# Patient Record
Sex: Female | Born: 1960 | Race: White | Hispanic: No | Marital: Married | Smoking: Former smoker
Health system: Southern US, Community
[De-identification: ages and names within clinical notes are randomized; demographics above are authoritative.]

## PROBLEM LIST (undated history)

## (undated) DIAGNOSIS — R809 Proteinuria, unspecified: Secondary | ICD-10-CM

## (undated) DIAGNOSIS — K219 Gastro-esophageal reflux disease without esophagitis: Secondary | ICD-10-CM

## (undated) DIAGNOSIS — Z8719 Personal history of other diseases of the digestive system: Secondary | ICD-10-CM

## (undated) DIAGNOSIS — M1990 Unspecified osteoarthritis, unspecified site: Secondary | ICD-10-CM

## (undated) DIAGNOSIS — K5900 Constipation, unspecified: Secondary | ICD-10-CM

## (undated) DIAGNOSIS — E669 Obesity, unspecified: Secondary | ICD-10-CM

## (undated) DIAGNOSIS — G4733 Obstructive sleep apnea (adult) (pediatric): Secondary | ICD-10-CM

## (undated) DIAGNOSIS — R059 Cough, unspecified: Secondary | ICD-10-CM

## (undated) DIAGNOSIS — R0683 Snoring: Secondary | ICD-10-CM

## (undated) DIAGNOSIS — J069 Acute upper respiratory infection, unspecified: Secondary | ICD-10-CM

## (undated) DIAGNOSIS — T7840XA Allergy, unspecified, initial encounter: Secondary | ICD-10-CM

## (undated) DIAGNOSIS — E1140 Type 2 diabetes mellitus with diabetic neuropathy, unspecified: Secondary | ICD-10-CM

## (undated) DIAGNOSIS — G629 Polyneuropathy, unspecified: Secondary | ICD-10-CM

## (undated) DIAGNOSIS — E785 Hyperlipidemia, unspecified: Secondary | ICD-10-CM

## (undated) DIAGNOSIS — E119 Type 2 diabetes mellitus without complications: Secondary | ICD-10-CM

## (undated) DIAGNOSIS — J029 Acute pharyngitis, unspecified: Secondary | ICD-10-CM

## (undated) DIAGNOSIS — IMO0001 Reserved for inherently not codable concepts without codable children: Secondary | ICD-10-CM

## (undated) DIAGNOSIS — K121 Other forms of stomatitis: Secondary | ICD-10-CM

## (undated) DIAGNOSIS — D649 Anemia, unspecified: Secondary | ICD-10-CM

## (undated) DIAGNOSIS — I878 Other specified disorders of veins: Secondary | ICD-10-CM

## (undated) DIAGNOSIS — J45909 Unspecified asthma, uncomplicated: Secondary | ICD-10-CM

## (undated) DIAGNOSIS — I2510 Atherosclerotic heart disease of native coronary artery without angina pectoris: Secondary | ICD-10-CM

## (undated) DIAGNOSIS — R011 Cardiac murmur, unspecified: Secondary | ICD-10-CM

## (undated) DIAGNOSIS — I10 Essential (primary) hypertension: Secondary | ICD-10-CM

## (undated) HISTORY — PX: TONSILLECTOMY: SUR1361

## (undated) HISTORY — DX: Hyperlipidemia, unspecified: E78.5

## (undated) HISTORY — DX: Allergy, unspecified, initial encounter: T78.40XA

## (undated) HISTORY — DX: Constipation, unspecified: K59.00

## (undated) HISTORY — DX: Other specified disorders of veins: I87.8

## (undated) HISTORY — DX: Other forms of stomatitis: K12.1

## (undated) HISTORY — DX: Obesity, unspecified: E66.9

## (undated) HISTORY — DX: Snoring: R06.83

## (undated) HISTORY — PX: LAPAROSCOPIC NISSEN FUNDOPLICATION: SHX1932

## (undated) HISTORY — DX: Type 2 diabetes mellitus without complications: E11.9

## (undated) HISTORY — DX: Gastro-esophageal reflux disease without esophagitis: K21.9

## (undated) HISTORY — PX: OTHER SURGICAL HISTORY: SHX169

## (undated) HISTORY — DX: Type 2 diabetes mellitus with diabetic neuropathy, unspecified: E11.40

## (undated) HISTORY — DX: Polyneuropathy, unspecified: G62.9

## (undated) HISTORY — DX: Reserved for inherently not codable concepts without codable children: IMO0001

## (undated) HISTORY — DX: Cardiac murmur, unspecified: R01.1

## (undated) HISTORY — DX: Essential (primary) hypertension: I10

## (undated) HISTORY — DX: Obstructive sleep apnea (adult) (pediatric): G47.33

## (undated) HISTORY — DX: Unspecified asthma, uncomplicated: J45.909

## (undated) HISTORY — DX: Proteinuria, unspecified: R80.9

## (undated) HISTORY — DX: Acute upper respiratory infection, unspecified: J06.9

## (undated) HISTORY — DX: Cough, unspecified: R05.9

## (undated) HISTORY — DX: Acute pharyngitis, unspecified: J02.9

---

## 1998-07-05 ENCOUNTER — Other Ambulatory Visit: Admission: RE | Admit: 1998-07-05 | Discharge: 1998-07-05 | Payer: Self-pay | Admitting: Gastroenterology

## 1998-11-20 HISTORY — PX: THROAT SURGERY: SHX803

## 1998-11-20 HISTORY — PX: LAPAROSCOPIC NISSEN FUNDOPLICATION: SHX1932

## 1998-12-21 ENCOUNTER — Other Ambulatory Visit: Admission: RE | Admit: 1998-12-21 | Discharge: 1998-12-21 | Payer: Self-pay | Admitting: Obstetrics and Gynecology

## 1999-03-28 ENCOUNTER — Ambulatory Visit (HOSPITAL_COMMUNITY): Admission: RE | Admit: 1999-03-28 | Discharge: 1999-03-28 | Payer: Self-pay | Admitting: Gastroenterology

## 1999-05-18 ENCOUNTER — Encounter: Payer: Self-pay | Admitting: Surgery

## 1999-05-19 ENCOUNTER — Inpatient Hospital Stay (HOSPITAL_COMMUNITY): Admission: EM | Admit: 1999-05-19 | Discharge: 1999-05-20 | Payer: Self-pay | Admitting: Surgery

## 2000-01-25 ENCOUNTER — Other Ambulatory Visit: Admission: RE | Admit: 2000-01-25 | Discharge: 2000-01-25 | Payer: Self-pay | Admitting: Obstetrics and Gynecology

## 2000-05-17 ENCOUNTER — Encounter: Payer: Self-pay | Admitting: Surgery

## 2000-05-17 ENCOUNTER — Encounter: Admission: RE | Admit: 2000-05-17 | Discharge: 2000-05-17 | Payer: Self-pay | Admitting: Surgery

## 2001-03-08 ENCOUNTER — Other Ambulatory Visit: Admission: RE | Admit: 2001-03-08 | Discharge: 2001-03-08 | Payer: Self-pay | Admitting: Obstetrics and Gynecology

## 2002-03-25 ENCOUNTER — Other Ambulatory Visit: Admission: RE | Admit: 2002-03-25 | Discharge: 2002-03-25 | Payer: Self-pay | Admitting: Obstetrics and Gynecology

## 2002-04-17 ENCOUNTER — Encounter: Payer: Self-pay | Admitting: Surgery

## 2002-04-17 ENCOUNTER — Encounter: Admission: RE | Admit: 2002-04-17 | Discharge: 2002-04-17 | Payer: Self-pay | Admitting: Surgery

## 2003-04-07 ENCOUNTER — Other Ambulatory Visit: Admission: RE | Admit: 2003-04-07 | Discharge: 2003-04-07 | Payer: Self-pay | Admitting: Obstetrics and Gynecology

## 2004-03-22 ENCOUNTER — Other Ambulatory Visit: Admission: RE | Admit: 2004-03-22 | Discharge: 2004-03-22 | Payer: Self-pay | Admitting: Obstetrics and Gynecology

## 2005-05-10 ENCOUNTER — Other Ambulatory Visit: Admission: RE | Admit: 2005-05-10 | Discharge: 2005-05-10 | Payer: Self-pay | Admitting: Obstetrics and Gynecology

## 2007-09-13 ENCOUNTER — Encounter: Admission: RE | Admit: 2007-09-13 | Discharge: 2007-09-13 | Payer: Self-pay | Admitting: Surgery

## 2009-05-06 ENCOUNTER — Emergency Department (HOSPITAL_COMMUNITY): Admission: EM | Admit: 2009-05-06 | Discharge: 2009-05-06 | Payer: Self-pay | Admitting: Emergency Medicine

## 2011-02-27 LAB — URINALYSIS, ROUTINE W REFLEX MICROSCOPIC
Bilirubin Urine: NEGATIVE
Glucose, UA: NEGATIVE mg/dL
Hgb urine dipstick: NEGATIVE
Ketones, ur: NEGATIVE mg/dL
Leukocytes, UA: NEGATIVE
Nitrite: NEGATIVE
Protein, ur: NEGATIVE mg/dL
Specific Gravity, Urine: 1.024 (ref 1.005–1.030)
Urobilinogen, UA: 1 mg/dL (ref 0.0–1.0)
pH: 7.5 (ref 5.0–8.0)

## 2011-02-27 LAB — URINE MICROSCOPIC-ADD ON

## 2011-02-27 LAB — COMPREHENSIVE METABOLIC PANEL
ALT: 11 U/L (ref 0–35)
Calcium: 8.9 mg/dL (ref 8.4–10.5)
Glucose, Bld: 136 mg/dL — ABNORMAL HIGH (ref 70–99)
Sodium: 135 mEq/L (ref 135–145)
Total Protein: 5.9 g/dL — ABNORMAL LOW (ref 6.0–8.3)

## 2011-02-27 LAB — CBC
Hemoglobin: 11.4 g/dL — ABNORMAL LOW (ref 12.0–15.0)
MCHC: 33.4 g/dL (ref 30.0–36.0)
Platelets: 244 10*3/uL (ref 150–400)
RDW: 12.5 % (ref 11.5–15.5)

## 2011-02-27 LAB — DIFFERENTIAL
Lymphs Abs: 0.8 10*3/uL (ref 0.7–4.0)
Monocytes Relative: 6 % (ref 3–12)
Neutro Abs: 4.1 10*3/uL (ref 1.7–7.7)
Neutrophils Relative %: 77 % (ref 43–77)

## 2011-06-27 ENCOUNTER — Other Ambulatory Visit: Payer: Self-pay | Admitting: Family Medicine

## 2011-06-29 ENCOUNTER — Ambulatory Visit (HOSPITAL_COMMUNITY)
Admission: RE | Admit: 2011-06-29 | Discharge: 2011-06-29 | Disposition: A | Discharge status: Home / Self Care | Payer: BC Managed Care – PPO | Source: Ambulatory Visit | Attending: Family Medicine | Admitting: Family Medicine

## 2011-06-29 ENCOUNTER — Other Ambulatory Visit: Payer: Self-pay | Admitting: Family Medicine

## 2011-06-29 DIAGNOSIS — K769 Liver disease, unspecified: Secondary | ICD-10-CM | POA: Insufficient documentation

## 2011-06-29 DIAGNOSIS — R1011 Right upper quadrant pain: Secondary | ICD-10-CM

## 2011-06-29 DIAGNOSIS — R109 Unspecified abdominal pain: Secondary | ICD-10-CM | POA: Insufficient documentation

## 2011-07-03 ENCOUNTER — Encounter (HOSPITAL_COMMUNITY)
Admission: RE | Admit: 2011-07-03 | Discharge: 2011-07-03 | Disposition: A | Discharge status: Home / Self Care | Payer: BC Managed Care – PPO | Source: Ambulatory Visit | Attending: Family Medicine | Admitting: Family Medicine

## 2011-07-03 DIAGNOSIS — R1011 Right upper quadrant pain: Secondary | ICD-10-CM | POA: Insufficient documentation

## 2011-07-03 MED ORDER — SINCALIDE 5 MCG IJ SOLR
0.0200 ug/kg | Freq: Once | INTRAMUSCULAR | Status: AC
  Administered 2011-07-03: 1.95 ug via INTRAVENOUS

## 2011-07-03 MED ORDER — TECHNETIUM TC 99M MEBROFENIN IV KIT
5.0000 | PACK | Freq: Once | INTRAVENOUS | Status: AC | PRN
  Administered 2011-07-03: 5.26 via INTRAVENOUS

## 2013-02-24 ENCOUNTER — Other Ambulatory Visit: Payer: Self-pay | Admitting: Family Medicine

## 2013-02-26 ENCOUNTER — Encounter: Payer: Self-pay | Admitting: Family Medicine

## 2013-02-26 ENCOUNTER — Ambulatory Visit (INDEPENDENT_AMBULATORY_CARE_PROVIDER_SITE_OTHER): Payer: BC Managed Care – PPO | Admitting: Family Medicine

## 2013-02-26 VITALS — BP 138/78 | Temp 98.6°F | Wt 222.0 lb

## 2013-02-26 DIAGNOSIS — E119 Type 2 diabetes mellitus without complications: Secondary | ICD-10-CM | POA: Insufficient documentation

## 2013-02-26 DIAGNOSIS — J45901 Unspecified asthma with (acute) exacerbation: Secondary | ICD-10-CM | POA: Insufficient documentation

## 2013-02-26 MED ORDER — FLUTICASONE PROPIONATE 50 MCG/ACT NA SUSP: 2.0000 | Freq: Every day | NASAL | Status: DC

## 2013-02-26 MED ORDER — GLUCOSE BLOOD VI STRP: ORAL_STRIP | Status: AC

## 2013-02-26 MED ORDER — CEFPROZIL 500 MG PO TABS: 500.0000 mg | ORAL_TABLET | Freq: Two times a day (BID) | ORAL | Status: DC

## 2013-02-26 MED ORDER — PREDNISONE 20 MG PO TABS: ORAL_TABLET | ORAL | Status: AC

## 2013-02-26 NOTE — Progress Notes (Signed)
  Subjective:    Patient ID: Lisa Burns, female    DOB: 06-21-1961, 52 y.o.   MRN: [id number]  Wheezing  This is a recurrent problem. The current episode started in the past 7 days. The problem occurs 2 to 4 times per day. The problem has been gradually worsening. Associated symptoms include sputum production. Pertinent negatives include no ear pain or fever. Nothing aggravates the symptoms. She has tried nothing for the symptoms. The treatment provided mild relief. Her past medical history is significant for asthma.   Allergies also appeared to be flaring up.   Review of Systems  Constitutional: Negative for fever.  HENT: Negative for ear pain.   Respiratory: Positive for sputum production and wheezing.    ROS otherwise negative.     Objective:   Physical Exam  Vital signs stable. Alert no acute distress. Lungs bilateral wheezes. Heart regular rate rhythm. HET moderate nasal and frontal tenderness.      Assessment & Plan:  Impression #1 flare of asthma. #2 sinusitis. Plan Cefzil 500 twice a day for 10 days. Prednisone taper. Patient encouraged return for diabetes evaluation. WSL

## 2013-02-26 NOTE — Patient Instructions (Signed)
Diabetes visit later in spring please

## 2013-03-12 ENCOUNTER — Other Ambulatory Visit: Payer: Self-pay | Admitting: Family Medicine

## 2013-03-23 ENCOUNTER — Other Ambulatory Visit: Payer: Self-pay | Admitting: Family Medicine

## 2013-03-30 ENCOUNTER — Other Ambulatory Visit: Payer: Self-pay | Admitting: Family Medicine

## 2013-04-02 ENCOUNTER — Encounter: Payer: Self-pay | Admitting: *Deleted

## 2013-04-29 ENCOUNTER — Ambulatory Visit (INDEPENDENT_AMBULATORY_CARE_PROVIDER_SITE_OTHER): Payer: BC Managed Care – PPO | Admitting: Family Medicine

## 2013-04-29 ENCOUNTER — Encounter: Payer: Self-pay | Admitting: Family Medicine

## 2013-04-29 VITALS — BP 130/90 | HR 80 | Wt 220.0 lb

## 2013-04-29 DIAGNOSIS — E1165 Type 2 diabetes mellitus with hyperglycemia: Secondary | ICD-10-CM

## 2013-04-29 DIAGNOSIS — Z79899 Other long term (current) drug therapy: Secondary | ICD-10-CM

## 2013-04-29 DIAGNOSIS — E782 Mixed hyperlipidemia: Secondary | ICD-10-CM | POA: Insufficient documentation

## 2013-04-29 DIAGNOSIS — I10 Essential (primary) hypertension: Secondary | ICD-10-CM

## 2013-04-29 DIAGNOSIS — E119 Type 2 diabetes mellitus without complications: Secondary | ICD-10-CM

## 2013-04-29 DIAGNOSIS — E785 Hyperlipidemia, unspecified: Secondary | ICD-10-CM

## 2013-04-29 DIAGNOSIS — G609 Hereditary and idiopathic neuropathy, unspecified: Secondary | ICD-10-CM

## 2013-04-29 DIAGNOSIS — R809 Proteinuria, unspecified: Secondary | ICD-10-CM

## 2013-04-29 DIAGNOSIS — E1129 Type 2 diabetes mellitus with other diabetic kidney complication: Secondary | ICD-10-CM

## 2013-04-29 DIAGNOSIS — Z794 Long term (current) use of insulin: Secondary | ICD-10-CM | POA: Insufficient documentation

## 2013-04-29 DIAGNOSIS — IMO0001 Reserved for inherently not codable concepts without codable children: Secondary | ICD-10-CM

## 2013-04-29 LAB — POCT GLYCOSYLATED HEMOGLOBIN (HGB A1C): Hemoglobin A1C: 10.4

## 2013-04-29 MED ORDER — GLYBURIDE 5 MG PO TABS: ORAL_TABLET | ORAL | Status: DC

## 2013-04-29 MED ORDER — NORTRIPTYLINE HCL 25 MG PO CAPS: 25.0000 mg | ORAL_CAPSULE | Freq: Every day | ORAL | Status: DC

## 2013-04-29 NOTE — Progress Notes (Signed)
  Subjective:    Patient ID: Lisa Burns, female    DOB: 08-25-1961, 52 y.o.   MRN: [id number]  Diabetes She has type 2 diabetes mellitus. Her disease course has been worsening. Pertinent negatives for diabetes include no blurred vision and no chest pain. Symptoms are worsening. Diabetic complications include peripheral neuropathy. There are no known risk factors for coronary artery disease. She is compliant with treatment most of the time. Her weight is stable. Her home blood glucose trend is increasing steadily. Her breakfast blood glucose is taken between 7-8 am. Her breakfast blood glucose range is generally 180-200 mg/dl.   Patient reports considerable difficulty with fatigue and tiredness. Reports her wheezing has improved considerably.  Patient reports a lot of difficulty with foot pain. Bilateral. Felt to be related to neuropathy. Started on gabapentin by podiatrist which did not seem to help.  Patient states reflux is stable as long as she sticks with medications. Results for orders placed in visit on 04/29/13  POCT GLYCOSYLATED HEMOGLOBIN (HGB A1C)      Result Value Range   Hemoglobin A1C 10.4       Review of Systems  Eyes: Negative for blurred vision.  Cardiovascular: Negative for chest pain.       Objective:   Physical Exam  Alert mild malaise. Vital stable. Lungs clear. Heart regular in rhythm. HEENT normal. Feet trace edema. Sensation diminished distally. Pulses good.      Assessment & Plan:  Impression #1 type 2 diabetes poor control discussed at length. #2 diabetic neuropathy with very significant symptoms discussed. #3 history hyperlipidemia. #4 micro-proteinuria discussed. #5 hypertension discussed. Plan appropriate blood work. Increase glyburide as noted. Recheck in several months.

## 2013-04-29 NOTE — Patient Instructions (Addendum)
Please call us in two weeks with fasting numbers!!!!!

## 2013-05-02 ENCOUNTER — Other Ambulatory Visit: Payer: Self-pay | Admitting: Family Medicine

## 2013-06-19 ENCOUNTER — Ambulatory Visit (INDEPENDENT_AMBULATORY_CARE_PROVIDER_SITE_OTHER): Payer: BC Managed Care – PPO | Admitting: Nurse Practitioner

## 2013-06-19 ENCOUNTER — Encounter: Payer: Self-pay | Admitting: Nurse Practitioner

## 2013-06-19 VITALS — BP 134/90 | HR 80 | Temp 98.5°F | Wt 220.6 lb

## 2013-06-19 DIAGNOSIS — J069 Acute upper respiratory infection, unspecified: Secondary | ICD-10-CM

## 2013-06-19 DIAGNOSIS — J45901 Unspecified asthma with (acute) exacerbation: Secondary | ICD-10-CM

## 2013-06-19 DIAGNOSIS — J4521 Mild intermittent asthma with (acute) exacerbation: Secondary | ICD-10-CM

## 2013-06-19 MED ORDER — CEFPROZIL 500 MG PO TABS: 500.0000 mg | ORAL_TABLET | Freq: Two times a day (BID) | ORAL | Status: DC

## 2013-06-19 MED ORDER — PREDNISONE 20 MG PO TABS: ORAL_TABLET | ORAL | Status: DC

## 2013-06-21 NOTE — Progress Notes (Signed)
Subjective:  Presents complaints of a flareup of her asthma for the past 4 days. Loss of voice. Slight shortness of breath with activity. Using her albuterol inhaler to 3 times per day which helps some. Wheezing at times. Cough is now producing green sputum. Runny nose. Sore throat. No ear pain. Facial area headache. Some relief with Mucinex DM. Compliant with her asthma medications see chart.  Objective:   BP 134/90  Pulse 80  Temp(Src) 98.5 F (36.9 C)  Wt 220 lb 9.6 oz (100.064 kg) NAD. Alert, oriented. TMs clear effusion, no erythema. Pharynx mildly injected with green PND noted. Neck supple with mild soft slightly tender adenopathy. Lungs 1 faint expiratory wheeze noted posterior left side, otherwise clear. No tachypnea. Normal color. Heart regular rate rhythm.  Assessment:Acute upper respiratory infection  Asthma with acute exacerbation, mild intermittent  Plan: Meds ordered this encounter  Medications  . predniSONE (DELTASONE) 20 MG tablet    Sig: 3 po qd x 3 d then 2 po qd x 3 d then 1 po qd x 3 d    Dispense:  18 tablet    Refill:  0    Order Specific Question:  Supervising Provider    Answer:  Merlyn Albert [[id number]]  . cefPROZIL (CEFZIL) 500 MG tablet    Sig: Take 1 tablet (500 mg total) by mouth 2 (two) times daily.    Dispense:  20 tablet    Refill:  0    Order Specific Question:  Supervising Provider    Answer:  Merlyn Albert [[id number]]   Continue current meds as directed. Continue Mucinex DM for cough. Callback in 4-5 days if no improvement, call or go to ED sooner if worse.

## 2013-06-21 NOTE — Assessment & Plan Note (Signed)
Plan: Meds ordered this encounter  Medications  . predniSONE (DELTASONE) 20 MG tablet    Sig: 3 po qd x 3 d then 2 po qd x 3 d then 1 po qd x 3 d    Dispense:  18 tablet    Refill:  0    Order Specific Question:  Supervising Provider    Answer:  Merlyn Albert [[id number]]  . cefPROZIL (CEFZIL) 500 MG tablet    Sig: Take 1 tablet (500 mg total) by mouth 2 (two) times daily.    Dispense:  20 tablet    Refill:  0    Order Specific Question:  Supervising Provider    Answer:  Merlyn Albert [[id number]]   Continue current meds as directed. Continue Mucinex DM for cough. Callback in 4-5 days if no improvement, call or go to ED sooner if worse.

## 2013-06-22 ENCOUNTER — Other Ambulatory Visit: Payer: Self-pay | Admitting: Family Medicine

## 2013-07-20 ENCOUNTER — Other Ambulatory Visit: Payer: Self-pay | Admitting: Family Medicine

## 2013-08-15 ENCOUNTER — Telehealth: Payer: Self-pay | Admitting: Family Medicine

## 2013-08-15 NOTE — Telephone Encounter (Signed)
error 

## 2013-08-17 ENCOUNTER — Other Ambulatory Visit: Payer: Self-pay | Admitting: Family Medicine

## 2013-08-18 ENCOUNTER — Ambulatory Visit: Payer: BC Managed Care – PPO | Admitting: Family Medicine

## 2013-08-19 ENCOUNTER — Other Ambulatory Visit: Payer: Self-pay | Admitting: Family Medicine

## 2013-08-19 LAB — HEPATIC FUNCTION PANEL
ALT: 19 U/L (ref 0–35)
AST: 16 U/L (ref 0–37)
Albumin: 3.7 g/dL (ref 3.5–5.2)
Alkaline Phosphatase: 66 U/L (ref 39–117)
Indirect Bilirubin: 0.3 mg/dL (ref 0.0–0.9)
Total Protein: 5.8 g/dL — ABNORMAL LOW (ref 6.0–8.3)

## 2013-08-19 LAB — BASIC METABOLIC PANEL
CO2: 26 mEq/L (ref 19–32)
Chloride: 101 mEq/L (ref 96–112)
Sodium: 139 mEq/L (ref 135–145)

## 2013-08-19 LAB — LIPID PANEL
Cholesterol: 220 mg/dL — ABNORMAL HIGH (ref 0–200)
HDL: 41 mg/dL (ref >39)
Triglycerides: 575 mg/dL — ABNORMAL HIGH (ref <150)

## 2013-08-27 ENCOUNTER — Ambulatory Visit (INDEPENDENT_AMBULATORY_CARE_PROVIDER_SITE_OTHER): Payer: BC Managed Care – PPO | Admitting: Family Medicine

## 2013-08-27 ENCOUNTER — Encounter: Payer: Self-pay | Admitting: Family Medicine

## 2013-08-27 ENCOUNTER — Telehealth: Payer: Self-pay | Admitting: Family Medicine

## 2013-08-27 VITALS — BP 138/82 | Ht 60.0 in | Wt 219.6 lb

## 2013-08-27 DIAGNOSIS — E785 Hyperlipidemia, unspecified: Secondary | ICD-10-CM

## 2013-08-27 DIAGNOSIS — I10 Essential (primary) hypertension: Secondary | ICD-10-CM

## 2013-08-27 DIAGNOSIS — G609 Hereditary and idiopathic neuropathy, unspecified: Secondary | ICD-10-CM

## 2013-08-27 DIAGNOSIS — E119 Type 2 diabetes mellitus without complications: Secondary | ICD-10-CM

## 2013-08-27 DIAGNOSIS — J45901 Unspecified asthma with (acute) exacerbation: Secondary | ICD-10-CM

## 2013-08-27 MED ORDER — GABAPENTIN 400 MG PO CAPS: 400.0000 mg | ORAL_CAPSULE | Freq: Three times a day (TID) | ORAL | Status: DC

## 2013-08-27 MED ORDER — CANAGLIFLOZIN 100 MG PO TABS: 100.0000 mg | ORAL_TABLET | Freq: Every day | ORAL | Status: DC

## 2013-08-27 MED ORDER — AZITHROMYCIN 250 MG PO TABS: ORAL_TABLET | ORAL | Status: DC

## 2013-08-27 NOTE — Patient Instructions (Signed)
Call us in 21 d with glu numbers

## 2013-08-27 NOTE — Telephone Encounter (Signed)
Definitely not, stay on both plz

## 2013-08-27 NOTE — Progress Notes (Signed)
  Subjective:    Patient ID: Lisa Burns, female    DOB: Sep 03, 1961, 52 y.o.   MRN: [id number]  Diabetes She has type 2 diabetes mellitus. Hypoglycemia symptoms include mood changes. Pertinent negatives for hypoglycemia include no confusion. Pertinent negatives for hypoglycemia complications include no blackouts. Symptoms are worsening. She is compliant with treatment most of the time. Her weight is increasing steadily. Meal planning includes avoidance of concentrated sweets. She has not had a previous visit with a dietician. Her home blood glucose trend is increasing steadily. Her breakfast blood glucose range is generally 140-180 mg/dl. An ACE inhibitor/angiotensin II receptor blocker is not being taken. Eye exam is not current.   Patient is here today for diabetic check up. She states her blood sugars have been elevated.  Results for orders placed in visit on 08/27/13  POCT GLYCOSYLATED HEMOGLOBIN (HGB A1C)      Result Value Range   Hemoglobin A1C 9.6     Patient claims compliance with all of her blood pressure medication. Trying to watch her diet. Cut down her salt intake. Not exercising very much.  Reports ongoing pain in her feet from the neuropathy. States the gabapentin has helped like to try a higher dose.  No low sugar spells.  Review of Systems  Psychiatric/Behavioral: Negative for confusion.   No chest pain no abdominal pain no change in bowel habits    Objective:   Physical Exam  Alert no acute distress. Blood pressure good on repeat 130/82. H&T normal. Lungs clear. Heart regular rate and rhythm.      Assessment & Plan:  Assessment #1 type 2 diabetes poor control discussed. #2 asthma clinically stable. #3 hypertension stable. #4 diabetic neuropathy discussed. Plan increase Neurontin to 400 mg 3 times a day. Maintain Pamelor. Diet exercise discussed. Patient declines flu shot and pneumonia shot. Of note patient could not take metformin due to diarrhea. Intercondylar  started 100 mg daily rationale discussed 35 minutes spent most in discussion documentation

## 2013-08-27 NOTE — Telephone Encounter (Signed)
Should patient stop the Glyburide since you started her on Invokanna (?sp)  Please advise

## 2013-08-28 NOTE — Telephone Encounter (Signed)
Discussed with patient

## 2013-09-04 ENCOUNTER — Telehealth: Payer: Self-pay | Admitting: Family Medicine

## 2013-09-04 NOTE — Telephone Encounter (Signed)
error 

## 2013-09-16 ENCOUNTER — Telehealth: Payer: Self-pay | Admitting: *Deleted

## 2013-09-16 NOTE — Telephone Encounter (Signed)
Pt's insurance denied invokana. Dr. Brett Canales recommends an appt to discuss alternative. Pt made an appt.

## 2013-09-23 ENCOUNTER — Other Ambulatory Visit: Payer: Self-pay | Admitting: Family Medicine

## 2013-09-26 ENCOUNTER — Ambulatory Visit (INDEPENDENT_AMBULATORY_CARE_PROVIDER_SITE_OTHER): Payer: BC Managed Care – PPO | Admitting: Family Medicine

## 2013-09-26 ENCOUNTER — Encounter: Payer: Self-pay | Admitting: Family Medicine

## 2013-09-26 VITALS — BP 122/86 | Ht 60.0 in | Wt 213.8 lb

## 2013-09-26 DIAGNOSIS — E119 Type 2 diabetes mellitus without complications: Secondary | ICD-10-CM

## 2013-09-26 DIAGNOSIS — G609 Hereditary and idiopathic neuropathy, unspecified: Secondary | ICD-10-CM

## 2013-09-26 MED ORDER — SITAGLIPTIN PHOSPHATE 100 MG PO TABS: 100.0000 mg | ORAL_TABLET | Freq: Every day | ORAL | Status: DC

## 2013-09-26 MED ORDER — NORTRIPTYLINE HCL 50 MG PO CAPS: 50.0000 mg | ORAL_CAPSULE | Freq: Every day | ORAL | Status: DC

## 2013-09-26 NOTE — Progress Notes (Signed)
  Subjective:    Patient ID: Lisa Burns, female    DOB: 27-Feb-1961, 52 y.o.   MRN: [id number]  HPI  Patient is here today to discuss a different diabetic medication. She was prescribed Invokana, but her insurance does not cover it. She did take it for 2 weeks, and her blood sugars still remained elevated.   Patient stated the inokana, really did not help. Patient tried to watch her diet. Compliant with medication. Besides insurance would not pay for it anyway. Reports her sugars still in the 200 range.  Reports her nighttime peripheral neuropathy is worsening a lot of burning and discomfort.   Review of Systems No fever no chills no headache no chest pain no back pain ROS otherwise negative    Objective:   Physical Exam  Alert lungs clear heart regular in rhythm H&T normal feet sensation diminished hypersensitive pulses good      Assessment & Plan:  Impression 1 type 2 diabetes suboptimum control #2 peripheral neuropathy. Plan increase Pamelor to 50 each bedtime. Stop invokana, start Januvia 100 mg daily. Rationale discussed. Followup in several months. WSL

## 2013-09-29 ENCOUNTER — Other Ambulatory Visit: Payer: Self-pay | Admitting: Family Medicine

## 2013-10-08 ENCOUNTER — Telehealth: Payer: Self-pay | Admitting: Family Medicine

## 2013-10-08 DIAGNOSIS — E119 Type 2 diabetes mellitus without complications: Secondary | ICD-10-CM

## 2013-10-08 NOTE — Telephone Encounter (Signed)
Referral initiated in the system. 

## 2013-10-08 NOTE — Telephone Encounter (Signed)
Patient called requesting referral to Dr. Fransico Him for her diabetes.  States she started new medicine about a week ago and sugars are still in the 300's.  Please advise.  If "ok" please initiate referral in system so I may process

## 2013-10-08 NOTE — Telephone Encounter (Signed)
Let her know that's fine and be prepared for them to rec insulin, which is likely next step

## 2013-12-01 ENCOUNTER — Ambulatory Visit: Payer: BC Managed Care – PPO | Admitting: Family Medicine

## 2013-12-30 ENCOUNTER — Encounter: Payer: Self-pay | Admitting: Family Medicine

## 2013-12-30 ENCOUNTER — Ambulatory Visit (INDEPENDENT_AMBULATORY_CARE_PROVIDER_SITE_OTHER): Payer: BC Managed Care – PPO | Admitting: Family Medicine

## 2013-12-30 VITALS — BP 136/84 | Ht 60.0 in | Wt 202.0 lb

## 2013-12-30 DIAGNOSIS — I10 Essential (primary) hypertension: Secondary | ICD-10-CM

## 2013-12-30 DIAGNOSIS — G609 Hereditary and idiopathic neuropathy, unspecified: Secondary | ICD-10-CM

## 2013-12-30 DIAGNOSIS — E785 Hyperlipidemia, unspecified: Secondary | ICD-10-CM

## 2013-12-30 DIAGNOSIS — J45901 Unspecified asthma with (acute) exacerbation: Secondary | ICD-10-CM

## 2013-12-30 DIAGNOSIS — Z23 Encounter for immunization: Secondary | ICD-10-CM

## 2013-12-30 MED ORDER — FUROSEMIDE 40 MG PO TABS: ORAL_TABLET | ORAL | Status: DC

## 2013-12-30 MED ORDER — ENALAPRIL MALEATE 10 MG PO TABS: ORAL_TABLET | ORAL | Status: DC

## 2013-12-30 MED ORDER — LANSOPRAZOLE 15 MG PO CPDR: 15.0000 mg | DELAYED_RELEASE_CAPSULE | Freq: Every day | ORAL | Status: DC

## 2013-12-30 MED ORDER — ATORVASTATIN CALCIUM 20 MG PO TABS: 20.0000 mg | ORAL_TABLET | Freq: Every day | ORAL | Status: DC

## 2013-12-30 MED ORDER — GABAPENTIN 400 MG PO CAPS: 400.0000 mg | ORAL_CAPSULE | Freq: Three times a day (TID) | ORAL | Status: DC

## 2013-12-30 MED ORDER — BUDESONIDE 180 MCG/ACT IN AEPB: INHALATION_SPRAY | RESPIRATORY_TRACT | Status: DC

## 2013-12-30 NOTE — Progress Notes (Signed)
   Subjective:    Patient ID: Lisa Burns, female    DOB: 05/24/1961, 53 y.o.   MRN: [id number]  HPI Patient arrives for a follow up on blood pressure and cholesterol. BP numbers are generally good.  Compliant with blood pressure medicine. Watch her salt intake. Exercising. In fact has lost 20 pounds.  Compliant with cholesterol medicine. No obvious side effects. Watching fat in her diet. Exercising and dieting regularly.  Compliant with asthma medication. On the Pulmicort. Notes rare use of rescue inhaler.  Ongoing burning of feet. Improved the sugar improves. Compliant with Pamelor but uses gabapentin Selinda Eon when necessary basis.  urrently sees Dr. Dorris Fetch for diabetes.  Working hard on diet Review of Systems No headache or chest pain no back pain no abdominal pain positive bilateral foot pain in the evening numbness and tingling at times. ROS otherwise negative       Objective:   Physical Exam Alert no apparent distress. Vitals reviewed and stable. HEENT normal. Lungs clear. Heart regular in rhythm. Ankles trace edema. Pulses arterial pulses excellent. Sensation slightly diminished distally.       Assessment & Plan:  Impression 1 hypertension good control. #2 hyperlipidemia discussed to for blood work shortly. #3 asthma clinically stable discussed #4 neuropathy ongoing. Though somewhat improved symptomatically. Plan diet exercise discussed. Maintain same medications. Recheck in 6 months. Blood work from Dr. Laverta Baltimore. Pneumonia shot. WSL

## 2014-04-03 ENCOUNTER — Other Ambulatory Visit: Payer: Self-pay | Admitting: Family Medicine

## 2014-08-17 ENCOUNTER — Other Ambulatory Visit: Payer: Self-pay | Admitting: Family Medicine

## 2014-08-24 ENCOUNTER — Other Ambulatory Visit: Payer: Self-pay | Admitting: Family Medicine

## 2014-09-23 ENCOUNTER — Ambulatory Visit (INDEPENDENT_AMBULATORY_CARE_PROVIDER_SITE_OTHER): Payer: BC Managed Care – PPO | Admitting: Family Medicine

## 2014-09-23 ENCOUNTER — Encounter: Payer: Self-pay | Admitting: Family Medicine

## 2014-09-23 VITALS — BP 128/74 | Ht 60.0 in | Wt 212.0 lb

## 2014-09-23 DIAGNOSIS — G609 Hereditary and idiopathic neuropathy, unspecified: Secondary | ICD-10-CM

## 2014-09-23 DIAGNOSIS — J4521 Mild intermittent asthma with (acute) exacerbation: Secondary | ICD-10-CM

## 2014-09-23 DIAGNOSIS — I10 Essential (primary) hypertension: Secondary | ICD-10-CM

## 2014-09-23 DIAGNOSIS — E785 Hyperlipidemia, unspecified: Secondary | ICD-10-CM

## 2014-09-23 MED ORDER — NORTRIPTYLINE HCL 50 MG PO CAPS: 50.0000 mg | ORAL_CAPSULE | Freq: Every day | ORAL | Status: DC

## 2014-09-23 MED ORDER — FUROSEMIDE 40 MG PO TABS: ORAL_TABLET | ORAL | Status: DC

## 2014-09-23 MED ORDER — ENALAPRIL MALEATE 10 MG PO TABS: ORAL_TABLET | ORAL | Status: DC

## 2014-09-23 MED ORDER — LANSOPRAZOLE 15 MG PO CPDR: 15.0000 mg | DELAYED_RELEASE_CAPSULE | Freq: Every day | ORAL | Status: DC

## 2014-09-23 MED ORDER — GABAPENTIN 400 MG PO CAPS: ORAL_CAPSULE | ORAL | Status: DC

## 2014-09-23 NOTE — Progress Notes (Signed)
   Subjective:    Patient ID: Lisa Burns, female    DOB: 05-19-1961, 53 y.o.   MRN: [id number]  HPI Patient is here today for a 6 month check up.  She sees Dr. Dorris Fetch for her diabetes. Her A1C on 09/16/14 was 6.9   No concerns.  Needs refills on her meds.  pts mom had stroke and heart atack  Bp cked good elsewhere  Diet ooverall better  Watching diet well  bp good  Decline flu shot  Minimal wheezing just a couple of times Overall breathing very stable   Handling the meds well   nortryptilineis still helping the discomfort from the sensory neuropathy.  Exercising regularly  Prev ck ups each yr  Not taking lipitor , patient was having side effects from it so Dr. Laverta Baltimore stopped      Review of Systems No headache no chest pain no back pain abdominal pain no change in bowel habits no blood in stool ROS otherwise negative    Objective:   Physical Exam Alert no apparent distress. HEENT normal. Lungs clear. Heart regular in rhythm. Ankles without edema.       Assessment & Plan:  Impression 1 hypertension good control. #2 painful diabetic neuropathy clinically stable. #3 asthma clinically stable. #4 diabetes management per specialist plan diet exercise discussed. Maintain same medications. Warning signs discussed. Check every 6 months. WSL

## 2014-10-08 ENCOUNTER — Other Ambulatory Visit: Payer: Self-pay | Admitting: Family Medicine

## 2014-11-19 ENCOUNTER — Other Ambulatory Visit: Payer: Self-pay | Admitting: Family Medicine

## 2014-11-19 ENCOUNTER — Other Ambulatory Visit: Payer: Self-pay | Admitting: *Deleted

## 2014-11-19 MED ORDER — FLUTICASONE PROPIONATE 50 MCG/ACT NA SUSP: 1.0000 | Freq: Every day | NASAL | Status: DC

## 2015-02-22 ENCOUNTER — Ambulatory Visit (INDEPENDENT_AMBULATORY_CARE_PROVIDER_SITE_OTHER): Payer: BLUE CROSS/BLUE SHIELD | Admitting: Family Medicine

## 2015-02-22 ENCOUNTER — Encounter: Payer: Self-pay | Admitting: Family Medicine

## 2015-02-22 VITALS — BP 110/72 | Temp 98.3°F | Ht 60.0 in | Wt 212.0 lb

## 2015-02-22 DIAGNOSIS — J329 Chronic sinusitis, unspecified: Secondary | ICD-10-CM | POA: Diagnosis not present

## 2015-02-22 MED ORDER — CEFDINIR 300 MG PO CAPS: 300.0000 mg | ORAL_CAPSULE | Freq: Two times a day (BID) | ORAL | Status: DC

## 2015-02-22 NOTE — Progress Notes (Signed)
   Subjective:    Patient ID: Lisa Burns, female    DOB: 10-22-1961, 54 y.o.   MRN: [id number]  Sinusitis This is a new problem. The current episode started in the past 7 days. Associated symptoms include ear pain and headaches. (Runny nose) Treatments tried: OTC sinus relief, loratadine, pulmicort.    Coughing and feeling bad  Frontal headache  sore  Throat discomfrt  Left ear painful    Review of Systems  HENT: Positive for ear pain.   Neurological: Positive for headaches.       Objective:   Physical Exam  Alert moderate malaise. H&T moderate his congestion frontal tenderness. Pharynx erythematous left tympanic membrane retracted lungs clear. Heart regular in rhythm.      Assessment & Plan:  Impression acute rhinosinusitis with domino left otitis media plan antibiotics prescribed. Symptomatic care discussed. Warning signs discussed WSL

## 2015-03-23 ENCOUNTER — Other Ambulatory Visit: Payer: Self-pay | Admitting: Family Medicine

## 2015-04-06 ENCOUNTER — Other Ambulatory Visit: Payer: Self-pay | Admitting: Family Medicine

## 2015-04-20 ENCOUNTER — Other Ambulatory Visit: Payer: Self-pay | Admitting: Family Medicine

## 2015-04-30 ENCOUNTER — Other Ambulatory Visit: Payer: Self-pay | Admitting: Family Medicine

## 2015-04-30 NOTE — Telephone Encounter (Signed)
Needs office visit.

## 2015-05-19 ENCOUNTER — Other Ambulatory Visit: Payer: Self-pay | Admitting: Family Medicine

## 2015-06-14 ENCOUNTER — Other Ambulatory Visit: Payer: Self-pay | Admitting: Family Medicine

## 2015-06-18 ENCOUNTER — Ambulatory Visit: Payer: BLUE CROSS/BLUE SHIELD | Admitting: Family Medicine

## 2015-06-21 ENCOUNTER — Other Ambulatory Visit: Payer: Self-pay | Admitting: Family Medicine

## 2015-07-01 ENCOUNTER — Telehealth: Payer: Self-pay | Admitting: *Deleted

## 2015-07-01 NOTE — Telephone Encounter (Signed)
rx request from [facility] for diflucan 150mg  #2 take one dose now then repeat in 3 days. [facility] with pt to get symptoms.

## 2015-07-02 ENCOUNTER — Other Ambulatory Visit: Payer: Self-pay | Admitting: *Deleted

## 2015-07-02 ENCOUNTER — Other Ambulatory Visit: Payer: Self-pay

## 2015-07-02 MED ORDER — FLUCONAZOLE 150 MG PO TABS: ORAL_TABLET | ORAL | Status: DC

## 2015-07-02 NOTE — Telephone Encounter (Signed)
Pt having white vaginal discharge and itching. diflcan 150mg  #2 one po 3 days apart sent to pharm. Pt notified ov if symptoms do not clear up.

## 2015-07-05 ENCOUNTER — Other Ambulatory Visit: Payer: Self-pay | Admitting: Family Medicine

## 2015-07-05 MED ORDER — FLUCONAZOLE 150 MG PO TABS: ORAL_TABLET | ORAL | Status: DC

## 2015-07-05 NOTE — Addendum Note (Signed)
Addended by: Jesusita Oka on: 07/05/2015 05:07 PM   Modules accepted: Orders

## 2015-07-06 ENCOUNTER — Ambulatory Visit (INDEPENDENT_AMBULATORY_CARE_PROVIDER_SITE_OTHER): Payer: BLUE CROSS/BLUE SHIELD | Admitting: Family Medicine

## 2015-07-06 ENCOUNTER — Encounter: Payer: Self-pay | Admitting: Family Medicine

## 2015-07-06 VITALS — BP 120/78 | Temp 98.5°F | Ht 60.0 in | Wt 213.2 lb

## 2015-07-06 DIAGNOSIS — J329 Chronic sinusitis, unspecified: Secondary | ICD-10-CM

## 2015-07-06 DIAGNOSIS — J4521 Mild intermittent asthma with (acute) exacerbation: Secondary | ICD-10-CM

## 2015-07-06 MED ORDER — AZITHROMYCIN 250 MG PO TABS: ORAL_TABLET | ORAL | Status: DC

## 2015-07-06 NOTE — Progress Notes (Signed)
   Subjective:    Patient ID: Lisa Burns, female    DOB: [birth year], 54 y.o.   MRN: [id number]  URI  This is a new problem. The current episode started in the past 7 days. The problem has been gradually worsening. There has been no fever. Associated symptoms include coughing, headaches and wheezing. She has tried inhaler use for the symptoms. The treatment provided no relief.   Patient states no other concerns this visit. Some wheeezing with it, no obv headache sig in face  Ibu took 800 mg     Review of Systems  Respiratory: Positive for cough and wheezing.   Neurological: Positive for headaches.       Objective:   Physical Exam Alert no acute distress moderate malaise. HEENT moderate his congestion frontal turns [place] neck supple. Lungs trace wheeze no tachypnea no crackles heart regular in rhythm       Assessment & Plan:  Impression rhinosinusitis/bronchitis with slight flare of reactive airways plan inhaler when necessary. Antibiotics prescribed. Symptom care discussed WSL

## 2015-07-15 ENCOUNTER — Ambulatory Visit: Payer: BLUE CROSS/BLUE SHIELD | Admitting: Family Medicine

## 2015-07-19 ENCOUNTER — Encounter: Payer: Self-pay | Admitting: Family Medicine

## 2015-07-19 ENCOUNTER — Ambulatory Visit (INDEPENDENT_AMBULATORY_CARE_PROVIDER_SITE_OTHER): Payer: BLUE CROSS/BLUE SHIELD | Admitting: Family Medicine

## 2015-07-19 VITALS — BP 106/70 | Ht 60.0 in | Wt 213.0 lb

## 2015-07-19 DIAGNOSIS — I10 Essential (primary) hypertension: Secondary | ICD-10-CM | POA: Diagnosis not present

## 2015-07-19 DIAGNOSIS — E785 Hyperlipidemia, unspecified: Secondary | ICD-10-CM

## 2015-07-19 DIAGNOSIS — G609 Hereditary and idiopathic neuropathy, unspecified: Secondary | ICD-10-CM

## 2015-07-19 DIAGNOSIS — J4531 Mild persistent asthma with (acute) exacerbation: Secondary | ICD-10-CM

## 2015-07-19 MED ORDER — CEFDINIR 300 MG PO CAPS: 300.0000 mg | ORAL_CAPSULE | Freq: Two times a day (BID) | ORAL | Status: DC

## 2015-07-19 MED ORDER — FLUCONAZOLE 150 MG PO TABS: ORAL_TABLET | ORAL | Status: DC

## 2015-07-19 MED ORDER — FUROSEMIDE 40 MG PO TABS: 40.0000 mg | ORAL_TABLET | Freq: Every morning | ORAL | Status: DC

## 2015-07-19 MED ORDER — ENALAPRIL MALEATE 10 MG PO TABS: ORAL_TABLET | ORAL | Status: DC

## 2015-07-19 MED ORDER — NORTRIPTYLINE HCL 50 MG PO CAPS: ORAL_CAPSULE | ORAL | Status: DC

## 2015-07-19 NOTE — Progress Notes (Signed)
   Subjective:    Patient ID: Lisa Burns, female    DOB: 03/15/1961, 54 y.o.   MRN: [id number]  Hyperlipidemia This is a chronic problem. The current episode started more than 1 year ago.   Sees Dr. Dorris Fetch for diabetes.  Pt has no problems or concerns today.   Walking a fair amnt, now using atomic [employer] covers things well. This is helped her asthma considerably. Rarely uses her rescue inhaler.  Compliant with the nortriptyline. It is helping her neuro pathic pain.  Compliant with blood pressure medicine. Watching salt intake. Generally does not miss a dose.   Recently treated for sinus bronchitis. Feels she still has some infection going on.    Review of Systems    no fever no chills no chest pain no shortness breath no abdominal pain Objective:   Physical Exam  Alert vitals stable blood pressure good on repeat H&T moderate his congestion frank normal neck supple lungs clear heart regular in rhythm ankles without edema      Assessment & Plan:  Impression 1 hypertension good control discussed #2 hyperlipidemia compliant with medication #3 rhinosinusitis discussed #4 chronic neuropathy ongoing plan maintain same medications. Diet exercise discussed. Chronic medications refilled. Recheck in 6 months. WSL

## 2015-08-23 ENCOUNTER — Other Ambulatory Visit: Payer: Self-pay | Admitting: "Endocrinology

## 2015-09-06 ENCOUNTER — Other Ambulatory Visit: Payer: Self-pay | Admitting: "Endocrinology

## 2015-09-06 ENCOUNTER — Other Ambulatory Visit: Payer: Self-pay | Admitting: Family Medicine

## 2015-09-20 ENCOUNTER — Other Ambulatory Visit: Payer: Self-pay | Admitting: "Endocrinology

## 2015-10-06 ENCOUNTER — Ambulatory Visit: Payer: BLUE CROSS/BLUE SHIELD | Admitting: "Endocrinology

## 2015-10-12 LAB — HEMOGLOBIN A1C: Hgb A1c MFr Bld: 7.3 % — AB (ref 4.0–6.0)

## 2015-10-20 ENCOUNTER — Ambulatory Visit: Payer: BLUE CROSS/BLUE SHIELD | Admitting: "Endocrinology

## 2015-11-04 ENCOUNTER — Ambulatory Visit (INDEPENDENT_AMBULATORY_CARE_PROVIDER_SITE_OTHER): Payer: BLUE CROSS/BLUE SHIELD | Admitting: "Endocrinology

## 2015-11-04 ENCOUNTER — Encounter: Payer: Self-pay | Admitting: "Endocrinology

## 2015-11-04 VITALS — BP 127/82 | HR 70 | Ht 60.0 in | Wt 215.0 lb

## 2015-11-04 DIAGNOSIS — Z794 Long term (current) use of insulin: Secondary | ICD-10-CM

## 2015-11-04 DIAGNOSIS — I10 Essential (primary) hypertension: Secondary | ICD-10-CM

## 2015-11-04 DIAGNOSIS — E785 Hyperlipidemia, unspecified: Secondary | ICD-10-CM | POA: Diagnosis not present

## 2015-11-04 DIAGNOSIS — IMO0002 Reserved for concepts with insufficient information to code with codable children: Secondary | ICD-10-CM

## 2015-11-04 DIAGNOSIS — E118 Type 2 diabetes mellitus with unspecified complications: Secondary | ICD-10-CM | POA: Diagnosis not present

## 2015-11-04 DIAGNOSIS — E1165 Type 2 diabetes mellitus with hyperglycemia: Secondary | ICD-10-CM | POA: Diagnosis not present

## 2015-11-04 NOTE — Patient Instructions (Signed)

## 2015-11-04 NOTE — Progress Notes (Signed)
Subjective:    Patient ID: Lisa Burns, female    DOB: Jan 04, 1961, PCP Mickie Hillier, MD   Past Medical History  Diagnosis Date  . Hypertension   . Asthma   . Allergy   . GERD (gastroesophageal reflux disease)   . Diabetes mellitus without complication ([place])   . Diabetes mellitus with neuropathy ([place])   . Microproteinuria   . Venous stasis   . Reflux    Past Surgical History  Procedure Laterality Date  . Other surgical history      gerd surgery   Social History   Social History  . Marital Status: Divorced    Spouse Name: N/A  . Number of Children: N/A  . Years of Education: N/A   Social History Main Topics  . Smoking status: Former Research scientist (life sciences)  . Smokeless tobacco: None  . Alcohol Use: No  . Drug Use: No  . Sexual Activity: Not Asked   Other Topics Concern  . None   Social History Narrative   Outpatient Encounter Prescriptions as of 11/04/2015  Medication Sig  . atorvastatin (LIPITOR) 20 MG tablet Take 20 mg by mouth daily.  . enalapril (VASOTEC) 10 MG tablet TAKE ONE (1) TABLET AT BEDTIME  . estradiol (ESTRACE) 0.5 MG tablet Take 0.5 mg by mouth daily.  . fluticasone (FLONASE) 50 MCG/ACT nasal spray Place 1 spray into both nostrils daily.  . furosemide (LASIX) 40 MG tablet Take 1 tablet (40 mg total) by mouth every morning.  . gabapentin (NEURONTIN) 400 MG capsule TAKE ONE (1) CAPSULE THREE (3) TIMES EACH DAY  . gemfibrozil (LOPID) 600 MG tablet Take 600 mg by mouth 2 (two) times daily before a meal.  . JARDIANCE 25 MG TABS tablet TAKE ONE TABLET BY MOUTH EVERY MORNING  . nortriptyline (PAMELOR) 50 MG capsule Take 2 capsules po qhs  . PULMICORT FLEXHALER 180 MCG/ACT inhaler USE 3 TO 4 PUFFS EVERY MORNING. RINSE MOUTH AFTER EACH USE.  . TRULICITY 1.5 0000000 SOPN INJECT 1.5MG  ONCE WEEKLY  . cefdinir (OMNICEF) 300 MG capsule Take 1 capsule (300 mg total) by mouth 2 (two) times daily.  . fluconazole (DIFLUCAN) 150 MG tablet Take one tablet 3 days apart  .  glucose blood test strip Use as instructed  . lansoprazole (PREVACID) 15 MG capsule Take 1 capsule (15 mg total) by mouth daily.  Marland Kitchen LANTUS SOLOSTAR 100 UNIT/ML Solostar Pen INJECT UP TO 50 UNITS SUBCUTANEOULSY AT BEDTIME (Patient taking differently: INJECT 40 UNITS SUBCUTANEOULSY AT BEDTIME)  . loratadine (CLARITIN) 10 MG tablet Take 10 mg by mouth. Take two everyday  . valACYclovir (VALTREX) 500 MG tablet   . VENTOLIN HFA 108 (90 BASE) MCG/ACT inhaler USE TWO SPRAYS EVERY FOUR HOURS AS NEEDED FOR WHEEZING  . [DISCONTINUED] TRULICITY A999333 0000000 SOPN    No facility-administered encounter medications on file as of 11/04/2015.   ALLERGIES: Allergies  Allergen Reactions  . Augmentin [Amoxicillin-Pot Clavulanate]     GI upset  . Januvia [Sitagliptin]   . Metformin And Related     GI upset   VACCINATION STATUS: Immunization History  Administered Date(s) Administered  . Pneumococcal Polysaccharide-23 12/30/2013    Diabetes She presents for her follow-up diabetic visit. She has type 2 diabetes mellitus. Onset time: She was diagnosed at approximate age of 61 years. Her disease course has been stable. There are no hypoglycemic associated symptoms. Pertinent negatives for hypoglycemia include no confusion, headaches, pallor or seizures. There are no diabetic associated symptoms. Pertinent negatives for  diabetes include no chest pain, no polydipsia, no polyphagia and no polyuria. There are no hypoglycemic complications. Symptoms are improving. There are no diabetic complications. Risk factors for coronary artery disease include diabetes mellitus, dyslipidemia, family history, hypertension, obesity, sedentary lifestyle and tobacco exposure. Current diabetic treatment includes insulin injections and oral agent (monotherapy). She is compliant with treatment most of the time. Her weight is stable. She is following a generally unhealthy diet. When asked about meal planning, she reported none. She has not  had a previous visit with a dietitian. She never participates in exercise. There is no change in her home blood glucose trend. Her breakfast blood glucose range is generally 140-180 mg/dl. An ACE inhibitor/angiotensin II receptor blocker is being taken. Eye exam is current.  Hyperlipidemia This is a chronic problem. The current episode started more than 1 year ago. Pertinent negatives include no chest pain, myalgias or shortness of breath. Current antihyperlipidemic treatment includes statins. Risk factors for coronary artery disease include diabetes mellitus, dyslipidemia, hypertension, obesity and a sedentary lifestyle.  Hypertension This is a chronic problem. The current episode started more than 1 year ago. The problem is controlled. Pertinent negatives include no chest pain, headaches, palpitations or shortness of breath. Past treatments include ACE inhibitors.     Review of Systems  Constitutional: Negative for unexpected weight change.  HENT: Negative for trouble swallowing and voice change.   Eyes: Negative for visual disturbance.  Respiratory: Negative for cough, shortness of breath and wheezing.   Cardiovascular: Negative for chest pain, palpitations and leg swelling.  Gastrointestinal: Negative for nausea, vomiting and diarrhea.  Endocrine: Negative for cold intolerance, heat intolerance, polydipsia, polyphagia and polyuria.  Musculoskeletal: Negative for myalgias and arthralgias.  Skin: Negative for color change, pallor, rash and wound.  Neurological: Negative for seizures and headaches.  Psychiatric/Behavioral: Negative for suicidal ideas and confusion.    Objective:    BP 127/82 mmHg  Pulse 70  Ht 5' (1.524 m)  Wt 215 lb (97.523 kg)  BMI 41.99 kg/m2  SpO2 98%  Wt Readings from Last 3 Encounters:  11/04/15 215 lb (97.523 kg)  07/19/15 213 lb (96.616 kg)  07/06/15 213 lb 4 oz (96.73 kg)    Physical Exam  Constitutional: She is oriented to person, place, and time. She  appears well-developed.  HENT:  Head: Normocephalic and atraumatic.  Eyes: EOM are normal.  Neck: Normal range of motion. Neck supple. No tracheal deviation present. No thyromegaly present.  Cardiovascular: Normal rate and regular rhythm.   Pulmonary/Chest: Effort normal and breath sounds normal.  Abdominal: Soft. Bowel sounds are normal. There is no tenderness. There is no guarding.  Musculoskeletal: Normal range of motion. She exhibits no edema.  Neurological: She is alert and oriented to person, place, and time. She has normal reflexes. No cranial nerve deficit. Coordination normal.  Skin: Skin is warm and dry. No rash noted. No erythema. No pallor.  Psychiatric: She has a normal mood and affect. Judgment normal.    Results for orders placed or performed in visit on 11/04/15  Hemoglobin A1c  Result Value Ref Range   Hgb A1c MFr Bld 7.3 (A) 4.0 - 6.0 %   Complete Blood Count (Most recent): Lab Results  Component Value Date   WBC 5.4 05/06/2009   HGB 11.4* 05/06/2009   HCT 34.1* 05/06/2009   MCV 82.8 05/06/2009   PLT 244 05/06/2009   Chemistry (most recent): Lab Results  Component Value Date   NA 139 08/19/2013   K  3.3* 08/19/2013   CL 101 08/19/2013   CO2 26 08/19/2013   BUN 15 08/19/2013   CREATININE 0.52 08/19/2013   Diabetic Labs (most recent): Lab Results  Component Value Date   HGBA1C 7.3* 10/12/2015   HGBA1C 9.6 08/27/2013   HGBA1C 10.4 04/29/2013   Lipid profile (most recent): Lab Results  Component Value Date   TRIG 575* 08/19/2013   CHOL 220* 08/19/2013     Assessment & Plan:   1. Uncontrolled type 2 diabetes mellitus with complication, with long-term current use of insulin ([place]) -  patient remains at a high risk for more acute and chronic complications of diabetes which include CAD, CVA, CKD, retinopathy, and neuropathy. These are all discussed in detail with the patient.  Patient came with controlled fasting glucose profile, and  recent A1c of  7.3 %.  Glucose logs and insulin administration records pertaining to this visit,  to be scanned into patient's records.  Recent labs reviewed.   - I have re-counseled the patient on diet management and weight loss  by adopting a carbohydrate restricted / protein rich  Diet.  - Suggestion is made for patient to avoid simple carbohydrates   from their diet including Cakes , Desserts, Ice Cream,  Soda (  diet and regular) , Sweet Tea , Candies,  Chips, Cookies, Artificial Sweeteners,   and "Sugar-free" Products .  This will help patient to have stable blood glucose profile and potentially avoid unintended  Weight gain.  - Patient is advised to stick to a routine mealtimes to eat 3 meals  a day and avoid unnecessary snacks ( to snack only to correct hypoglycemia).  - I have approached patient with the following individualized plan to manage diabetes and patient agrees.  -She will  continue Lantus 40 units qhs, she will not need prandial insulin for now. - I will continue Jardiance to 25 mg po qam. -Continue  Trulicity 1.5mg  weekly.   - Patient specific target  for A1c; LDL, HDL, Triglycerides, and  Waist Circumference were discussed in detail.  2) BP/HTN: controlled. Continue current medications including ACEI/ARB. 3) Lipids/HPL:  continue statins, and gemfibrozil.  Insurance did not cover Lovaza.  4)  Weight/Diet: exercise, and carbohydrates information provided.  5) Chronic Care/Health Maintenance:  -Patient is on ACEI/ARB and Statin medications and encouraged to continue to follow up with Ophthalmology, Podiatrist at least yearly or according to recommendations, and advised to  stay away from smoking. I have recommended yearly flu vaccine and pneumonia vaccination at least every 5 years; moderate intensity exercise for up to 150 minutes weekly; and  sleep for at least 7 hours a day.  - 25 minutes of time was spent on the care of this patient , 50% of which was applied for counseling on  diabetes complications and their preventions.  - I advised patient to maintain close follow up with Mickie Hillier, MD for primary care needs.  Patient is asked to bring meter and  blood glucose logs during their next visit.   Follow up plan: -Return in about 3 months (around 02/02/2016) for diabetes, high blood pressure, high cholesterol, follow up with pre-visit labs, meter, and logs.  Glade Lloyd, MD Phone: [phone]  Fax: [phone]   11/04/2015, 9:57 PM

## 2015-11-11 ENCOUNTER — Other Ambulatory Visit: Payer: Self-pay | Admitting: "Endocrinology

## 2015-12-02 ENCOUNTER — Other Ambulatory Visit: Payer: Self-pay | Admitting: Family Medicine

## 2015-12-23 ENCOUNTER — Other Ambulatory Visit: Payer: Self-pay | Admitting: "Endocrinology

## 2015-12-28 ENCOUNTER — Ambulatory Visit (INDEPENDENT_AMBULATORY_CARE_PROVIDER_SITE_OTHER): Payer: BLUE CROSS/BLUE SHIELD | Admitting: Family Medicine

## 2015-12-28 ENCOUNTER — Encounter: Payer: Self-pay | Admitting: Family Medicine

## 2015-12-28 VITALS — BP 110/74 | Temp 99.5°F | Ht 60.0 in | Wt 210.1 lb

## 2015-12-28 DIAGNOSIS — R51 Headache: Secondary | ICD-10-CM

## 2015-12-28 DIAGNOSIS — R519 Headache, unspecified: Secondary | ICD-10-CM

## 2015-12-28 MED ORDER — HYDROCODONE-ACETAMINOPHEN 5-325 MG PO TABS: 1.0000 | ORAL_TABLET | Freq: Four times a day (QID) | ORAL | Status: DC | PRN

## 2015-12-28 NOTE — Progress Notes (Signed)
   Subjective:    Patient ID: ERNEST SAK, female    DOB: 1961-02-08, 55 y.o.   MRN: [id number]  Otalgia  There is pain in the right ear. This is a new problem. The current episode started in the past 7 days. The problem has been unchanged. There has been no fever. The pain is moderate. Associated symptoms comments: Swollen gland, right sided facial swelling and tenderness. She has tried NSAIDs for the symptoms. The treatment provided mild relief.   Patient has no other concerns at this time.    Review of Systems  HENT: Positive for ear pain.   she denies congestion drainage coughing wheezing difficulty breathing. She denies nausea vomiting diarrhea. She does state right-sided headache. Denies rash     Objective:   Physical Exam  I do not find any evidence of shingles. The throat is normal. Eardrums are normal. There is a little bit of lymphadenopathy in the posterior occiput on the right side. There is some tenderness in the temporal artery region and temporal region of the head. Patient does not appear toxic.      Assessment & Plan:  It is hard to know for certain the cause of this at this present moment temporal arteritis is a possibility we will check lab work I also have educated the patient what to watch for regarding shingles. Currently I would recommend pain medication as necessary the patient is to give [place] follow-up over the course of the next few days and if not doing better by next week we will need further testing Lab work ordered. Patient to watch for any signs of shingles.she is to let us know if any rashes

## 2015-12-29 ENCOUNTER — Telehealth: Payer: Self-pay | Admitting: Family Medicine

## 2015-12-29 LAB — C-REACTIVE PROTEIN: CRP: 54.8 mg/L — AB (ref 0.0–4.9)

## 2015-12-29 LAB — SEDIMENTATION RATE: SED RATE: 10 mm/h (ref 0–40)

## 2015-12-29 NOTE — Telephone Encounter (Signed)
Call pt , sed rate perfect and low, creactive protein quite high, this is unusual, bothe genrally either rise considerably or both stay the same rec re ck tomorrow morn in [facility] e and disc what to do next

## 2015-12-29 NOTE — Telephone Encounter (Signed)
[facility] 12/29/15 

## 2015-12-29 NOTE — Telephone Encounter (Signed)
Pt is wanting to know the results to her recent labs.  

## 2015-12-30 ENCOUNTER — Ambulatory Visit: Payer: BLUE CROSS/BLUE SHIELD | Admitting: Family Medicine

## 2015-12-30 ENCOUNTER — Other Ambulatory Visit: Payer: Self-pay | Admitting: "Endocrinology

## 2015-12-30 ENCOUNTER — Ambulatory Visit (INDEPENDENT_AMBULATORY_CARE_PROVIDER_SITE_OTHER): Payer: BLUE CROSS/BLUE SHIELD | Admitting: Family Medicine

## 2015-12-30 ENCOUNTER — Encounter: Payer: Self-pay | Admitting: Family Medicine

## 2015-12-30 VITALS — BP 130/76 | Ht 60.0 in | Wt 207.8 lb

## 2015-12-30 DIAGNOSIS — R519 Headache, unspecified: Secondary | ICD-10-CM

## 2015-12-30 DIAGNOSIS — R51 Headache: Secondary | ICD-10-CM | POA: Diagnosis not present

## 2015-12-30 NOTE — Telephone Encounter (Signed)
Spoke with patient and informed her per Dr.Steve Luking- Sedimentation  rate perfect and low, c-reactive protein quite high, this is unusual, both genrally either rise considerably or both stay the same recommend  recheck  tomorrow morning in office and discuss what to do next. Patient verbalized understanding and was transferred to front desk to schedule appointment for today.

## 2015-12-30 NOTE — Progress Notes (Signed)
   Subjective:    Patient ID: Lisa Burns, female    DOB: 07-15-1961, 55 y.o.   MRN: [id number] See prior note patient returns office for further discussion of labs along with further discussion of headache. HPI Moderately signifiant and achey headache, temporal and nuch primarily on right  Pt now stateds headache better and nearly gone. It started in the right temple area. Radiated to the nuchal area. Now is posterior lateral on both sides with a mild achiness. No temple pain no fever no chills no paresthesias. Expect  No focal neurological deficits  Patient mentions a sense of nodules in the posterior cervical region.  No visual deficits.  Day [phone] Review of Systems No vomiting no nausea no chest pain no abdominal pain no shortness of breath ROS otherwise negative    Objective:   Physical Exam  Alert vitals stable. HEENT no tenderness along distribution temple artery drinks no neck supple lungs clear heart rare rhythm Of note no pathological nodules palpated posterior cervical region     Assessment & Plan:  Impression atypical headache which started with some symptoms potentially concerning for temporal arteritis. Seen by Dr. Nicki Reaper earlier in week. Sedimentation rate perfect at 4 but C-reactive protein substantially elevated this is nonspecific and puzzling, along with atypical nature now headache. Plan time for someone with more expertise than I to assess patient. Rationale discussed at length with patient. We'll work on neurology workup. 25 minutes spent most in discussion WSL

## 2015-12-31 ENCOUNTER — Encounter: Payer: Self-pay | Admitting: Family Medicine

## 2016-01-03 ENCOUNTER — Encounter: Payer: Self-pay | Admitting: Neurology

## 2016-01-03 ENCOUNTER — Ambulatory Visit (INDEPENDENT_AMBULATORY_CARE_PROVIDER_SITE_OTHER): Payer: BLUE CROSS/BLUE SHIELD | Admitting: Neurology

## 2016-01-03 VITALS — BP 138/86 | HR 80 | Resp 18 | Ht 60.0 in | Wt 214.0 lb

## 2016-01-03 DIAGNOSIS — R7982 Elevated C-reactive protein (CRP): Secondary | ICD-10-CM | POA: Diagnosis not present

## 2016-01-03 DIAGNOSIS — R51 Headache: Secondary | ICD-10-CM | POA: Diagnosis not present

## 2016-01-03 DIAGNOSIS — R519 Headache, unspecified: Secondary | ICD-10-CM

## 2016-01-03 NOTE — Patient Instructions (Addendum)
You may have a condition called temporal arteritis, and I would like to do more workup including repeating your ESR and CRP but also adding additional rheumatological markers in your blood. We will do blood testing today and call you with the test results. I would like for you to see an ophthalmologist and we will also make a referral to a rheumatologist after your blood work is back. It is important that you get your physician tested fully with a ophthalmologist. The most sinister complication for temporal arteritis is permanent vision loss.  I would also proceed with imaging testing of the brain including a brain MRI with and without contrast and brain MRA which is a blood vessel test of your brain. We will call you with the test results. Sometimes we request a biopsy of the temporal artery, which is a very specific test but not a very sensitive test. Since it is invasive, we will await other test results first.   For sudden symptom flare up, you have to go to the ER, such as severe headache, blurry vision, severe neck or joint pain.

## 2016-01-03 NOTE — Progress Notes (Signed)
Subjective:    Patient ID: Lisa Burns is a 55 y.o. female.  HPI     Star Age, MD, PhD [facility] [address], [place] [address]  Dear Dr. Wolfgang Phoenix,   I saw your patient, Lisa Burns, upon your kind request in my neurologic clinic today for initial consultation of her headaches. The patient is unaccompanied today. As you know, [name] is a very pleasant 55 year old right-handed woman with an underlying medical history of hypertension, allergies, asthma, reflux disease, diabetes, neuropathy, and morbid obesity, who reports a one-week history of temporal headache on the right. Symptoms started about 1 week ago with new onset headache on the right side which seemed to refer him to the right ear. She had no problems chewing or swallowing or hearing and no vertigo. She felt that she had swelling in the back of her head, at the base of the skull. Her headache seemed to go to the left side after that. She had maybe some blurry vision but is not 100% sure. She had some neck discomfort on the left side. She felt lumps in the back of her head, at the base of her skull but these have resolved. She took some ibuprofen at home. She was given a prescription for hydrocodone which she has taken sparingly. Her headache symptoms have resolved. She feels almost back to baseline. She has perhaps a little tenderness in her right temple. She has no family history of rheumatological conditions. She denies problems with her sleep. She has a history of snoring but denies any apneic pauses while asleep, choking sensation, gasping sensations, or morning headaches. She does have nocturia.  I rviewed your office note from 12/30/2015. Prior to that she was seen  on 12/28/2015 with right-sided temporal headache of one week duration. Blood work showed normal ESR 10, CRP highly elevated at  54.8 however.   Her Past Medical History Is Significant For: Past Medical History   Diagnosis Date  . Hypertension   . Asthma   . Allergy   . GERD (gastroesophageal reflux disease)   . Diabetes mellitus without complication ([place])   . Diabetes mellitus with neuropathy ([place])   . Microproteinuria   . Venous stasis   . Reflux     Her Past Surgical History Is Significant For: Past Surgical History  Procedure Laterality Date  . Other surgical history      gerd surgery  . Cesarean section    . Throat surgery  2000    Her Family History Is Significant For: Family History  Problem Relation Age of Onset  . Diabetes Mother   . Heart disease Father   . Cancer Father     lung  . Heart disease Sister   . Diabetes Sister   . Heart disease Brother   . Diabetes Brother     Her Social History Is Significant For: Social History   Social History  . Marital Status: Married    Spouse Name: N/A  . Number of Children: 1  . Years of Education: HS   Occupational History  . [employer]     Social History Main Topics  . Smoking status: Former Research scientist (life sciences)  . Smokeless tobacco: None  . Alcohol Use: 0.0 oz/week    0 Standard drinks or equivalent per week  . Drug Use: No  . Sexual Activity: Not Asked   Other Topics Concern  . None   Social History Narrative   Drinks 2  cans of soda a day     Her Allergies Are:  Allergies  Allergen Reactions  . Augmentin [Amoxicillin-Pot Clavulanate]     GI upset  . Januvia [Sitagliptin]   . Metformin And Related     GI upset  :   Her Current Medications Are:  Outpatient Encounter Prescriptions as of 01/03/2016  Medication Sig  . atorvastatin (LIPITOR) 20 MG tablet Take 20 mg by mouth daily.  . enalapril (VASOTEC) 10 MG tablet TAKE ONE (1) TABLET AT BEDTIME  . estradiol (ESTRACE) 0.5 MG tablet Take 0.5 mg by mouth daily.  . fluticasone (FLONASE) 50 MCG/ACT nasal spray Place 1 spray into both nostrils daily.  . furosemide (LASIX) 40 MG tablet Take 1 tablet (40 mg total) by mouth every morning.  . gabapentin (NEURONTIN) 400  MG capsule TAKE ONE (1) CAPSULE THREE (3) TIMES EACH DAY  . gemfibrozil (LOPID) 600 MG tablet Take 600 mg by mouth 2 (two) times daily before a meal.  . glucose blood test strip Use as instructed  . HYDROcodone-acetaminophen (NORCO/VICODIN) 5-325 MG tablet Take 1 tablet by mouth every 6 (six) hours as needed.  Marland Kitchen JARDIANCE 25 MG TABS tablet TAKE ONE TABLET BY MOUTH EVERY MORNING  . lansoprazole (PREVACID) 15 MG capsule Take 1 capsule (15 mg total) by mouth daily.  Marland Kitchen LANTUS SOLOSTAR 100 UNIT/ML Solostar Pen INJECT UP TO 50 UNITS SUBCUTANEOUSLY AT BEDTIME  . loratadine (CLARITIN) 10 MG tablet Take 10 mg by mouth. Take two everyday  . nortriptyline (PAMELOR) 50 MG capsule Take 2 capsules po qhs  . PULMICORT FLEXHALER 180 MCG/ACT inhaler USE 3 TO 4 PUFFS EVERY MORNING . RINSE MOUTH AFTER EACH USE  . TRULICITY 1.5 BJ/4.7WG SOPN INJECT 1.5MG ONCE WEEKLY  . valACYclovir (VALTREX) 500 MG tablet   . VENTOLIN HFA 108 (90 Base) MCG/ACT inhaler USE TWO SPRAYS EVERY FOUR HOURS AS NEEDED FOR WHEEZING   No facility-administered encounter medications on file as of 01/03/2016.  :   Review of Systems:  Out of a complete 14 point review of systems, all are reviewed and negative with the exception of these symptoms as listed below:   Review of Systems  Constitutional: Positive for fatigue.  Neurological: Positive for headaches.       Patient reports pain that started on R side of head then moved to L side of head a couple of days later. States that she has 2 "lumps" on the back of her head. Her scalp now has an itching sensation.     Objective:  Neurologic Exam  Physical Exam Physical Examination:   Filed Vitals:   01/03/16 1401  BP: 138/86  Pulse: 80  Resp: 18    General Examination: The patient is a very pleasant 55 y.o. female in no acute distress. She appears well-developed and well-nourished and well groomed.   HEENT: Normocephalic, atraumatic, pupils are equal, round and reactive to light  and accommodation. Funduscopic exam is normal with sharp disc margins noted. She has a right sided contact lens in place. She is slightly tender in the right temporal area and I feel her right temporal artery slightly as a palpable cord. She has no nuchal tenderness. She has no lymphadenopathy. She has no lumps at the base of her skull.  Extraocular tracking is good without limitation to gaze excursion or nystagmus noted. Normal smooth pursuit is noted. Hearing is grossly intact. Tympanic membranes are clear bilaterally. Face is symmetric with normal facial animation and normal facial sensation. Speech is  clear with no dysarthria noted. There is no hypophonia. There is no lip, neck/head, jaw or voice tremor. Neck is supple with full range of passive and active motion. There are no carotid bruits on auscultation. Oropharynx exam reveals: moderate mouth dryness, adequate dental hygiene and mild airway crowding, due to narrow airway entry and redundant soft palate. Mallampati is class II. Tongue protrudes centrally and palate elevates symmetrically.  Chest: Clear to auscultation without wheezing, rhonchi or crackles noted.  Heart: S1+S2+0, regular and normal without murmurs, rubs or gallops noted.   Abdomen: Soft, non-tender and non-distended with normal bowel sounds appreciated on auscultation.  Extremities: There is trace pitting edema in the distal lower extremities bilaterally. Pedal pulses are intact.  Skin: Warm and dry without trophic changes noted. There are no varicose veins.  Musculoskeletal: exam reveals no obvious joint deformities, tenderness or joint swelling or erythema.   Neurologically:  Mental status: The patient is awake, alert and oriented in all 4 spheres. Her immediate and remote memory, attention, language skills and fund of knowledge are appropriate. There is no evidence of aphasia, agnosia, apraxia or anomia. Speech is clear with normal prosody and enunciation. Thought process  is linear. Mood is normal and affect is normal.  Cranial nerves II - XII are as described above under HEENT exam. In addition: shoulder shrug is normal with equal shoulder height noted. Motor exam: Normal bulk, strength and tone is noted. There is no drift, tremor or rebound. Romberg is negative. Reflexes are 2+ throughout. Babinski: Toes are flexor bilaterally. Fine motor skills and coordination: intact with normal finger taps, normal hand movements, normal rapid alternating patting, normal foot taps and normal foot agility.  Cerebellar testing: No dysmetria or intention tremor on finger to nose testing. Heel to shin is unremarkable bilaterally. There is no truncal or gait ataxia.  Sensory exam: intact to light touch, pinprick, vibration, temperature sense in the upper and lower extremities, with the exception of mild decrease in pinprick sensation in the distal lower extremities up to mid shin area.  Gait, station and balance: She stands easily. No veering to one side is noted. No leaning to one side is noted. Posture is age-appropriate and stance is narrow based. Gait shows normal stride length and normal pace. No problems turning are noted. She turns en bloc. Tandem walk is slightly difficult for her.  Assessment and Plan:   In summary, AGUSTINA WITZKE is a very pleasant 55 y.o.-year old female with an underlying medical history of hypertension, allergies, asthma, reflux disease, diabetes, neuropathy, and morbid obesity, who reports a one-week history of temporal headache on the right. She had an abnormally high CRP recently. She had a normal ESR. 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 She may have a slightly palpable cord on the right side and slight temporal tenderness on the right side of her head. I do think we need to do repeat blood testing and proceed with full ophthalmological consultation. I talked to her about this condition quite a bit and explained that this is a systemic  disease that can affect the head, and particularly vision, joint areas, but also internal organs at times. We will do CBC with differential, as sometimes platelets may be elevated, we will do ESR, CRP, consider temporal artery biopsy, and proceed with rheumatological referral if necessary. She may need to be treated with high-dose steroids but we will await initial test results. I would also like to proceed with a brain MRI with and without contrast and  brain MRA. We will call her with all her test results as we get them back, most likely by tomorrow as far as blood work is concerned. I have placed a referral to ophthalmology and we will consider rheumatological referral as well as referral to neurosurgery for temporal artery biopsy on the right side if needed. I will see her back soon and we will keep her posted over the phone as well.   Thank you very much for allowing me to participate in the care of this nice patient. If I can be of any further assistance to you please do not hesitate to call me at [phone].  Sincerely,   Star Age, MD, PhD

## 2016-01-04 LAB — CBC WITH DIFFERENTIAL/PLATELET
Basophils Absolute: 0.1 x10E3/uL (ref 0.0–0.2)
Basos: 1 %
EOS (ABSOLUTE): 0.2 x10E3/uL (ref 0.0–0.4)
Eos: 3 %
Hematocrit: 41.8 % (ref 34.0–46.6)
Hemoglobin: 14 g/dL (ref 11.1–15.9)
Immature Grans (Abs): 0.1 x10E3/uL (ref 0.0–0.1)
Immature Granulocytes: 1 %
Lymphocytes Absolute: 1.6 x10E3/uL (ref 0.7–3.1)
Lymphs: 21 %
MCH: 28.7 pg (ref 26.6–33.0)
MCHC: 33.5 g/dL (ref 31.5–35.7)
MCV: 86 fL (ref 79–97)
Monocytes Absolute: 0.3 x10E3/uL (ref 0.1–0.9)
Monocytes: 4 %
Neutrophils Absolute: 5.6 x10E3/uL (ref 1.4–7.0)
Neutrophils: 70 %
Platelets: 322 x10E3/uL (ref 150–379)
RBC: 4.87 x10E6/uL (ref 3.77–5.28)
RDW: 13.8 % (ref 12.3–15.4)
WBC: 7.8 x10E3/uL (ref 3.4–10.8)

## 2016-01-04 LAB — COMPREHENSIVE METABOLIC PANEL WITH GFR
ALT: 28 IU/L (ref 0–32)
AST: 15 IU/L (ref 0–40)
Albumin/Globulin Ratio: 1.9 (ref 1.1–2.5)
Albumin: 4.6 g/dL (ref 3.5–5.5)
Alkaline Phosphatase: 136 IU/L — ABNORMAL HIGH (ref 39–117)
BUN/Creatinine Ratio: 23 (ref 9–23)
BUN: 15 mg/dL (ref 6–24)
Bilirubin Total: 0.3 mg/dL (ref 0.0–1.2)
CO2: 26 mmol/L (ref 18–29)
Calcium: 9.6 mg/dL (ref 8.7–10.2)
Chloride: 98 mmol/L (ref 96–106)
Creatinine, Ser: 0.64 mg/dL (ref 0.57–1.00)
GFR calc Af Amer: 115 mL/min/1.73 (ref >59)
GFR calc non Af Amer: 100 mL/min/1.73 (ref >59)
Globulin, Total: 2.4 g/dL (ref 1.5–4.5)
Glucose: 168 mg/dL — ABNORMAL HIGH (ref 65–99)
Potassium: 4.1 mmol/L (ref 3.5–5.2)
Sodium: 143 mmol/L (ref 134–144)
Total Protein: 7 g/dL (ref 6.0–8.5)

## 2016-01-04 LAB — SEDIMENTATION RATE: SED RATE: 20 mm/h (ref 0–40)

## 2016-01-04 LAB — RHEUMATOID FACTOR: Rheumatoid fact SerPl-aCnc: <10 [IU]/mL (ref 0.0–13.9)

## 2016-01-04 LAB — C-REACTIVE PROTEIN: CRP: 19.7 mg/L — AB (ref 0.0–4.9)

## 2016-01-04 LAB — ANA W/REFLEX: Anti Nuclear Antibody(ANA): NEGATIVE

## 2016-01-04 LAB — TSH: TSH: 0.601 u[IU]/mL (ref 0.450–4.500)

## 2016-01-04 NOTE — Progress Notes (Signed)
Quick Note:  Please call patient with her blood test results. Her ESR is still in the normal range and the C reactive protein has come down quite a bit, and a spontaneous drastic improvement in CRP would speak against temporal arteritis. Her sugar level was too high, indicating suboptimally controlled diabetes but her most recent A1c in November 2016 was 7.3, indicating improvement of glucose control overall compared to before when it was 9 to 10. She has to continue to work on her diabetes control, but in the larger scheme of things, this has improved from before. Thyroid screening test was unremarkable. Liver function in generally okay, one liver enzyme called alkaline phosphatase was mildly elevated, most likely secondary to taking medication long-term. CBC which is cell count was fine, in particular, platelets were normal, sometimes platelets are elevated in patients with temporal arteritis, therefore, another reassuring finding at this time. We will await other test results as planned. No immediate action is required on these blood test results, one inflammatory marker is still pending, we will call if abnormal.  Saima Athar, MD, PhD [facility] ([facility])  ______ 

## 2016-01-05 ENCOUNTER — Telehealth: Payer: Self-pay

## 2016-01-05 NOTE — Telephone Encounter (Signed)
I spoke to patient and she is aware of results and voiced understanding.

## 2016-01-05 NOTE — Telephone Encounter (Signed)
-----  Message from Star Age, MD sent at 01/04/2016 10:27 AM EST ----- Please call patient with her blood test results. Her ESR is still in the normal range and the C reactive protein has come down quite a bit, and a spontaneous drastic improvement in CRP would speak against temporal arteritis. Her sugar level was too high, indicating suboptimally controlled diabetes but her most recent A1c in November 2016 was 7.3, indicating improvement of glucose control overall compared to before when it was 9 to 10. She has to continue to work on her diabetes control, but in the larger scheme of things, this has improved from before. Thyroid screening test was unremarkable. Liver function in generally okay, one liver enzyme called alkaline phosphatase was mildly elevated, most likely secondary to taking medication long-term. CBC which is cell count was fine, in particular, platelets were normal, sometimes platelets are elevated in patients with temporal arteritis, therefore, another reassuring finding at this time. We will await other test results as planned. No immediate action is required on these blood test results, one inflammatory marker is still pending, we will call if abnormal.  Star Age, MD, PhD [facility] ([place])

## 2016-01-09 ENCOUNTER — Ambulatory Visit
Admission: RE | Admit: 2016-01-09 | Discharge: 2016-01-09 | Disposition: A | Discharge status: Home / Self Care | Payer: Self-pay | Source: Ambulatory Visit | Attending: Neurology | Admitting: Neurology

## 2016-01-09 ENCOUNTER — Ambulatory Visit
Admission: RE | Admit: 2016-01-09 | Discharge: 2016-01-09 | Disposition: A | Discharge status: Home / Self Care | Payer: BLUE CROSS/BLUE SHIELD | Source: Ambulatory Visit | Attending: Neurology | Admitting: Neurology

## 2016-01-09 DIAGNOSIS — R7982 Elevated C-reactive protein (CRP): Secondary | ICD-10-CM

## 2016-01-09 DIAGNOSIS — R519 Headache, unspecified: Secondary | ICD-10-CM

## 2016-01-09 DIAGNOSIS — R51 Headache: Principal | ICD-10-CM

## 2016-01-09 MED ORDER — GADOBENATE DIMEGLUMINE 529 MG/ML IV SOLN
20.0000 mL | Freq: Once | INTRAVENOUS | Status: AC | PRN
  Administered 2016-01-09: 20 mL via INTRAVENOUS

## 2016-01-10 ENCOUNTER — Other Ambulatory Visit: Payer: Self-pay | Admitting: Family Medicine

## 2016-01-10 ENCOUNTER — Telehealth: Payer: Self-pay

## 2016-01-10 NOTE — Progress Notes (Signed)
Quick Note:  Please call patient regarding the recent brain MRI: The brain scan showed a normal structure of the brain and no volume loss or what what we call atrophy. There were changes in the deeper structures of the brain, which we call white matter changes or microvascular changes. These were reported as minimal in Her case. These are tiny white spots, that occur with time and are seen in a variety of conditions, including with normal aging, chronic hypertension, chronic headaches, especially migraine HAs, chronic diabetes, chronic hyperlipidemia. These are not strokes and no mass or lesion or contrast enhancement was seen which is reassuring. Again, there were no acute findings, such as a stroke, or mass or blood products.   MRA head was norma.   No further action is required on these tests at this time, other than re-enforcing the importance of good blood pressure control, good cholesterol control, good blood sugar control, and weight management. Please remind patient to keep any upcoming appointments or tests and to call us with any interim questions, concerns, problems or updates. Thanks,  Star Age, MD, PhD    ______

## 2016-01-10 NOTE — Telephone Encounter (Signed)
-----   Message from Star Age, MD sent at 01/10/2016  7:44 AM EST ----- Please call patient regarding the recent brain MRI: The brain scan showed a normal structure of the brain and no volume loss or what what we call atrophy. There were changes in the deeper structures of the brain, which we call white matter changes or microvascular changes. These were reported as minimal in Her case. These are tiny white spots, that occur with time and are seen in a variety of conditions, including with normal aging, chronic hypertension, chronic headaches, especially migraine HAs, chronic diabetes, chronic hyperlipidemia. These are not strokes and no mass or lesion or contrast enhancement was seen which is reassuring. Again, there were no acute findings, such as a stroke, or mass or blood products.   MRA head was norma.   No further action is required on these tests at this time, other than re-enforcing the importance of good blood pressure control, good cholesterol control, good blood sugar control, and weight management. Please remind patient to keep any upcoming appointments or tests and to call us with any interim questions, concerns, problems or updates. Thanks,  Star Age, MD, PhD

## 2016-01-10 NOTE — Telephone Encounter (Signed)
I spoke to patient and she is aware of results and recommendations. She voiced understanding.  

## 2016-01-17 ENCOUNTER — Encounter: Payer: BLUE CROSS/BLUE SHIELD | Admitting: Family Medicine

## 2016-01-17 LAB — HM DIABETES EYE EXAM

## 2016-01-24 ENCOUNTER — Other Ambulatory Visit: Payer: Self-pay | Admitting: Family Medicine

## 2016-02-01 ENCOUNTER — Other Ambulatory Visit: Payer: Self-pay | Admitting: "Endocrinology

## 2016-02-01 LAB — LIPID PANEL
CHOLESTEROL: 175 mg/dL (ref 125–200)
HDL: 45 mg/dL — AB (ref >=46)
LDL CALC: 87 mg/dL (ref <130)
TRIGLYCERIDES: 214 mg/dL — AB (ref <150)
Total CHOL/HDL Ratio: 3.9 Ratio (ref <=5.0)
VLDL: 43 mg/dL — AB (ref <30)

## 2016-02-01 LAB — BASIC METABOLIC PANEL
BUN: 21 mg/dL (ref 7–25)
CHLORIDE: 102 mmol/L (ref 98–110)
CO2: 28 mmol/L (ref 20–31)
Calcium: 9.6 mg/dL (ref 8.6–10.4)
Creat: 0.64 mg/dL (ref 0.50–1.05)
Glucose, Bld: 175 mg/dL — ABNORMAL HIGH (ref 65–99)
POTASSIUM: 3.9 mmol/L (ref 3.5–5.3)
SODIUM: 139 mmol/L (ref 135–146)

## 2016-02-01 LAB — HEMOGLOBIN A1C
Hgb A1c MFr Bld: 7.6 % — ABNORMAL HIGH (ref <5.7)
MEAN PLASMA GLUCOSE: 171 mg/dL — AB (ref <117)

## 2016-02-03 ENCOUNTER — Encounter: Payer: Self-pay | Admitting: "Endocrinology

## 2016-02-03 ENCOUNTER — Ambulatory Visit (INDEPENDENT_AMBULATORY_CARE_PROVIDER_SITE_OTHER): Payer: BLUE CROSS/BLUE SHIELD | Admitting: "Endocrinology

## 2016-02-03 VITALS — BP 135/83 | HR 92 | Ht 60.0 in | Wt 208.0 lb

## 2016-02-03 DIAGNOSIS — E785 Hyperlipidemia, unspecified: Secondary | ICD-10-CM

## 2016-02-03 DIAGNOSIS — I10 Essential (primary) hypertension: Secondary | ICD-10-CM | POA: Diagnosis not present

## 2016-02-03 DIAGNOSIS — Z794 Long term (current) use of insulin: Secondary | ICD-10-CM | POA: Diagnosis not present

## 2016-02-03 DIAGNOSIS — E118 Type 2 diabetes mellitus with unspecified complications: Secondary | ICD-10-CM | POA: Diagnosis not present

## 2016-02-03 DIAGNOSIS — E1165 Type 2 diabetes mellitus with hyperglycemia: Secondary | ICD-10-CM | POA: Diagnosis not present

## 2016-02-03 DIAGNOSIS — IMO0002 Reserved for concepts with insufficient information to code with codable children: Secondary | ICD-10-CM

## 2016-02-03 MED ORDER — INSULIN GLARGINE 100 UNIT/ML SOLOSTAR PEN: 46.0000 [IU] | PEN_INJECTOR | Freq: Every day | SUBCUTANEOUS | Status: DC

## 2016-02-03 MED ORDER — DULAGLUTIDE 1.5 MG/0.5ML ~~LOC~~ SOAJ: 1.5000 mg | SUBCUTANEOUS | Status: DC

## 2016-02-03 MED ORDER — EMPAGLIFLOZIN 25 MG PO TABS: 25.0000 mg | ORAL_TABLET | Freq: Every morning | ORAL | Status: DC

## 2016-02-03 NOTE — Progress Notes (Signed)
Subjective:    Patient ID: Lisa Burns, female    DOB: 1961/02/07, PCP Mickie Hillier, MD   Past Medical History  Diagnosis Date  . Hypertension   . Asthma   . Allergy   . GERD (gastroesophageal reflux disease)   . Diabetes mellitus without complication ([place])   . Diabetes mellitus with neuropathy ([place])   . Microproteinuria   . Venous stasis   . Reflux    Past Surgical History  Procedure Laterality Date  . Other surgical history      gerd surgery  . Cesarean section    . Throat surgery  2000   Social History   Social History  . Marital Status: Married    Spouse Name: N/A  . Number of Children: 1  . Years of Education: HS   Occupational History  . [employer]     Social History Main Topics  . Smoking status: Former Research scientist (life sciences)  . Smokeless tobacco: None  . Alcohol Use: 0.0 oz/week    0 Standard drinks or equivalent per week  . Drug Use: No  . Sexual Activity: Not Asked   Other Topics Concern  . None   Social History Narrative   Drinks 2 cans of soda a day    Outpatient Encounter Prescriptions as of 02/03/2016  Medication Sig  . atorvastatin (LIPITOR) 20 MG tablet Take 20 mg by mouth daily.  . Dulaglutide (TRULICITY) 1.5 0000000 SOPN Inject 1.5 mg into the skin once a week.  . empagliflozin (JARDIANCE) 25 MG TABS tablet Take 25 mg by mouth every morning.  . enalapril (VASOTEC) 10 MG tablet TAKE ONE (1) TABLET AT BEDTIME  . estradiol (ESTRACE) 0.5 MG tablet Take 0.5 mg by mouth daily.  . fluticasone (FLONASE) 50 MCG/ACT nasal spray Place 1 spray into both nostrils daily.  . furosemide (LASIX) 40 MG tablet TAKE ONE TABLET BY MOUTH EVERY MORNING  . gabapentin (NEURONTIN) 400 MG capsule TAKE ONE (1) CAPSULE THREE (3) TIMES EACH DAY  . gemfibrozil (LOPID) 600 MG tablet Take 600 mg by mouth 2 (two) times daily before a meal.  . glucose blood test strip Use as instructed  . HYDROcodone-acetaminophen (NORCO/VICODIN) 5-325 MG tablet Take 1 tablet by mouth every 6  (six) hours as needed.  . Insulin Glargine (LANTUS SOLOSTAR) 100 UNIT/ML Solostar Pen Inject 46 Units into the skin daily at 10 pm.  . lansoprazole (PREVACID) 15 MG capsule Take 1 capsule (15 mg total) by mouth daily.  Marland Kitchen loratadine (CLARITIN) 10 MG tablet Take 10 mg by mouth. Take two everyday  . nortriptyline (PAMELOR) 50 MG capsule Take 2 capsules po qhs  . PULMICORT FLEXHALER 180 MCG/ACT inhaler USE 3 TO 4 PUFFS EVERY MORNING . RINSE MOUTH AFTER EACH USE  . valACYclovir (VALTREX) 500 MG tablet   . VENTOLIN HFA 108 (90 Base) MCG/ACT inhaler USE TWO SPRAYS EVERY FOUR HOURS AS NEEDED FOR WHEEZING  . [DISCONTINUED] JARDIANCE 25 MG TABS tablet TAKE ONE TABLET BY MOUTH EVERY MORNING  . [DISCONTINUED] LANTUS SOLOSTAR 100 UNIT/ML Solostar Pen INJECT UP TO 50 UNITS SUBCUTANEOUSLY AT BEDTIME  . [DISCONTINUED] TRULICITY 1.5 0000000 SOPN INJECT 1.5MG  ONCE WEEKLY   No facility-administered encounter medications on file as of 02/03/2016.   ALLERGIES: Allergies  Allergen Reactions  . Augmentin [Amoxicillin-Pot Clavulanate]     GI upset  . Januvia [Sitagliptin]   . Metformin And Related     GI upset   VACCINATION STATUS: Immunization History  Administered Date(s) Administered  .  Pneumococcal Polysaccharide-23 12/30/2013    Diabetes She presents for her follow-up diabetic visit. She has type 2 diabetes mellitus. Onset time: She was diagnosed at approximate age of 77 years. Her disease course has been stable. There are no hypoglycemic associated symptoms. Pertinent negatives for hypoglycemia include no confusion, headaches, pallor or seizures. There are no diabetic associated symptoms. Pertinent negatives for diabetes include no chest pain, no polydipsia, no polyphagia and no polyuria. There are no hypoglycemic complications. Symptoms are improving. There are no diabetic complications. Risk factors for coronary artery disease include diabetes mellitus, dyslipidemia, family history, hypertension,  obesity, sedentary lifestyle and tobacco exposure. Current diabetic treatment includes insulin injections and oral agent (monotherapy). She is compliant with treatment most of the time. Her weight is stable. She is following a generally unhealthy diet. When asked about meal planning, she reported none. She has not had a previous visit with a dietitian. She never participates in exercise. There is no change in her home blood glucose trend. Her breakfast blood glucose range is generally 140-180 mg/dl. An ACE inhibitor/angiotensin II receptor blocker is being taken. Eye exam is current.  Hyperlipidemia This is a chronic problem. The current episode started more than 1 year ago. Pertinent negatives include no chest pain, myalgias or shortness of breath. Current antihyperlipidemic treatment includes statins. Risk factors for coronary artery disease include diabetes mellitus, dyslipidemia, hypertension, obesity and a sedentary lifestyle.  Hypertension This is a chronic problem. The current episode started more than 1 year ago. The problem is controlled. Pertinent negatives include no chest pain, headaches, palpitations or shortness of breath. Past treatments include ACE inhibitors.     Review of Systems  Constitutional: Negative for unexpected weight change.  HENT: Negative for trouble swallowing and voice change.   Eyes: Negative for visual disturbance.  Respiratory: Negative for cough, shortness of breath and wheezing.   Cardiovascular: Negative for chest pain, palpitations and leg swelling.  Gastrointestinal: Negative for nausea, vomiting and diarrhea.  Endocrine: Negative for cold intolerance, heat intolerance, polydipsia, polyphagia and polyuria.  Musculoskeletal: Negative for myalgias and arthralgias.  Skin: Negative for color change, pallor, rash and wound.  Neurological: Negative for seizures and headaches.  Psychiatric/Behavioral: Negative for suicidal ideas and confusion.    Objective:     BP 135/83 mmHg  Pulse 92  Ht 5' (1.524 m)  Wt 208 lb (94.348 kg)  BMI 40.62 kg/m2  SpO2 97%  Wt Readings from Last 3 Encounters:  02/03/16 208 lb (94.348 kg)  01/03/16 214 lb (97.07 kg)  12/30/15 207 lb 12.8 oz (94.257 kg)    Physical Exam  Constitutional: She is oriented to person, place, and time. She appears well-developed.  HENT:  Head: Normocephalic and atraumatic.  Eyes: EOM are normal.  Neck: Normal range of motion. Neck supple. No tracheal deviation present. No thyromegaly present.  Cardiovascular: Normal rate and regular rhythm.   Pulmonary/Chest: Effort normal and breath sounds normal.  Abdominal: Soft. Bowel sounds are normal. There is no tenderness. There is no guarding.  Musculoskeletal: Normal range of motion. She exhibits no edema.  Neurological: She is alert and oriented to person, place, and time. She has normal reflexes. No cranial nerve deficit. Coordination normal.  Skin: Skin is warm and dry. No rash noted. No erythema. No pallor.  Psychiatric: She has a normal mood and affect. Judgment normal.    Results for orders placed or performed in visit on [id number]  Basic metabolic panel  Result Value Ref Range   Sodium 139 135 -  146 mmol/L   Potassium 3.9 3.5 - 5.3 mmol/L   Chloride 102 98 - 110 mmol/L   CO2 28 20 - 31 mmol/L   Glucose, Bld 175 (H) 65 - 99 mg/dL   BUN 21 7 - 25 mg/dL   Creat 0.64 0.50 - 1.05 mg/dL   Calcium 9.6 8.6 - 10.4 mg/dL  Lipid panel  Result Value Ref Range   Cholesterol 175 125 - 200 mg/dL   Triglycerides 214 (H) <150 mg/dL   HDL 45 (L) >=46 mg/dL   Total CHOL/HDL Ratio 3.9 <=5.0 Ratio   VLDL 43 (H) <30 mg/dL   LDL Cholesterol 87 <130 mg/dL  Hemoglobin A1c  Result Value Ref Range   Hgb A1c MFr Bld 7.6 (H) <5.7 %   Mean Plasma Glucose 171 (H) <117 mg/dL   Diabetic Labs (most recent): Lab Results  Component Value Date   HGBA1C 7.6* 02/01/2016   HGBA1C 7.3* 10/12/2015   HGBA1C 9.6 08/27/2013   Lipid Panel     Component  Value Date/Time   CHOL 175 02/01/2016 0706   TRIG 214* 02/01/2016 0706   HDL 45* 02/01/2016 0706   CHOLHDL 3.9 02/01/2016 0706   VLDL 43* 02/01/2016 0706   LDLCALC 87 02/01/2016 0706     Assessment & Plan:   1. Uncontrolled type 2 diabetes mellitus with complication, with long-term current use of insulin ([place]) -  patient remains at a high risk for more acute and chronic complications of diabetes which include CAD, CVA, CKD, retinopathy, and neuropathy. These are all discussed in detail with the patient.  Patient came with controlled fasting glucose profile, and  recent A1c of 7.6 %.  Glucose logs and insulin administration records pertaining to this visit,  to be scanned into patient's records.  Recent labs reviewed.   - I have re-counseled the patient on diet management and weight loss  by adopting a carbohydrate restricted / protein rich  Diet.  - Suggestion is made for patient to avoid simple carbohydrates   from their diet including Cakes , Desserts, Ice Cream,  Soda (  diet and regular) , Sweet Tea , Candies,  Chips, Cookies, Artificial Sweeteners,   and "Sugar-free" Products .  This will help patient to have stable blood glucose profile and potentially avoid unintended  Weight gain.  - Patient is advised to stick to a routine mealtimes to eat 3 meals  a day and avoid unnecessary snacks ( to snack only to correct hypoglycemia).  - I have approached patient with the following individualized plan to manage diabetes and patient agrees.  -She will  Increase Lantus to 46 units daily at bedtime , she will not need prandial insulin for now. - I will continue Jardiance to 25 mg po qam. -Continue  Trulicity 1.5mg  weekly.   - Patient specific target  for A1c; LDL, HDL, Triglycerides, and  Waist Circumference were discussed in detail.  2) BP/HTN: controlled. Continue current medications including ACEI/ARB. 3) Lipids/HPL:  LDL at 87, continue statins, and gemfibrozil.  Insurance did not  cover Lovaza.  4)  Weight/Diet: exercise, and carbohydrates information provided.  5) Chronic Care/Health Maintenance:  -Patient is on ACEI/ARB and Statin medications and encouraged to continue to follow up with Ophthalmology, Podiatrist at least yearly or according to recommendations, and advised to  stay away from smoking. I have recommended yearly flu vaccine and pneumonia vaccination at least every 5 years; moderate intensity exercise for up to 150 minutes weekly; and  sleep for at least  7 hours a day.  - 25 minutes of time was spent on the care of this patient , 50% of which was applied for counseling on diabetes complications and their preventions.  - I advised patient to maintain close follow up with Mickie Hillier, MD for primary care needs.  Patient is asked to bring meter and  blood glucose logs during their next visit.   Follow up plan: -Return in about 3 months (around 05/05/2016) for diabetes, high blood pressure, high cholesterol, follow up with pre-visit labs, meter, and logs.  Glade Lloyd, MD Phone: [phone]  Fax: [phone]   02/03/2016, 4:37 PM

## 2016-02-07 ENCOUNTER — Ambulatory Visit (INDEPENDENT_AMBULATORY_CARE_PROVIDER_SITE_OTHER): Payer: BLUE CROSS/BLUE SHIELD | Admitting: Family Medicine

## 2016-02-07 ENCOUNTER — Encounter: Payer: Self-pay | Admitting: Family Medicine

## 2016-02-07 VITALS — BP 138/80 | Ht 60.5 in | Wt 206.0 lb

## 2016-02-07 DIAGNOSIS — J4531 Mild persistent asthma with (acute) exacerbation: Secondary | ICD-10-CM | POA: Diagnosis not present

## 2016-02-07 DIAGNOSIS — I10 Essential (primary) hypertension: Secondary | ICD-10-CM

## 2016-02-07 DIAGNOSIS — G609 Hereditary and idiopathic neuropathy, unspecified: Secondary | ICD-10-CM

## 2016-02-07 DIAGNOSIS — E785 Hyperlipidemia, unspecified: Secondary | ICD-10-CM | POA: Diagnosis not present

## 2016-02-07 MED ORDER — ENALAPRIL MALEATE 10 MG PO TABS: ORAL_TABLET | ORAL | Status: DC

## 2016-02-07 MED ORDER — GABAPENTIN 400 MG PO CAPS: ORAL_CAPSULE | ORAL | Status: DC

## 2016-02-07 MED ORDER — FUROSEMIDE 40 MG PO TABS: 40.0000 mg | ORAL_TABLET | Freq: Every morning | ORAL | Status: DC

## 2016-02-07 MED ORDER — NORTRIPTYLINE HCL 50 MG PO CAPS: ORAL_CAPSULE | ORAL | Status: DC

## 2016-02-07 NOTE — Progress Notes (Signed)
   Subjective:    Patient ID: Lisa Burns, female    DOB: August 12, 1961, 55 y.o.   MRN: [id number] Pt on schedule for wellness. Sees a gyn in  for physicals.  Hypertension This is a chronic problem. The current episode started more than 1 year ago. There are no compliance problems (eats healthy and exercises).   sees Dr. Dorris Fetch for diabetes., bp good when cked elsewhere  working on exercise pretty well  Pt likes to dance,   Starting April 1st insurance will not cover pulmicort inhaler. Need to have it switched to something different. Pt forgot form with alternative. She will call back with name of med insurance will cover.  Needs dfinitely  States his steroid inhaler has definitely helped     compliant with blood pressure medication. No obvious side effects. Has cut down salt intake. Does not miss a dose of meds.   still using Pamelor for neuropathic discomfort. Reports that it helps  patient concerned about swelling in her feet.   Review of Systems  no headache no chest pain no back pain no abdominal pain no change in bowel habits    Objective:   Physical Exam   alert vitals stable blood pressure good on repeat H&T normal lungs clear heart rare rhythm ankles diminished feet sensation distally  trace edema both feet left greater than right her to a pulses good.  blood work reviewed liver function and renal function thyroid all excellent    Assessment & Plan:   impression 1 hypertension good control discussed #2 peripheral edema likely venous stasis discussed length #3 asthma with ongoing need for steroid inhaler patient get back with [place] on her preferred method #4 neuropathic pain discussed plan medications refilled diet exercise discussed blood work discussed recheck in 6 months [facility] colonoscopy strongly encouraged

## 2016-02-08 ENCOUNTER — Other Ambulatory Visit: Payer: Self-pay | Admitting: *Deleted

## 2016-02-08 ENCOUNTER — Telehealth: Payer: Self-pay | Admitting: Family Medicine

## 2016-02-08 MED ORDER — FLUTICASONE PROPIONATE HFA 110 MCG/ACT IN AERO: 2.0000 | INHALATION_SPRAY | Freq: Two times a day (BID) | RESPIRATORY_TRACT | Status: DC

## 2016-02-08 MED ORDER — CEFPROZIL 500 MG PO TABS: 500.0000 mg | ORAL_TABLET | Freq: Two times a day (BID) | ORAL | Status: DC

## 2016-02-08 NOTE — Telephone Encounter (Signed)
1) Pt see yesterday was told to call back with med that the insurance will Cover from a letter she got from them   They gave Arnuity Ellipta, Flovent HFA, Flovent Diskus, Qvar  Pick one she says and send it to   [facility]   2) She states when she saw you yesterday that you spoke about her congestion She states today she is tons worse and would like for you to go ahead and call in  something for this as well

## 2016-02-08 NOTE — Telephone Encounter (Signed)
meds sent to pharm. Pt notified.  

## 2016-02-08 NOTE — Telephone Encounter (Signed)
Pt seen yesterday. Coughing up green mucus. Started this am. No fever. Wheezing. She is using her inhaler. Chest pain when coughing. Also see note below about inhaler that insurance will cover.

## 2016-02-08 NOTE — Telephone Encounter (Signed)
cefzil 500 bid ten d  Flovent 110 mcg two puffs bid HFA 11 ref

## 2016-02-09 ENCOUNTER — Ambulatory Visit: Payer: BLUE CROSS/BLUE SHIELD | Admitting: Neurology

## 2016-02-22 ENCOUNTER — Telehealth: Payer: Self-pay | Admitting: Family Medicine

## 2016-02-22 MED ORDER — DOXYCYCLINE HYCLATE 100 MG PO TABS: ORAL_TABLET | ORAL | Status: DC

## 2016-02-22 NOTE — Telephone Encounter (Signed)
Spoke with patient and informed her per Dr.Steve Luking we are sending over Doxycycline 100 mg one tablet twice a day for 10 days. Patient verbalized understanding

## 2016-02-22 NOTE — Telephone Encounter (Signed)
Patient was prescribed a 10 day round of cefzil on 02/08/2016 for chest congestion, but now she said it has moved to her head.  She wants to know if we can call in a new Rx for another antibiotic to [facility].

## 2016-02-22 NOTE — Telephone Encounter (Signed)
Doxy 100 bid ten d 

## 2016-02-22 NOTE — Telephone Encounter (Signed)
Patient states that she is having head congestion and it is green in color. Patient states that her cough is a lot better. No other symptoms. Patient needs another antibiotic sent in.

## 2016-03-28 ENCOUNTER — Telehealth: Payer: Self-pay

## 2016-03-28 NOTE — Telephone Encounter (Signed)
[phone]  PATIENT RECEIVED LETTER TO SCHEDULE TCS   PLEASE CALL

## 2016-03-30 ENCOUNTER — Telehealth: Payer: Self-pay

## 2016-03-30 NOTE — Telephone Encounter (Signed)
Triaged pt today.

## 2016-04-03 NOTE — Telephone Encounter (Signed)
Gastroenterology Pre-Procedure Review  Request Date: 03/30/2016 Requesting Physician: Dr. Mickie Hillier  PATIENT REVIEW QUESTIONS: The patient responded to the following health history questions as indicated:    1. Diabetes Melitis: YES 2. Joint replacements in the past 12 months: no 3. Major health problems in the past 3 months: no 4. Has an artificial valve or MVP: no 5. Has a defibrillator: no 6. Has been advised in past to take antibiotics in advance of a procedure like teeth cleaning: no 7. Family history of colon cancer: no  8. Alcohol Use: no 9. History of sleep apnea: no     MEDICATIONS & ALLERGIES:    Patient reports the following regarding taking any blood thinners:   Plavix? no Aspirin? no Coumadin? no  Patient confirms/reports the following medications:  Current Outpatient Prescriptions  Medication Sig Dispense Refill  . Dulaglutide (TRULICITY) 1.5 0000000 SOPN Inject 1.5 mg into the skin once a week. 4 mL 2  . empagliflozin (JARDIANCE) 25 MG TABS tablet Take 25 mg by mouth every morning. 30 tablet 2  . enalapril (VASOTEC) 10 MG tablet TAKE ONE (1) TABLET AT BEDTIME 90 tablet 1  . fluticasone (FLONASE) 50 MCG/ACT nasal spray Place 1 spray into both nostrils daily. 16 g 5  . fluticasone (FLOVENT HFA) 110 MCG/ACT inhaler Inhale 2 puffs into the lungs 2 (two) times daily. 1 Inhaler 11  . furosemide (LASIX) 40 MG tablet Take 1 tablet (40 mg total) by mouth every morning. 90 tablet 1  . gabapentin (NEURONTIN) 400 MG capsule TAKE ONE (1) CAPSULE THREE (3) TIMES EACH DAY 270 capsule 1  . Insulin Glargine (LANTUS SOLOSTAR) 100 UNIT/ML Solostar Pen Inject 46 Units into the skin daily at 10 pm. 15 mL 2  . lansoprazole (PREVACID) 15 MG capsule Take 1 capsule (15 mg total) by mouth daily. 30 capsule 5  . loratadine (CLARITIN) 10 MG tablet Take 10 mg by mouth. Take two everyday    . nortriptyline (PAMELOR) 50 MG capsule Take 2 capsules po qhs 180 capsule 1  . valACYclovir (VALTREX)  500 MG tablet     . VENTOLIN HFA 108 (90 Base) MCG/ACT inhaler USE TWO SPRAYS EVERY FOUR HOURS AS NEEDED FOR WHEEZING 18 g 0  . cefPROZIL (CEFZIL) 500 MG tablet Take 1 tablet (500 mg total) by mouth 2 (two) times daily. (Patient not taking: Reported on 03/30/2016) 20 tablet 0  . doxycycline (VIBRA-TABS) 100 MG tablet Take 1 tablet by mouth twice a day for 10 days (Patient not taking: Reported on 03/30/2016) 20 tablet 0  . glucose blood test strip Use as instructed 100 each 12   No current facility-administered medications for this visit.    Patient confirms/reports the following allergies:  Allergies  Allergen Reactions  . Augmentin [Amoxicillin-Pot Clavulanate]     GI upset  . Januvia [Sitagliptin]   . Metformin And Related     GI upset    No orders of the defined types were placed in this encounter.    AUTHORIZATION INFORMATION Primary Insurance:   ID #:  Group #:  Pre-Cert / Auth required: Pre-Cert / Auth #:   Secondary Insurance:   ID #:   Group #:  Pre-Cert / Auth required:  Pre-Cert / Auth #:   SCHEDULE INFORMATION: Procedure has been scheduled as follows:  Date: 04/24/2016                  Time:  8:30 Am Location: [facility] Short Stay  This Gastroenterology  Pre-Precedure Review Form is being routed to the following provider(s): R. Garfield Cornea, MD

## 2016-04-03 NOTE — Telephone Encounter (Signed)
See separate triage.  

## 2016-04-09 NOTE — Telephone Encounter (Signed)
MOVI PREP SPLIT DOSING, REGULAR BREAKFAST. CLEAR LIQUIDS AFTER 9 AM.  HALF LANTUS ON NIGHT PRIOR TO TCS. HOLD JARDIANCE THE AM OF PROCEDURE.

## 2016-04-11 ENCOUNTER — Other Ambulatory Visit: Payer: Self-pay

## 2016-04-11 DIAGNOSIS — Z1211 Encounter for screening for malignant neoplasm of colon: Secondary | ICD-10-CM

## 2016-04-12 DIAGNOSIS — Z6841 Body Mass Index (BMI) 40.0 and over, adult: Secondary | ICD-10-CM | POA: Diagnosis not present

## 2016-04-12 DIAGNOSIS — Z01419 Encounter for gynecological examination (general) (routine) without abnormal findings: Secondary | ICD-10-CM | POA: Diagnosis not present

## 2016-04-12 DIAGNOSIS — Z1231 Encounter for screening mammogram for malignant neoplasm of breast: Secondary | ICD-10-CM | POA: Diagnosis not present

## 2016-04-12 MED ORDER — PEG-KCL-NACL-NASULF-NA ASC-C 100 G PO SOLR: 1.0000 | ORAL | Status: DC

## 2016-04-12 NOTE — Telephone Encounter (Signed)
Rx sent to the pharmacy and instructions mailed to pt.  

## 2016-04-19 ENCOUNTER — Telehealth: Payer: Self-pay

## 2016-04-19 NOTE — Telephone Encounter (Signed)
I called [employer] @ [phone] and spoke to [place] who said a PA is not required for the screening colonoscopy as out patient. She said I did not need a reference number, they do not use them and as I was asking for the first initial of her last name she hung up on me.

## 2016-04-20 NOTE — Telephone Encounter (Signed)
CALLED PHARMACY-CONFIRMED WITH [facility]-NO INTERACTION BETWEEN MOVI-PREP AND AUGMENTIN.

## 2016-04-24 ENCOUNTER — Ambulatory Visit (HOSPITAL_COMMUNITY)
Admission: RE | Admit: 2016-04-24 | Discharge: 2016-04-24 | Disposition: A | Discharge status: Home / Self Care | Payer: BLUE CROSS/BLUE SHIELD | Source: Ambulatory Visit | Attending: Gastroenterology | Admitting: Gastroenterology

## 2016-04-24 ENCOUNTER — Encounter (HOSPITAL_COMMUNITY)
Admission: RE | Disposition: A | Discharge status: Home / Self Care | Payer: Self-pay | Source: Ambulatory Visit | Attending: Gastroenterology

## 2016-04-24 ENCOUNTER — Encounter (HOSPITAL_COMMUNITY): Payer: Self-pay | Admitting: *Deleted

## 2016-04-24 DIAGNOSIS — K219 Gastro-esophageal reflux disease without esophagitis: Secondary | ICD-10-CM | POA: Diagnosis not present

## 2016-04-24 DIAGNOSIS — D123 Benign neoplasm of transverse colon: Secondary | ICD-10-CM | POA: Insufficient documentation

## 2016-04-24 DIAGNOSIS — I10 Essential (primary) hypertension: Secondary | ICD-10-CM | POA: Diagnosis not present

## 2016-04-24 DIAGNOSIS — Z1211 Encounter for screening for malignant neoplasm of colon: Secondary | ICD-10-CM | POA: Insufficient documentation

## 2016-04-24 DIAGNOSIS — J45909 Unspecified asthma, uncomplicated: Secondary | ICD-10-CM | POA: Insufficient documentation

## 2016-04-24 DIAGNOSIS — Z794 Long term (current) use of insulin: Secondary | ICD-10-CM | POA: Insufficient documentation

## 2016-04-24 DIAGNOSIS — Z87891 Personal history of nicotine dependence: Secondary | ICD-10-CM | POA: Insufficient documentation

## 2016-04-24 DIAGNOSIS — E1140 Type 2 diabetes mellitus with diabetic neuropathy, unspecified: Secondary | ICD-10-CM | POA: Diagnosis not present

## 2016-04-24 DIAGNOSIS — D124 Benign neoplasm of descending colon: Secondary | ICD-10-CM | POA: Diagnosis not present

## 2016-04-24 DIAGNOSIS — K621 Rectal polyp: Secondary | ICD-10-CM | POA: Diagnosis not present

## 2016-04-24 DIAGNOSIS — D128 Benign neoplasm of rectum: Secondary | ICD-10-CM | POA: Diagnosis not present

## 2016-04-24 DIAGNOSIS — Z79899 Other long term (current) drug therapy: Secondary | ICD-10-CM | POA: Diagnosis not present

## 2016-04-24 DIAGNOSIS — K648 Other hemorrhoids: Secondary | ICD-10-CM | POA: Insufficient documentation

## 2016-04-24 HISTORY — PX: COLONOSCOPY: SHX5424

## 2016-04-24 LAB — GLUCOSE, CAPILLARY: GLUCOSE-CAPILLARY: 136 mg/dL — AB (ref 65–99)

## 2016-04-24 SURGERY — COLONOSCOPY
Anesthesia: Moderate Sedation

## 2016-04-24 MED ORDER — MIDAZOLAM HCL 5 MG/5ML IJ SOLN
INTRAMUSCULAR | Status: DC | PRN
Start: 2016-04-24 — End: 2016-04-24
  Administered 2016-04-24 (×2): 2 mg via INTRAVENOUS

## 2016-04-24 MED ORDER — MEPERIDINE HCL 100 MG/ML IJ SOLN
INTRAMUSCULAR | Status: AC
  Filled 2016-04-24: qty 2

## 2016-04-24 MED ORDER — MEPERIDINE HCL 100 MG/ML IJ SOLN
INTRAMUSCULAR | Status: DC | PRN
  Administered 2016-04-24: 50 mg via INTRAVENOUS
  Administered 2016-04-24: 25 mg via INTRAVENOUS

## 2016-04-24 MED ORDER — SIMETHICONE 40 MG/0.6ML PO SUSP
ORAL | Status: DC | PRN
  Administered 2016-04-24: 09:00:00

## 2016-04-24 MED ORDER — MIDAZOLAM HCL 5 MG/5ML IJ SOLN
INTRAMUSCULAR | Status: AC
  Filled 2016-04-24: qty 10

## 2016-04-24 MED ORDER — SODIUM CHLORIDE 0.9 % IV SOLN
INTRAVENOUS | Status: DC
  Administered 2016-04-24: 08:00:00 via INTRAVENOUS

## 2016-04-24 NOTE — Discharge Instructions (Signed)
You had 4 polyps removed. You have small nternal AND MODERATE EXTERNAL HEMORRHOIDS.    DRINK WATER TO KEEP YOUR URINE LIGHT YELLOW.  FOLLOW A HIGH FIBER DIET. AVOID ITEMS THAT CAUSE BLOATING & GAS. SEE INFO BELOW.  CONTINUE YOUR WEIGHT LOSS EFFORTS. LOSE TEN POUNDS. YOUR BODY MASS INDEX IS OVER 30 WHICH MEANS YOU ARE OBESE. OBESITY IS ASSOCIATED WITH AN INCREASE FOR ALL CANCERS, INCLUDING ESOPHAGEAL AND COLON CANCER.   YOUR BIOPSY RESULTS WILL BE AVAILABLE IN MY CHART JUN 8 AND MY OFFICE WILL CONTACT YOU IN 10-14 DAYS WITH YOUR RESULTS.   Next colonoscopy in 3-5 years.    Colonoscopy Care After Read the instructions outlined below and refer to this sheet in the next week. These discharge instructions provide you with general information on caring for yourself after you leave the hospital. While your treatment has been planned according to the most current medical practices available, unavoidable complications occasionally occur. If you have any problems or questions after discharge, call DR. Dwight Adamczak, [phone].  ACTIVITY  You may resume your regular activity, but move at a slower pace for the next 24 hours.   Take frequent rest periods for the next 24 hours.   Walking will help get rid of the air and reduce the bloated feeling in your belly (abdomen).   No driving for 24 hours (because of the medicine (anesthesia) used during the test).   You may shower.   Do not sign any important legal documents or operate any machinery for 24 hours (because of the anesthesia used during the test).    NUTRITION  Drink plenty of fluids.   You may resume your normal diet as instructed by your doctor.   Begin with a light meal and progress to your normal diet. Heavy or fried foods are harder to digest and may make you feel sick to your stomach (nauseated).   Avoid alcoholic beverages for 24 hours or as instructed.    MEDICATIONS  You may resume your normal medications.   WHAT YOU  CAN EXPECT TODAY  Some feelings of bloating in the abdomen.   Passage of more gas than usual.   Spotting of blood in your stool or on the toilet paper  .  IF YOU HAD POLYPS REMOVED DURING THE COLONOSCOPY:  Eat a soft diet IF YOU HAVE NAUSEA, BLOATING, ABDOMINAL PAIN, OR VOMITING.    FINDING OUT THE RESULTS OF YOUR TEST Not all test results are available during your visit. DR. Oneida Alar WILL CALL YOU WITHIN 14 DAYS OF YOUR PROCEDUE WITH YOUR RESULTS. Do not assume everything is normal if you have not heard from DR. Dannae Kato, CALL HER OFFICE AT [phone].  SEEK IMMEDIATE MEDICAL ATTENTION AND CALL THE OFFICE: [phone] IF:  You have more than a spotting of blood in your stool.   Your belly is swollen (abdominal distention).   You are nauseated or vomiting.   You have a temperature over 101F.   You have abdominal pain or discomfort that is severe or gets worse throughout the day.   High-Fiber Diet A high-fiber diet changes your normal diet to include more whole grains, legumes, fruits, and vegetables. Changes in the diet involve replacing refined carbohydrates with unrefined foods. The calorie level of the diet is essentially unchanged. The Dietary Reference Intake (recommended amount) for adult males is 38 grams per day. For adult females, it is 25 grams per day. Pregnant and lactating women should consume 28 grams of fiber per day. Fiber is the  intact part of a plant that is not broken down during digestion. Functional fiber is fiber that has been isolated from the plant to provide a beneficial effect in the body. PURPOSE  Increase stool bulk.   Ease and regulate bowel movements.   Lower cholesterol.  REDUCE RISK OF COLON CANCER  INDICATIONS THAT YOU NEED MORE FIBER  Constipation and hemorrhoids.   Uncomplicated diverticulosis (intestine condition) and irritable bowel syndrome.   Weight management.   As a protective measure against hardening of the arteries  (atherosclerosis), diabetes, and cancer.   GUIDELINES FOR INCREASING FIBER IN THE DIET  Start adding fiber to the diet slowly. A gradual increase of about 5 more grams (2 slices of whole-wheat bread, 2 servings of most fruits or vegetables, or 1 bowl of high-fiber cereal) per day is best. Too rapid an increase in fiber may result in constipation, flatulence, and bloating.   Drink enough water and fluids to keep your urine clear or pale yellow. Water, juice, or caffeine-free drinks are recommended. Not drinking enough fluid may cause constipation.   Eat a variety of high-fiber foods rather than one type of fiber.   Try to increase your intake of fiber through using high-fiber foods rather than fiber pills or supplements that contain small amounts of fiber.   The goal is to change the types of food eaten. Do not supplement your present diet with high-fiber foods, but replace foods in your present diet.   INCLUDE A VARIETY OF FIBER SOURCES  Replace refined and processed grains with whole grains, canned fruits with fresh fruits, and incorporate other fiber sources. White rice, white breads, and most bakery goods contain little or no fiber.   Brown whole-grain rice, buckwheat oats, and many fruits and vegetables are all good sources of fiber. These include: broccoli, Brussels sprouts, cabbage, cauliflower, beets, sweet potatoes, white potatoes (skin on), carrots, tomatoes, eggplant, squash, berries, fresh fruits, and dried fruits.   Cereals appear to be the richest source of fiber. Cereal fiber is found in whole grains and bran. Bran is the fiber-rich outer coat of cereal grain, which is largely removed in refining. In whole-grain cereals, the bran remains. In breakfast cereals, the largest amount of fiber is found in those with "bran" in their names. The fiber content is sometimes indicated on the label.   You may need to include additional fruits and vegetables each day.   In baking, for 1 cup  white flour, you may use the following substitutions:   1 cup whole-wheat flour minus 2 tablespoons.   1/2 cup white flour plus 1/2 cup whole-wheat flour.   Polyps, Colon  A polyp is extra tissue that grows inside your body. Colon polyps grow in the large intestine. The large intestine, also called the colon, is part of your digestive system. It is a long, hollow tube at the end of your digestive tract where your body makes and stores stool. Most polyps are not dangerous. They are benign. This means they are not cancerous. But over time, some types of polyps can turn into cancer. Polyps that are smaller than a pea are usually not harmful. But larger polyps could someday become or may already be cancerous. To be safe, doctors remove all polyps and test them.   PREVENTION There is not one sure way to prevent polyps. You might be able to lower your risk of getting them if you:  Eat more fruits and vegetables and less fatty food.   Do not smoke.  Avoid alcohol.   Exercise every day.   Lose weight if you are overweight.   Eating more calcium and folate can also lower your risk of getting polyps. Some foods that are rich in calcium are milk, cheese, and broccoli. Some foods that are rich in folate are chickpeas, kidney beans, and spinach.   Hemorrhoids Hemorrhoids are dilated (enlarged) veins around the rectum. Sometimes clots will form in the veins. This makes them swollen and painful. These are called thrombosed hemorrhoids. Causes of hemorrhoids include:  Constipation.   Straining to have a bowel movement.   HEAVY LIFTING  HOME CARE INSTRUCTIONS  Eat a well balanced diet and drink 6 to 8 glasses of water every day to avoid constipation. You may also use a bulk laxative.   Avoid straining to have bowel movements.   Keep anal area dry and clean.   Do not use a donut shaped pillow or sit on the toilet for long periods. This increases blood pooling and pain.   Move your bowels  when your body has the urge; this will require less straining and will decrease pain and pressure.

## 2016-04-24 NOTE — H&P (Signed)
Primary Care Physician:  Mickie Hillier, MD Primary Gastroenterologist:  Dr. Oneida Alar  Pre-Procedure History & Physical: HPI:  Lisa Burns is a 55 y.o. female here for [place].  Past Medical History  Diagnosis Date  . Hypertension   . Asthma   . Allergy   . GERD (gastroesophageal reflux disease)   . Diabetes mellitus without complication ([place])   . Diabetes mellitus with neuropathy ([place])   . Microproteinuria   . Venous stasis   . Reflux     Past Surgical History  Procedure Laterality Date  . Other surgical history      gerd surgery  . Cesarean section    . Throat surgery  2000  . Laparoscopic nissen fundoplication      Prior to Admission medications   Medication Sig Start Date End Date Taking? Authorizing Provider  Dulaglutide (TRULICITY) 1.5 LF/8.1OF SOPN Inject 1.5 mg into the skin once a week. 02/03/16  Yes Cassandria Anger, MD  empagliflozin (JARDIANCE) 25 MG TABS tablet Take 25 mg by mouth every morning. 02/03/16  Yes Cassandria Anger, MD  enalapril (VASOTEC) 10 MG tablet TAKE ONE (1) TABLET AT BEDTIME 02/07/16  Yes Mikey Kirschner, MD  fluticasone [facility]) 50 MCG/ACT nasal spray Place 1 spray into both nostrils daily. 11/19/14  Yes Mikey Kirschner, MD  fluticasone (FLOVENT HFA) 110 MCG/ACT inhaler Inhale 2 puffs into the lungs 2 (two) times daily. 02/08/16  Yes Mikey Kirschner, MD  furosemide (LASIX) 40 MG tablet Take 1 tablet (40 mg total) by mouth every morning. 02/07/16  Yes Mikey Kirschner, MD  gabapentin (NEURONTIN) 400 MG capsule TAKE ONE (1) CAPSULE THREE (3) TIMES EACH DAY 02/07/16  Yes Mikey Kirschner, MD  glucose blood test strip Use as instructed 02/26/13  Yes Mikey Kirschner, MD  Insulin Glargine (LANTUS SOLOSTAR) 100 UNIT/ML Solostar Pen Inject 46 Units into the skin daily at 10 pm. 02/03/16  Yes Cassandria Anger, MD  lansoprazole (PREVACID) 15 MG capsule Take 1 capsule (15 mg total) by mouth daily. 09/23/14  Yes Mikey Kirschner, MD   loratadine (CLARITIN) 10 MG tablet Take 10 mg by mouth. Take two everyday   Yes Historical Provider, MD  nortriptyline (PAMELOR) 50 MG capsule Take 2 capsules po qhs 02/07/16  Yes Mikey Kirschner, MD  peg 3350 powder (MOVIPREP) 100 g SOLR Take 1 kit (200 g total) by mouth as directed. 04/12/16  Yes Danie Binder, MD  VENTOLIN HFA 108 (90 Base) MCG/ACT inhaler USE TWO SPRAYS EVERY FOUR HOURS AS NEEDED FOR WHEEZING 12/03/15  Yes Mikey Kirschner, MD  valACYclovir (VALTREX) 500 MG tablet Take 500 mg by mouth daily as needed (cold sore flare ups).  03/05/13   Historical Provider, MD    Allergies as of 04/11/2016 - Review Complete 04/11/2016  Allergen Reaction Noted  . Augmentin [amoxicillin-pot clavulanate]  02/26/2013  . Januvia [sitagliptin]  11/04/2015  . Metformin and related  04/29/2013    Family History  Problem Relation Age of Onset  . Diabetes Mother   . Heart disease Father   . Cancer Father     lung  . Colon cancer Father   . Heart disease Sister   . Diabetes Sister   . Heart disease Brother   . Diabetes Brother   . Colon cancer Paternal Grandmother     Social History   Social History  . Marital Status: Married    Spouse Name: N/A  . Number of  Children: 1  . Years of Education: HS   Occupational History  . [employer]     Social History Main Topics  . Smoking status: Former Smoker -- 1.00 packs/day for 5 years    Types: Cigarettes  . Smokeless tobacco: Not on file  . Alcohol Use: 0.0 oz/week    0 Standard drinks or equivalent per week     Comment: occasionally  . Drug Use: No  . Sexual Activity: Not on file   Other Topics Concern  . Not on file   Social History Narrative   Drinks 2 cans of soda a day     Review of Systems: See HPI, otherwise negative ROS   Physical Exam: BP 134/58 mmHg  Pulse 87  Temp(Src) 98.7 F (37.1 C) (Oral)  Resp 17  Ht 5' (1.524 m)  Wt 203 lb (92.08 kg)  BMI 39.65 kg/m2  SpO2 93% General:   Alert,  pleasant and  cooperative in NAD Head:  Normocephalic and atraumatic. Neck:  Supple; Lungs:  Clear throughout to auscultation.    Heart:  Regular rate and rhythm. Abdomen:  Soft, nontender and nondistended. Normal bowel sounds, without guarding, and without rebound.   Neurologic:  Alert and  oriented x4;  grossly normal neurologically.  Impression/Plan:     SCREENING  Plan:  1. TCS TODAY

## 2016-04-24 NOTE — Op Note (Signed)
[facility] Patient Name: Lisa Burns Procedure Date: 04/24/2016 7:10 AM MRN: [id number] Date of Birth: 1961/01/01 Attending MD: Barney Drain , MD CSN: [id number] Age: 55 Admit Type: Outpatient Procedure:                Colonoscopy WITH COLD FORCEPS AND SNARE CAUTERY                            POLYPECTOMY Indications:              Screening for colorectal malignant neoplasm Providers:                Barney Drain, MD, Rosina Lowenstein, RN, Zoila Shutter,                            Technologist Referring MD:             Rosemary Holms, MD Medicines:                Meperidine 75 mg IV, Midazolam 4 mg IV Complications:            No immediate complications. Estimated Blood Loss:     Estimated blood loss was minimal. Procedure:                Pre-Anesthesia Assessment:                           - Prior to the procedure, a History and Physical                            was performed, and patient medications and                            allergies were reviewed. The patient's tolerance of                            previous anesthesia was also reviewed. The risks                            and benefits of the procedure and the sedation                            options and risks were discussed with the patient.                            All questions were answered, and informed consent                            was obtained. Prior Anticoagulants: The patient has                            taken no previous anticoagulant or antiplatelet                            agents. ASA Grade Assessment: II - A patient with  mild systemic disease. After reviewing the risks                            and benefits, the patient was deemed in                            satisfactory condition to undergo the procedure.                           After obtaining informed consent, the colonoscope                            was passed under direct vision. Throughout the                procedure, the patient's blood pressure, pulse, and                            oxygen saturations were monitored continuously. The                            EC-[id number]) scope was introduced through                            the anus and advanced to the the cecum, identified                            by palpation. The ileocecal valve, appendiceal                            orifice, and rectum were photographed. The                            colonoscopy was performed without difficulty. The                            quality of the bowel preparation was excellent. Scope In: 8:56:53 AM Scope Out: 9:15:22 AM Scope Withdrawal Time: 0 hours 15 minutes 6 seconds  Total Procedure Duration: 0 hours 18 minutes 29 seconds  Findings:      Non-bleeding internal hemorrhoids were found. The hemorrhoids were small.      Three sessile polyps were found in the rectum, descending colon and       transverse colon. The polyps were 2 to 4 mm in size. These polyps were       removed with a cold biopsy forceps. Resection and retrieval were       complete.      A 6 mm polyp was found in the transverse colon. The polyp was sessile.       The polyp was removed with a hot snare. Resection and retrieval were       complete. Impression:               - Non-bleeding internal hemorrhoids.                           - FOUR COLORECTAL POLYPS REMOVED Moderate Sedation:  Moderate (conscious) sedation was administered by the endoscopy nurse       and supervised by the endoscopist. The following parameters were       monitored: oxygen saturation, heart rate, blood pressure, and response       to care. Total physician intraservice time was 31 minutes. Recommendation:           - High fiber diet.                           - Continue present medications.                           - Await pathology results.                           - Repeat colonoscopy in 3 - 5 years for                             surveillance.                           DRINK WATER TO KEEP YOUR URINE LIGHT YELLOW.                           FOLLOW A HIGH FIBER DIET. AVOID ITEMS THAT CAUSE                            BLOATING & GAS. SEE INFO BELOW.                           CONTINUE WEIGHT LOSS EFFORTS. LOSE TEN POUNDS. A                            BODY MASS INDEX IS OVER 30 WHICH MEANS YOU ARE                            OBESE. OBESITY IS ASSOCIATED WITH AN INCREASE FOR                            ALL CANCERS, INCLUDING ESOPHAGEAL AND COLON CANCER.                           - Patient has a contact number available for                            emergencies. The signs and symptoms of potential                            delayed complications were discussed with the                            patient. Return to normal activities tomorrow.                            Written discharge instructions were  provided to the                            patient. Procedure Code(s):        --- Professional ---                           [phone], Colonoscopy, flexible; with removal of                            tumor(s), polyp(s), or other lesion(s) by snare                            technique                           45380, 59, Colonoscopy, flexible; with biopsy,                            single or multiple                           99153, Moderate sedation services; each additional                            15 minutes intraservice time                           G0500, Moderate sedation services provided by the                            same physician or other qualified health care                            professional performing a gastrointestinal                            endoscopic service that sedation supports,                            requiring the presence of an independent trained                            observer to assist in the monitoring of the                            patient's level of consciousness and  physiological                            status; initial 15 minutes of intra-service time;                            patient age 62 years or older (additional time may                            be reported with [phone], as appropriate) Diagnosis Code(s):        ---  Professional ---                           Z12.11, Encounter for screening for malignant                            neoplasm of colon                           K62.1, Rectal polyp                           D12.4, Benign neoplasm of descending colon                           D12.3, Benign neoplasm of transverse colon (hepatic                            flexure or splenic flexure)                           K64.8, Other hemorrhoids CPT copyright 2016 American Medical Association. All rights reserved. The codes documented in this report are preliminary and upon coder review may  be revised to meet current compliance requirements. Barney Drain, MD Barney Drain, MD 04/24/2016 4:33:47 PM This report has been signed electronically. Number of Addenda: 0

## 2016-04-26 ENCOUNTER — Encounter (HOSPITAL_COMMUNITY): Payer: Self-pay | Admitting: Gastroenterology

## 2016-05-08 ENCOUNTER — Other Ambulatory Visit: Payer: Self-pay | Admitting: "Endocrinology

## 2016-05-11 ENCOUNTER — Telehealth: Payer: Self-pay | Admitting: Gastroenterology

## 2016-05-11 NOTE — Telephone Encounter (Signed)
Please call pt. She had MORE THAN 3 simple adenomas removed from her colon.     DRINK WATER TO KEEP YOUR URINE LIGHT YELLOW. FOLLOW A HIGH FIBER DIET. AVOID ITEMS THAT CAUSE BLOATING & GAS.  CONTINUE YOUR WEIGHT LOSS EFFORTS. LOSE TEN POUNDS. YOUR BODY MASS INDEX IS OVER 30 WHICH MEANS YOU ARE OBESE. OBESITY IS ASSOCIATED WITH AN INCREASE FOR ALL CANCERS, INCLUDING ESOPHAGEAL AND COLON CANCER.  Next colonoscopy in 3 years.

## 2016-05-11 NOTE — Telephone Encounter (Signed)
Reminder in epic °

## 2016-05-11 NOTE — Telephone Encounter (Signed)
Pt is aware of results. 

## 2016-05-12 ENCOUNTER — Ambulatory Visit: Payer: BLUE CROSS/BLUE SHIELD | Admitting: "Endocrinology

## 2016-05-22 ENCOUNTER — Other Ambulatory Visit: Payer: Self-pay | Admitting: "Endocrinology

## 2016-06-24 ENCOUNTER — Other Ambulatory Visit: Payer: Self-pay | Admitting: "Endocrinology

## 2016-07-26 ENCOUNTER — Other Ambulatory Visit: Payer: Self-pay | Admitting: "Endocrinology

## 2016-07-26 DIAGNOSIS — Z794 Long term (current) use of insulin: Secondary | ICD-10-CM | POA: Diagnosis not present

## 2016-07-26 DIAGNOSIS — E118 Type 2 diabetes mellitus with unspecified complications: Secondary | ICD-10-CM | POA: Diagnosis not present

## 2016-07-26 DIAGNOSIS — E1165 Type 2 diabetes mellitus with hyperglycemia: Secondary | ICD-10-CM | POA: Diagnosis not present

## 2016-07-26 LAB — BASIC METABOLIC PANEL
BUN: 23 mg/dL (ref 7–25)
CHLORIDE: 101 mmol/L (ref 98–110)
CO2: 23 mmol/L (ref 20–31)
Calcium: 9.6 mg/dL (ref 8.6–10.4)
Creat: 0.78 mg/dL (ref 0.50–1.05)
Glucose, Bld: 221 mg/dL — ABNORMAL HIGH (ref 65–99)
POTASSIUM: 3.7 mmol/L (ref 3.5–5.3)
SODIUM: 135 mmol/L (ref 135–146)

## 2016-07-27 LAB — HEMOGLOBIN A1C
Hgb A1c MFr Bld: 7.2 % — ABNORMAL HIGH (ref <5.7)
Mean Plasma Glucose: 160 mg/dL

## 2016-08-08 ENCOUNTER — Other Ambulatory Visit: Payer: Self-pay | Admitting: "Endocrinology

## 2016-08-09 ENCOUNTER — Ambulatory Visit (INDEPENDENT_AMBULATORY_CARE_PROVIDER_SITE_OTHER): Payer: BLUE CROSS/BLUE SHIELD | Admitting: Family Medicine

## 2016-08-09 ENCOUNTER — Encounter: Payer: Self-pay | Admitting: Family Medicine

## 2016-08-09 VITALS — BP 132/74 | Ht 60.0 in | Wt 212.0 lb

## 2016-08-09 DIAGNOSIS — E785 Hyperlipidemia, unspecified: Secondary | ICD-10-CM | POA: Diagnosis not present

## 2016-08-09 DIAGNOSIS — G609 Hereditary and idiopathic neuropathy, unspecified: Secondary | ICD-10-CM | POA: Diagnosis not present

## 2016-08-09 DIAGNOSIS — J4541 Moderate persistent asthma with (acute) exacerbation: Secondary | ICD-10-CM | POA: Diagnosis not present

## 2016-08-09 DIAGNOSIS — Z23 Encounter for immunization: Secondary | ICD-10-CM | POA: Diagnosis not present

## 2016-08-09 DIAGNOSIS — I10 Essential (primary) hypertension: Secondary | ICD-10-CM | POA: Diagnosis not present

## 2016-08-09 MED ORDER — TRIAMCINOLONE ACETONIDE 0.1 % EX CREA: 1.0000 "application " | TOPICAL_CREAM | Freq: Two times a day (BID) | CUTANEOUS | 0 refills | Status: DC

## 2016-08-09 NOTE — Progress Notes (Signed)
   Subjective:    Patient ID: Lisa Burns, female    DOB: [birth year], 55 y.o.   MRN: [id number]  Hypertension  This is a chronic problem. There are no compliance problems (tries to eat healthy and exercise).    Sees Dr. Dorris Fetch for diabetes.   Pt states no concerns today.   Walked five k sat night,, three out of ven   Blood pressure medicine and blood pressure levels reviewed today with patient. Compliant with blood pressure medicine. States does not miss a dose. No obvious side effects. Blood pressure generally good when checked elsewhere. Watching salt intake.  Patient reports her asthma is overall stable. Notes the preventive steroid inhaler helps calm down symptoms. She rarely uses her rescue inhaler. No obvious side effects. Maintains compliance.  Ongoing challenges with neuropathy. Diabetes associated. Discussed. Patient notes the nighttime nortriptyline does help considerably.    Review of Systems No headache, no major weight loss or weight gain, no chest pain no back pain abdominal pain no change in bowel habits complete ROS otherwise negative     Objective:   Physical Exam  Alert vitals stable, NAD. Blood pressure good on repeat. HEENT normal. Lungs clear. Heart regular rate and rhythm.   C diabetic foot exam      Assessment & Plan:  Impression 1 hypertension good control discussed maintain same meds #2 fairly severe diabetic neuropathy. Discussed. Maintain current medications and strive for ongoing tight diabetes control. #3 asthma ongoing. Controlled well steroid inhalers maintain plan flu shot today. Diet exercise discussed. Medications refilled. Recheck in 6 months W[facility]

## 2016-08-11 ENCOUNTER — Telehealth: Payer: Self-pay | Admitting: "Endocrinology

## 2016-08-11 ENCOUNTER — Other Ambulatory Visit: Payer: Self-pay | Admitting: "Endocrinology

## 2016-08-11 MED ORDER — DULAGLUTIDE 1.5 MG/0.5ML ~~LOC~~ SOAJ: SUBCUTANEOUS | 0 refills | Status: DC

## 2016-08-11 NOTE — Telephone Encounter (Signed)
Called Pharmacy to get Trulicity filled but they said call us - she has an apt Wednesday but needs a refill

## 2016-08-16 ENCOUNTER — Encounter: Payer: Self-pay | Admitting: "Endocrinology

## 2016-08-16 ENCOUNTER — Ambulatory Visit (INDEPENDENT_AMBULATORY_CARE_PROVIDER_SITE_OTHER): Payer: BLUE CROSS/BLUE SHIELD | Admitting: "Endocrinology

## 2016-08-16 VITALS — BP 131/81 | HR 76 | Ht 60.0 in | Wt 211.0 lb

## 2016-08-16 DIAGNOSIS — I10 Essential (primary) hypertension: Secondary | ICD-10-CM

## 2016-08-16 DIAGNOSIS — IMO0002 Reserved for concepts with insufficient information to code with codable children: Secondary | ICD-10-CM

## 2016-08-16 DIAGNOSIS — Z794 Long term (current) use of insulin: Secondary | ICD-10-CM

## 2016-08-16 DIAGNOSIS — E1165 Type 2 diabetes mellitus with hyperglycemia: Secondary | ICD-10-CM | POA: Diagnosis not present

## 2016-08-16 DIAGNOSIS — E785 Hyperlipidemia, unspecified: Secondary | ICD-10-CM | POA: Diagnosis not present

## 2016-08-16 DIAGNOSIS — E118 Type 2 diabetes mellitus with unspecified complications: Secondary | ICD-10-CM

## 2016-08-16 NOTE — Patient Instructions (Signed)
Advice for weight management -For most of us the best way to lose weight is by diet management. Generally speaking, diet management means restricting carbohydrate consumption to minimum possible (and to unprocessed or minimally processed complex starch) and increasing protein intake (animal or plant source), fruits, and vegetables.  -Sticking to a routine mealtime to eat 3 meals a day and avoiding unnecessary snacks is shown to have a big role in weight control.  -It is better to avoid simple carbohydrates including: Cakes, Desserts, Ice Cream, Soda (diet and regular), Sweet Tea, Candies, Chips, Cookies, Artificial Sweeteners, and "Sugar-free" Products.   -Exercise: 30 minutes a day 3-4 days a week, or 150 minutes a week. Combine stretch, strength, and aerobic activities. You may seek evaluation by your heart doctor prior to initiating exercise if you have high risk for heart disease.  -If you are interested, we can schedule a visit with Lisa Burns, RDN, CDE for individualized nutrition education.  

## 2016-08-16 NOTE — Progress Notes (Signed)
Subjective:    Patient ID: Lisa Burns, female    DOB: [birth year], PCP Mickie Hillier, MD   Past Medical History:  Diagnosis Date  . Allergy   . Asthma   . Diabetes mellitus with neuropathy ([place])   . Diabetes mellitus without complication ([place])   . GERD (gastroesophageal reflux disease)   . Hypertension   . Microproteinuria   . Reflux   . Venous stasis    Past Surgical History:  Procedure Laterality Date  . CESAREAN SECTION    . COLONOSCOPY N/A 04/24/2016   Procedure: COLONOSCOPY;  Surgeon: Danie Binder, MD;  Location: [facility];  Service: Endoscopy;  Laterality: N/A;  8:30 Am  . LAPAROSCOPIC NISSEN FUNDOPLICATION    . OTHER SURGICAL HISTORY     gerd surgery  . THROAT SURGERY  2000   Social History   Social History  . Marital status: Married    Spouse name: N/A  . Number of children: 1  . Years of education: HS   Occupational History  . [employer]     Social History Main Topics  . Smoking status: Former Smoker    Packs/day: 1.00    Years: 5.00    Types: Cigarettes  . Smokeless tobacco: Never Used  . Alcohol use 0.0 oz/week     Comment: occasionally  . Drug use: No  . Sexual activity: Not Asked   Other Topics Concern  . None   Social History Narrative   Drinks 2 cans of soda a day    Outpatient Encounter Prescriptions as of 08/16/2016  Medication Sig  . Dulaglutide (TRULICITY) 1.5 0000000 SOPN INJECT 1.5MG  S.Q. ONCE A WEEK  . enalapril (VASOTEC) 10 MG tablet TAKE ONE (1) TABLET AT BEDTIME  . fluticasone (FLONASE) 50 MCG/ACT nasal spray Place 1 spray into both nostrils daily.  . fluticasone (FLOVENT HFA) 110 MCG/ACT inhaler Inhale 2 puffs into the lungs 2 (two) times daily.  . furosemide (LASIX) 40 MG tablet Take 1 tablet (40 mg total) by mouth every morning.  . gabapentin (NEURONTIN) 400 MG capsule TAKE ONE (1) CAPSULE THREE (3) TIMES EACH DAY  . glucose blood test strip Use as instructed  . JARDIANCE 25 MG TABS tablet TAKE ONE TABLET EVERY  MORNING  . lansoprazole (PREVACID) 15 MG capsule Take 1 capsule (15 mg total) by mouth daily.  Marland Kitchen LANTUS SOLOSTAR 100 UNIT/ML Solostar Pen INJECT 46 UNITS DAILY AT 10PM  . loratadine (CLARITIN) 10 MG tablet Take 10 mg by mouth. Take two everyday  . nortriptyline (PAMELOR) 50 MG capsule Take 2 capsules po qhs  . triamcinolone cream (KENALOG) 0.1 % Apply 1 application topically 2 (two) times daily.  . valACYclovir (VALTREX) 500 MG tablet Take 500 mg by mouth daily as needed (cold sore flare ups).   . VENTOLIN HFA 108 (90 Base) MCG/ACT inhaler USE TWO SPRAYS EVERY FOUR HOURS AS NEEDED FOR WHEEZING   No facility-administered encounter medications on file as of 08/16/2016.    ALLERGIES: Allergies  Allergen Reactions  . Augmentin [Amoxicillin-Pot Clavulanate]     GI upset  . Januvia [Sitagliptin]   . Metformin And Related     GI upset   VACCINATION STATUS: Immunization History  Administered Date(s) Administered  . Influenza, Seasonal, Injecte, Preservative Fre 08/09/2016  . Pneumococcal Polysaccharide-23 12/30/2013    Diabetes  She presents for her follow-up diabetic visit. She has type 2 diabetes mellitus. Onset time: She was diagnosed at approximate age of 35 years. Her  disease course has been improving. There are no hypoglycemic associated symptoms. Pertinent negatives for hypoglycemia include no confusion, headaches, pallor or seizures. There are no diabetic associated symptoms. Pertinent negatives for diabetes include no chest pain, no polydipsia, no polyphagia and no polyuria. There are no hypoglycemic complications. Symptoms are improving. There are no diabetic complications. Risk factors for coronary artery disease include diabetes mellitus, dyslipidemia, family history, hypertension, obesity, sedentary lifestyle and tobacco exposure. Current diabetic treatment includes insulin injections and oral agent (monotherapy). She is compliant with treatment most of the time. Her weight is stable.  She is following a generally unhealthy diet. When asked about meal planning, she reported none. She has not had a previous visit with a dietitian. She never participates in exercise. There is no change in her home blood glucose trend. Her breakfast blood glucose range is generally 140-180 mg/dl. An ACE inhibitor/angiotensin II receptor blocker is being taken. Eye exam is current.  Hyperlipidemia  This is a chronic problem. The current episode started more than 1 year ago. Pertinent negatives include no chest pain, myalgias or shortness of breath. Current antihyperlipidemic treatment includes statins. Risk factors for coronary artery disease include diabetes mellitus, dyslipidemia, hypertension, obesity and a sedentary lifestyle.  Hypertension  This is a chronic problem. The current episode started more than 1 year ago. The problem is controlled. Pertinent negatives include no chest pain, headaches, palpitations or shortness of breath. Past treatments include ACE inhibitors.     Review of Systems  Constitutional: Negative for unexpected weight change.  HENT: Negative for trouble swallowing and voice change.   Eyes: Negative for visual disturbance.  Respiratory: Negative for cough, shortness of breath and wheezing.   Cardiovascular: Negative for chest pain, palpitations and leg swelling.  Gastrointestinal: Negative for diarrhea, nausea and vomiting.  Endocrine: Negative for cold intolerance, heat intolerance, polydipsia, polyphagia and polyuria.  Musculoskeletal: Negative for arthralgias and myalgias.  Skin: Negative for color change, pallor, rash and wound.  Neurological: Negative for seizures and headaches.  Psychiatric/Behavioral: Negative for confusion and suicidal ideas.    Objective:    BP 131/81   Pulse 76   Ht 5' (1.524 m)   Wt 211 lb (95.7 kg)   BMI 41.21 kg/m   Wt Readings from Last 3 Encounters:  08/16/16 211 lb (95.7 kg)  08/09/16 212 lb (96.2 kg)  04/24/16 203 lb (92.1 kg)     Physical Exam  Constitutional: She is oriented to person, place, and time. She appears well-developed.  HENT:  Head: Normocephalic and atraumatic.  Eyes: EOM are normal.  Neck: Normal range of motion. Neck supple. No tracheal deviation present. No thyromegaly present.  Cardiovascular: Normal rate and regular rhythm.   Pulmonary/Chest: Effort normal and breath sounds normal.  Abdominal: Soft. Bowel sounds are normal. There is no tenderness. There is no guarding.  Musculoskeletal: Normal range of motion. She exhibits no edema.  Neurological: She is alert and oriented to person, place, and time. She has normal reflexes. No cranial nerve deficit. Coordination normal.  Skin: Skin is warm and dry. No rash noted. No erythema. No pallor.  Psychiatric: She has a normal mood and affect. Judgment normal.    Results for orders placed or performed in visit on [id number]  Basic metabolic panel  Result Value Ref Range   Sodium 135 135 - 146 mmol/L   Potassium 3.7 3.5 - 5.3 mmol/L   Chloride 101 98 - 110 mmol/L   CO2 23 20 - 31 mmol/L   Glucose, Bld  221 (H) 65 - 99 mg/dL   BUN 23 7 - 25 mg/dL   Creat 0.78 0.50 - 1.05 mg/dL   Calcium 9.6 8.6 - 10.4 mg/dL  Hemoglobin A1c  Result Value Ref Range   Hgb A1c MFr Bld 7.2 (H) <5.7 %   Mean Plasma Glucose 160 mg/dL   Diabetic Labs (most recent): Lab Results  Component Value Date   HGBA1C 7.2 (H) 07/26/2016   HGBA1C 7.6 (H) 02/01/2016   HGBA1C 7.3 (A) 10/12/2015   Lipid Panel     Component Value Date/Time   CHOL 175 02/01/2016 0706   TRIG 214 (H) 02/01/2016 0706   HDL 45 (L) 02/01/2016 0706   CHOLHDL 3.9 02/01/2016 0706   VLDL 43 (H) 02/01/2016 0706   LDLCALC 87 02/01/2016 0706     Assessment & Plan:   1. Uncontrolled type 2 diabetes mellitus with complication, with long-term current use of insulin ([place]) -  patient remains at a high risk for more acute and chronic complications of diabetes which include CAD, CVA, CKD, retinopathy, and  neuropathy. These are all discussed in detail with the patient.  Patient came with controlled fasting glucose profile, and  recent A1c of  7.2% improving 7.6 %.  Glucose logs and insulin administration records pertaining to this visit,  to be scanned into patient's records.  Recent labs reviewed.   - I have re-counseled the patient on diet management and weight loss  by adopting a carbohydrate restricted / protein rich  Diet.  - Suggestion is made for patient to avoid simple carbohydrates   from their diet including Cakes , Desserts, Ice Cream,  Soda (  diet and regular) , Sweet Tea , Candies,  Chips, Cookies, Artificial Sweeteners,   and "Sugar-free" Products .  This will help patient to have stable blood glucose profile and potentially avoid unintended  Weight gain.  - Patient is advised to stick to a routine mealtimes to eat 3 meals  a day and avoid unnecessary snacks ( to snack only to correct hypoglycemia).  - I have approached patient with the following individualized plan to manage diabetes and patient agrees.  -She will  increase Lantus to 46 units daily at bedtime, she will not need prandial insulin for now. - I will continue Jardiance to 25 mg po qam. -Continue Trulicity 1.5mg  weekly.   - Patient specific target  for A1c; LDL, HDL, Triglycerides, and  Waist Circumference were discussed in detail.  2) BP/HTN: controlled. Continue current medications including ACEI/ARB. 3) Lipids/HPL:  LDL at 87, continue statins, and gemfibrozil.  Insurance did not cover Lovaza.  4)  Weight/Diet: exercise, and carbohydrates information provided.  5) Chronic Care/Health Maintenance:  -Patient is on ACEI/ARB and Statin medications and encouraged to continue to follow up with Ophthalmology, Podiatrist at least yearly or according to recommendations, and advised to  stay away from smoking. I have recommended yearly flu vaccine and pneumonia vaccination at least every 5 years; moderate intensity  exercise for up to 150 minutes weekly; and  sleep for at least 7 hours a day.  - 25 minutes of time was spent on the care of this patient , 50% of which was applied for counseling on diabetes complications and their preventions.  - I advised patient to maintain close follow up with Mickie Hillier, MD for primary care needs.  Patient is asked to bring meter and  blood glucose logs during their next visit.   Follow up plan: -Return in about 3 months (around  11/15/2016) for follow up with pre-visit labs, meter, and logs.  Glade Lloyd, MD Phone: [phone]  Fax: [phone]   08/16/2016, 4:24 PM

## 2016-08-24 ENCOUNTER — Other Ambulatory Visit: Payer: Self-pay | Admitting: Family Medicine

## 2016-08-26 ENCOUNTER — Other Ambulatory Visit: Payer: Self-pay | Admitting: Family Medicine

## 2016-08-28 ENCOUNTER — Other Ambulatory Visit: Payer: Self-pay | Admitting: "Endocrinology

## 2016-09-12 ENCOUNTER — Other Ambulatory Visit: Payer: Self-pay | Admitting: "Endocrinology

## 2016-09-25 ENCOUNTER — Other Ambulatory Visit: Payer: Self-pay | Admitting: "Endocrinology

## 2016-11-15 ENCOUNTER — Ambulatory Visit: Payer: BLUE CROSS/BLUE SHIELD | Admitting: "Endocrinology

## 2016-11-29 ENCOUNTER — Other Ambulatory Visit: Payer: Self-pay | Admitting: "Endocrinology

## 2016-11-29 DIAGNOSIS — E785 Hyperlipidemia, unspecified: Secondary | ICD-10-CM | POA: Diagnosis not present

## 2016-11-29 DIAGNOSIS — Z794 Long term (current) use of insulin: Secondary | ICD-10-CM | POA: Diagnosis not present

## 2016-11-29 DIAGNOSIS — E1165 Type 2 diabetes mellitus with hyperglycemia: Secondary | ICD-10-CM | POA: Diagnosis not present

## 2016-11-29 DIAGNOSIS — E118 Type 2 diabetes mellitus with unspecified complications: Secondary | ICD-10-CM | POA: Diagnosis not present

## 2016-11-29 LAB — COMPREHENSIVE METABOLIC PANEL
ALT: 21 U/L (ref 6–29)
AST: 16 U/L (ref 10–35)
Albumin: 4 g/dL (ref 3.6–5.1)
Alkaline Phosphatase: 127 U/L (ref 33–130)
BUN: 16 mg/dL (ref 7–25)
CHLORIDE: 105 mmol/L (ref 98–110)
CO2: 27 mmol/L (ref 20–31)
Calcium: 9.3 mg/dL (ref 8.6–10.4)
Creat: 0.62 mg/dL (ref 0.50–1.05)
GLUCOSE: 155 mg/dL — AB (ref 65–99)
POTASSIUM: 3.9 mmol/L (ref 3.5–5.3)
Sodium: 141 mmol/L (ref 135–146)
Total Bilirubin: 0.4 mg/dL (ref 0.2–1.2)
Total Protein: 6.5 g/dL (ref 6.1–8.1)

## 2016-11-29 LAB — LIPID PANEL
CHOL/HDL RATIO: 4.7 ratio (ref <5.0)
CHOLESTEROL: 208 mg/dL — AB (ref <200)
HDL: 44 mg/dL — ABNORMAL LOW (ref >50)
LDL Cholesterol: 121 mg/dL — ABNORMAL HIGH (ref <100)
Triglycerides: 217 mg/dL — ABNORMAL HIGH (ref <150)
VLDL: 43 mg/dL — AB (ref <30)

## 2016-11-29 LAB — HEMOGLOBIN A1C
HEMOGLOBIN A1C: 7.3 % — AB (ref <5.7)
MEAN PLASMA GLUCOSE: 163 mg/dL

## 2016-11-29 LAB — TSH: TSH: 1.54 m[IU]/L

## 2016-11-29 LAB — T4, FREE: FREE T4: 1 ng/dL (ref 0.8–1.8)

## 2016-11-30 DIAGNOSIS — E1140 Type 2 diabetes mellitus with diabetic neuropathy, unspecified: Secondary | ICD-10-CM | POA: Diagnosis not present

## 2016-11-30 DIAGNOSIS — M79673 Pain in unspecified foot: Secondary | ICD-10-CM | POA: Diagnosis not present

## 2016-11-30 DIAGNOSIS — L851 Acquired keratosis [keratoderma] palmaris et plantaris: Secondary | ICD-10-CM | POA: Diagnosis not present

## 2016-11-30 LAB — MICROALBUMIN / CREATININE URINE RATIO
Creatinine, Urine: 104 mg/dL (ref 20–320)
MICROALB UR: 0.6 mg/dL
MICROALB/CREAT RATIO: 6 ug/mg{creat} (ref <30)

## 2016-12-05 ENCOUNTER — Ambulatory Visit (INDEPENDENT_AMBULATORY_CARE_PROVIDER_SITE_OTHER): Payer: BLUE CROSS/BLUE SHIELD | Admitting: "Endocrinology

## 2016-12-05 ENCOUNTER — Encounter: Payer: Self-pay | Admitting: "Endocrinology

## 2016-12-05 VITALS — BP 124/83 | HR 93 | Ht 60.0 in | Wt 209.0 lb

## 2016-12-05 DIAGNOSIS — Z794 Long term (current) use of insulin: Secondary | ICD-10-CM | POA: Diagnosis not present

## 2016-12-05 DIAGNOSIS — E1165 Type 2 diabetes mellitus with hyperglycemia: Secondary | ICD-10-CM

## 2016-12-05 DIAGNOSIS — IMO0002 Reserved for concepts with insufficient information to code with codable children: Secondary | ICD-10-CM

## 2016-12-05 DIAGNOSIS — E118 Type 2 diabetes mellitus with unspecified complications: Secondary | ICD-10-CM | POA: Diagnosis not present

## 2016-12-05 DIAGNOSIS — E782 Mixed hyperlipidemia: Secondary | ICD-10-CM

## 2016-12-05 DIAGNOSIS — I10 Essential (primary) hypertension: Secondary | ICD-10-CM | POA: Diagnosis not present

## 2016-12-05 MED ORDER — ROSUVASTATIN CALCIUM 5 MG PO TABS: 5.0000 mg | ORAL_TABLET | Freq: Every day | ORAL | 2 refills | Status: DC

## 2016-12-05 MED ORDER — DULAGLUTIDE 1.5 MG/0.5ML ~~LOC~~ SOAJ: SUBCUTANEOUS | 2 refills | Status: DC

## 2016-12-05 NOTE — Progress Notes (Signed)
Subjective:    Patient ID: Lisa Burns, female    DOB: [birth year], PCP Mickie Hillier, MD   Past Medical History:  Diagnosis Date  . Allergy   . Asthma   . Diabetes mellitus with neuropathy ([place])   . Diabetes mellitus without complication ([place])   . GERD (gastroesophageal reflux disease)   . Hypertension   . Microproteinuria   . Reflux   . Venous stasis    Past Surgical History:  Procedure Laterality Date  . CESAREAN SECTION    . COLONOSCOPY N/A 04/24/2016   Procedure: COLONOSCOPY;  Surgeon: Danie Binder, MD;  [facility];  Service: Endoscopy;  Laterality: N/A;  8:30 Am  . LAPAROSCOPIC NISSEN FUNDOPLICATION    . OTHER SURGICAL HISTORY     gerd surgery  . THROAT SURGERY  2000   Social History   Social History  . Marital status: Married    Spouse name: N/A  . Number of children: 1  . Years of education: HS   Occupational History  . [employer]     Social History Main Topics  . Smoking status: Former Smoker    Packs/day: 1.00    Years: 5.00    Types: Cigarettes  . Smokeless tobacco: Never Used  . Alcohol use 0.0 oz/week     Comment: occasionally  . Drug use: No  . Sexual activity: Not Asked   Other Topics Concern  . None   Social History Narrative   Drinks 2 cans of soda a day    Outpatient Encounter Prescriptions as of 12/05/2016  Medication Sig  . Dulaglutide (TRULICITY) 1.5 0000000 SOPN INJECT 1.5MG  SUBCUTANEOUSLY ONCE WEEKLY.  . enalapril (VASOTEC) 10 MG tablet TAKE ONE TABLET DAILY AT BEDTIME  . fluticasone (FLONASE) 50 MCG/ACT nasal spray Place 1 spray into both nostrils daily.  . fluticasone (FLOVENT HFA) 110 MCG/ACT inhaler Inhale 2 puffs into the lungs 2 (two) times daily.  . furosemide (LASIX) 40 MG tablet TAKE ONE TABLET EVERY MORNING  . gabapentin (NEURONTIN) 400 MG capsule TAKE ONE (1) CAPSULE THREE (3) TIMES EACH DAY  . glucose blood test strip Use as instructed  . JARDIANCE 25 MG TABS tablet TAKE ONE TABLET EVERY MORNING   . lansoprazole (PREVACID) 15 MG capsule Take 1 capsule (15 mg total) by mouth daily.  Marland Kitchen LANTUS SOLOSTAR 100 UNIT/ML Solostar Pen INJECT UP TO 50 UNITS DAILY AT 10PM  . loratadine (CLARITIN) 10 MG tablet Take 10 mg by mouth. Take two everyday  . nortriptyline (PAMELOR) 50 MG capsule Take 2 capsules po qhs  . rosuvastatin (CRESTOR) 5 MG tablet Take 1 tablet (5 mg total) by mouth daily.  Marland Kitchen triamcinolone cream (KENALOG) 0.1 % Apply 1 application topically 2 (two) times daily.  . valACYclovir (VALTREX) 500 MG tablet Take 500 mg by mouth daily as needed (cold sore flare ups).   . VENTOLIN HFA 108 (90 Base) MCG/ACT inhaler USE TWO SPRAYS EVERY FOUR HOURS AS NEEDED FOR WHEEZING  . [DISCONTINUED] TRULICITY 1.5 0000000 SOPN INJECT 1.5MG  SUBCUTANEOUSLY ONCE WEEKLY.   No facility-administered encounter medications on file as of 12/05/2016.    ALLERGIES: Allergies  Allergen Reactions  . Augmentin [Amoxicillin-Pot Clavulanate]     GI upset  . Januvia [Sitagliptin]   . Metformin And Related     GI upset   VACCINATION STATUS: Immunization History  Administered Date(s) Administered  . Influenza, Seasonal, Injecte, Preservative Fre 08/09/2016  . Pneumococcal Polysaccharide-23 12/30/2013    Diabetes  She  presents for her follow-up diabetic visit. She has type 2 diabetes mellitus. Onset time: She was diagnosed at approximate age of 79 years. Her disease course has been worsening. There are no hypoglycemic associated symptoms. Pertinent negatives for hypoglycemia include no confusion, headaches, pallor or seizures. There are no diabetic associated symptoms. Pertinent negatives for diabetes include no chest pain, no polydipsia, no polyphagia and no polyuria. There are no hypoglycemic complications. Symptoms are worsening. There are no diabetic complications. Risk factors for coronary artery disease include diabetes mellitus, dyslipidemia, family history, hypertension, obesity, sedentary lifestyle and  tobacco exposure. Current diabetic treatment includes insulin injections and oral agent (monotherapy). She is compliant with treatment most of the time. Her weight is stable. She is following a generally unhealthy diet. When asked about meal planning, she reported none. She has not had a previous visit with a dietitian. She never participates in exercise. There is no change in her home blood glucose trend. Her breakfast blood glucose range is generally 180-200 mg/dl. An ACE inhibitor/angiotensin II receptor blocker is being taken. Eye exam is current.  Hyperlipidemia  This is a chronic problem. The current episode started more than 1 year ago. Pertinent negatives include no chest pain, myalgias or shortness of breath. Current antihyperlipidemic treatment includes statins. Risk factors for coronary artery disease include diabetes mellitus, dyslipidemia, hypertension, obesity and a sedentary lifestyle.  Hypertension  This is a chronic problem. The current episode started more than 1 year ago. The problem is controlled. Pertinent negatives include no chest pain, headaches, palpitations or shortness of breath. Past treatments include ACE inhibitors.     Review of Systems  Constitutional: Negative for unexpected weight change.  HENT: Negative for trouble swallowing and voice change.   Eyes: Negative for visual disturbance.  Respiratory: Negative for cough, shortness of breath and wheezing.   Cardiovascular: Negative for chest pain, palpitations and leg swelling.  Gastrointestinal: Negative for diarrhea, nausea and vomiting.  Endocrine: Negative for cold intolerance, heat intolerance, polydipsia, polyphagia and polyuria.  Musculoskeletal: Negative for arthralgias and myalgias.  Skin: Negative for color change, pallor, rash and wound.  Neurological: Negative for seizures and headaches.  Psychiatric/Behavioral: Negative for confusion and suicidal ideas.    Objective:    BP 124/83   Pulse 93   Ht 5'  (1.524 m)   Wt 209 lb (94.8 kg)   BMI 40.82 kg/m   Wt Readings from Last 3 Encounters:  12/05/16 209 lb (94.8 kg)  08/16/16 211 lb (95.7 kg)  08/09/16 212 lb (96.2 kg)    Physical Exam  Constitutional: She is oriented to person, place, and time. She appears well-developed.  HENT:  Head: Normocephalic and atraumatic.  Eyes: EOM are normal.  Neck: Normal range of motion. Neck supple. No tracheal deviation present. No thyromegaly present.  Cardiovascular: Normal rate and regular rhythm.   Pulmonary/Chest: Effort normal and breath sounds normal.  Abdominal: Soft. Bowel sounds are normal. There is no tenderness. There is no guarding.  Musculoskeletal: Normal range of motion. She exhibits no edema.  Neurological: She is alert and oriented to person, place, and time. She has normal reflexes. No cranial nerve deficit. Coordination normal.  Skin: Skin is warm and dry. No rash noted. No erythema. No pallor.  Psychiatric: She has a normal mood and affect. Judgment normal.    Results for orders placed or performed in visit on 11/29/16  Microalbumin / creatinine urine ratio  Result Value Ref Range   Creatinine, Urine 104 20 - 320 mg/dL  Microalb, Ur 0.6 Not estab mg/dL   Microalb Creat Ratio 6 <30 mcg/mg creat  Comprehensive metabolic panel  Result Value Ref Range   Sodium 141 135 - 146 mmol/L   Potassium 3.9 3.5 - 5.3 mmol/L   Chloride 105 98 - 110 mmol/L   CO2 27 20 - 31 mmol/L   Glucose, Bld 155 (H) 65 - 99 mg/dL   BUN 16 7 - 25 mg/dL   Creat 0.62 0.50 - 1.05 mg/dL   Total Bilirubin 0.4 0.2 - 1.2 mg/dL   Alkaline Phosphatase 127 33 - 130 U/L   AST 16 10 - 35 U/L   ALT 21 6 - 29 U/L   Total Protein 6.5 6.1 - 8.1 g/dL   Albumin 4.0 3.6 - 5.1 g/dL   Calcium 9.3 8.6 - 10.4 mg/dL  Lipid panel  Result Value Ref Range   Cholesterol 208 (H) <200 mg/dL   Triglycerides 217 (H) <150 mg/dL   HDL 44 (L) >50 mg/dL   Total CHOL/HDL Ratio 4.7 <5.0 Ratio   VLDL 43 (H) <30 mg/dL   LDL  Cholesterol 121 (H) <100 mg/dL  TSH  Result Value Ref Range   TSH 1.54 mIU/L  T4, free  Result Value Ref Range   Free T4 1.0 0.8 - 1.8 ng/dL  Hemoglobin A1c  Result Value Ref Range   Hgb A1c MFr Bld 7.3 (H) <5.7 %   Mean Plasma Glucose 163 mg/dL   Diabetic Labs (most recent): Lab Results  Component Value Date   HGBA1C 7.3 (H) 11/29/2016   HGBA1C 7.2 (H) 07/26/2016   HGBA1C 7.6 (H) 02/01/2016   Lipid Panel     Component Value Date/Time   CHOL 208 (H) 11/29/2016 0729   TRIG 217 (H) 11/29/2016 0729   HDL 44 (L) 11/29/2016 0729   CHOLHDL 4.7 11/29/2016 0729   VLDL 43 (H) 11/29/2016 0729   LDLCALC 121 (H) 11/29/2016 0729     Assessment & Plan:   1. Uncontrolled type 2 diabetes mellitus with complication, with long-term current use of insulin ([place]) -  patient remains at a high risk for more acute and chronic complications of diabetes which include CAD, CVA, CKD, retinopathy, and neuropathy. These are all discussed in detail with the patient.  Patient came with controlled fasting glucose profile, and  recent A1c of  7.3%, generally improving 7.6 %.  Glucose logs and insulin administration records pertaining to this visit,  to be scanned into patient's records.  Recent labs reviewed.   - I have re-counseled the patient on diet management and weight loss  by adopting a carbohydrate restricted / protein rich  Diet.  - Suggestion is made for patient to avoid simple carbohydrates   from their diet including Cakes , Desserts, Ice Cream,  Soda (  diet and regular) , Sweet Tea , Candies,  Chips, Cookies, Artificial Sweeteners,   and "Sugar-free" Products .  This will help patient to have stable blood glucose profile and potentially avoid unintended  Weight gain.  - Patient is advised to stick to a routine mealtimes to eat 3 meals  a day and avoid unnecessary snacks ( to snack only to correct hypoglycemia).  - I have approached patient with the following individualized plan to manage  diabetes and patient agrees.  -She will  increase Lantus to 50 units daily at bedtime, she will not need prandial insulin for now. - I will continue Jardiance to 25 mg po qam. -Continue Trulicity 1.5mg  weekly.   -  Patient specific target  for A1c; LDL, HDL, Triglycerides, and  Waist Circumference were discussed in detail.  2) BP/HTN: controlled. Continue current medications including ACEI/ARB. 3) Lipids/HPL:  LDL back at 121 from 87, she is willing to retry Crestor 5 mg po qhs, and gemfibrozil.  Insurance did not cover Lovaza.  4)  Weight/Diet: exercise, and carbohydrates information provided.  5) Chronic Care/Health Maintenance:  -Patient is on ACEI/ARB and Statin medications and encouraged to continue to follow up with Ophthalmology, Podiatrist at least yearly or according to recommendations, and advised to  stay away from smoking. I have recommended yearly flu vaccine and pneumonia vaccination at least every 5 years; moderate intensity exercise for up to 150 minutes weekly; and  sleep for at least 7 hours a day.  - 25 minutes of time was spent on the care of this patient , 50% of which was applied for counseling on diabetes complications and their preventions.  - I advised patient to maintain close follow up with Mickie Hillier, MD for primary care needs.  Patient is asked to bring meter and  blood glucose logs during their next visit.   Follow up plan: -Return in about 3 months (around 03/05/2017) for follow up with pre-visit labs, meter, and logs.  Glade Lloyd, MD Phone: [phone]  Fax: [phone]   12/05/2016, 3:16 PM

## 2016-12-05 NOTE — Patient Instructions (Signed)
Advice for weight management -For most of us the best way to lose weight is by diet management. Generally speaking, diet management means restricting carbohydrate consumption to minimum possible (and to unprocessed or minimally processed complex starch) and increasing protein intake (animal or plant source), fruits, and vegetables.  -Sticking to a routine mealtime to eat 3 meals a day and avoiding unnecessary snacks is shown to have a big role in weight control.  -It is better to avoid simple carbohydrates including: Cakes, Desserts, Ice Cream, Soda (diet and regular), Sweet Tea, Candies, Chips, Cookies, Artificial Sweeteners, and "Sugar-free" Products.   -Exercise: 30 minutes a day 3-4 days a week, or 150 minutes a week. Combine stretch, strength, and aerobic activities. You may seek evaluation by your heart doctor prior to initiating exercise if you have high risk for heart disease.  -If you are interested, we can schedule a visit with Lisa Burns, RDN, CDE for individualized nutrition education.  

## 2016-12-13 ENCOUNTER — Telehealth: Payer: Self-pay | Admitting: "Endocrinology

## 2016-12-13 MED ORDER — INSULIN GLARGINE 100 UNIT/ML SOLOSTAR PEN: PEN_INJECTOR | SUBCUTANEOUS | 2 refills | Status: DC

## 2016-12-13 NOTE — Telephone Encounter (Signed)
Prescription sent

## 2016-12-13 NOTE — Telephone Encounter (Signed)
Patient went to pick up a new prescription at [facility] and they did not have anything from Lisa Burns. Pt states Dr.Nida changed her dosage on the Lantus.

## 2016-12-25 ENCOUNTER — Other Ambulatory Visit: Payer: Self-pay | Admitting: "Endocrinology

## 2017-01-22 ENCOUNTER — Other Ambulatory Visit: Payer: Self-pay | Admitting: Family Medicine

## 2017-01-23 NOTE — Telephone Encounter (Signed)
Patient last seen 08/09/16 for a medication check. May we refill

## 2017-02-19 ENCOUNTER — Other Ambulatory Visit: Payer: Self-pay | Admitting: Family Medicine

## 2017-03-02 ENCOUNTER — Other Ambulatory Visit: Payer: Self-pay | Admitting: "Endocrinology

## 2017-03-07 ENCOUNTER — Ambulatory Visit: Payer: BLUE CROSS/BLUE SHIELD | Admitting: "Endocrinology

## 2017-03-17 ENCOUNTER — Other Ambulatory Visit: Payer: Self-pay | Admitting: "Endocrinology

## 2017-03-22 ENCOUNTER — Other Ambulatory Visit: Payer: Self-pay | Admitting: Family Medicine

## 2017-03-28 ENCOUNTER — Other Ambulatory Visit: Payer: Self-pay | Admitting: "Endocrinology

## 2017-03-30 ENCOUNTER — Other Ambulatory Visit: Payer: Self-pay | Admitting: "Endocrinology

## 2017-03-30 DIAGNOSIS — E1165 Type 2 diabetes mellitus with hyperglycemia: Secondary | ICD-10-CM | POA: Diagnosis not present

## 2017-03-30 DIAGNOSIS — Z794 Long term (current) use of insulin: Secondary | ICD-10-CM | POA: Diagnosis not present

## 2017-03-30 DIAGNOSIS — E118 Type 2 diabetes mellitus with unspecified complications: Secondary | ICD-10-CM | POA: Diagnosis not present

## 2017-03-30 LAB — COMPREHENSIVE METABOLIC PANEL
ALBUMIN: 4 g/dL (ref 3.6–5.1)
ALT: 23 U/L (ref 6–29)
AST: 18 U/L (ref 10–35)
Alkaline Phosphatase: 116 U/L (ref 33–130)
BUN: 13 mg/dL (ref 7–25)
CHLORIDE: 105 mmol/L (ref 98–110)
CO2: 24 mmol/L (ref 20–31)
CREATININE: 0.58 mg/dL (ref 0.50–1.05)
Calcium: 9.1 mg/dL (ref 8.6–10.4)
Glucose, Bld: 127 mg/dL — ABNORMAL HIGH (ref 65–99)
POTASSIUM: 4.1 mmol/L (ref 3.5–5.3)
Sodium: 140 mmol/L (ref 135–146)
Total Bilirubin: 0.3 mg/dL (ref 0.2–1.2)
Total Protein: 6.2 g/dL (ref 6.1–8.1)

## 2017-03-31 LAB — HEMOGLOBIN A1C
Hgb A1c MFr Bld: 7.3 % — ABNORMAL HIGH (ref <5.7)
MEAN PLASMA GLUCOSE: 163 mg/dL

## 2017-05-02 ENCOUNTER — Other Ambulatory Visit: Payer: Self-pay | Admitting: Family Medicine

## 2017-05-03 NOTE — Telephone Encounter (Signed)
Last seen 08/09/2016

## 2017-05-03 NOTE — Telephone Encounter (Signed)
30 d only needs six mo follow up three mo ago

## 2017-05-28 ENCOUNTER — Other Ambulatory Visit: Payer: Self-pay | Admitting: Family Medicine

## 2017-06-11 ENCOUNTER — Ambulatory Visit (INDEPENDENT_AMBULATORY_CARE_PROVIDER_SITE_OTHER): Payer: BLUE CROSS/BLUE SHIELD | Admitting: "Endocrinology

## 2017-06-11 ENCOUNTER — Encounter: Payer: Self-pay | Admitting: "Endocrinology

## 2017-06-11 VITALS — BP 108/72 | HR 96 | Wt 211.0 lb

## 2017-06-11 DIAGNOSIS — E782 Mixed hyperlipidemia: Secondary | ICD-10-CM

## 2017-06-11 DIAGNOSIS — E1165 Type 2 diabetes mellitus with hyperglycemia: Secondary | ICD-10-CM | POA: Diagnosis not present

## 2017-06-11 DIAGNOSIS — Z794 Long term (current) use of insulin: Secondary | ICD-10-CM

## 2017-06-11 DIAGNOSIS — IMO0002 Reserved for concepts with insufficient information to code with codable children: Secondary | ICD-10-CM

## 2017-06-11 DIAGNOSIS — E118 Type 2 diabetes mellitus with unspecified complications: Secondary | ICD-10-CM | POA: Diagnosis not present

## 2017-06-11 DIAGNOSIS — I10 Essential (primary) hypertension: Secondary | ICD-10-CM

## 2017-06-11 MED ORDER — DULAGLUTIDE 1.5 MG/0.5ML ~~LOC~~ SOAJ: SUBCUTANEOUS | 2 refills | Status: DC

## 2017-06-11 MED ORDER — PHENTERMINE-TOPIRAMATE ER 3.75-23 MG PO CP24: 1.0000 | ORAL_CAPSULE | Freq: Every day | ORAL | 0 refills | Status: DC

## 2017-06-11 MED ORDER — PHENTERMINE-TOPIRAMATE 7.5-46 MG PO CP24
1.0000 | ORAL_CAPSULE | Freq: Every day | ORAL | 2 refills | Status: DC
Start: 2017-06-11 — End: 2017-12-27

## 2017-06-11 MED ORDER — INSULIN GLARGINE 100 UNIT/ML SOLOSTAR PEN: PEN_INJECTOR | SUBCUTANEOUS | 2 refills | Status: DC

## 2017-06-11 NOTE — Patient Instructions (Signed)

## 2017-06-11 NOTE — Progress Notes (Signed)
Subjective:    Patient ID: Lisa Burns, female    DOB: July 18, 1961, PCP Mikey Kirschner, MD   Past Medical History:  Diagnosis Date  . Allergy   . Asthma   . Diabetes mellitus with neuropathy ([place])   . Diabetes mellitus without complication ([place])   . GERD (gastroesophageal reflux disease)   . Hypertension   . Microproteinuria   . Reflux   . Venous stasis    Past Surgical History:  Procedure Laterality Date  . CESAREAN SECTION    . COLONOSCOPY N/A 04/24/2016   Procedure: COLONOSCOPY;  Surgeon: Danie Binder, MD;  Location: [facility];  Service: Endoscopy;  Laterality: N/A;  8:30 Am  . LAPAROSCOPIC NISSEN FUNDOPLICATION    . OTHER SURGICAL HISTORY     gerd surgery  . THROAT SURGERY  2000   Social History   Social History  . Marital status: Married    Spouse name: N/A  . Number of children: 1  . Years of education: HS   Occupational History  . [employer]     Social History Main Topics  . Smoking status: Former Smoker    Packs/day: 1.00    Years: 5.00    Types: Cigarettes  . Smokeless tobacco: Never Used  . Alcohol use 0.0 oz/week     Comment: occasionally  . Drug use: No  . Sexual activity: Not on file   Other Topics Concern  . Not on file   Social History Narrative   Drinks 2 cans of soda a day    Outpatient Encounter Prescriptions as of 06/11/2017  Medication Sig  . Dulaglutide (TRULICITY) 1.5 BJ/4.7WG SOPN INJECT 1.5MG  SUBCUTANEOUSLY ONCE WEEKLY  . enalapril (VASOTEC) 10 MG tablet TAKE ONE (1) TABLET AT BEDTIME  . FLOVENT HFA 110 MCG/ACT inhaler INHALE 2 PUFFS IN LUNGS TWICE DAILY  . fluticasone (FLONASE) 50 MCG/ACT nasal spray Place 1 spray into both nostrils daily.  . furosemide (LASIX) 40 MG tablet TAKE ONE TABLET EVERY MORNING  . gabapentin (NEURONTIN) 400 MG capsule TAKE ONE (1) CAPSULE THREE (3) TIMES EACH DAY  . glucose blood test strip Use as instructed  . Insulin Glargine (LANTUS SOLOSTAR) 100 UNIT/ML Solostar Pen INJECT UP TO 60  UNITS DAILY AT 10:00PM  . JARDIANCE 25 MG TABS tablet TAKE ONE TABLET EVERY MORNING  . lansoprazole (PREVACID) 15 MG capsule Take 1 capsule (15 mg total) by mouth daily.  Marland Kitchen loratadine (CLARITIN) 10 MG tablet Take 10 mg by mouth. Take two everyday  . nortriptyline (PAMELOR) 50 MG capsule TAKE TWO CAPSULES BY MOUTH AT BEDTIME DAILY  . Phentermine-Topiramate (QSYMIA) 7.5-46 MG CP24 Take 1 capsule by mouth daily.  . Phentermine-Topiramate 3.75-23 MG CP24 Take 1 capsule by mouth daily.  . rosuvastatin (CRESTOR) 5 MG tablet Take 1 tablet (5 mg total) by mouth daily.  Marland Kitchen triamcinolone cream (KENALOG) 0.1 % Apply 1 application topically 2 (two) times daily.  . valACYclovir (VALTREX) 500 MG tablet Take 500 mg by mouth daily as needed (cold sore flare ups).   . VENTOLIN HFA 108 (90 Base) MCG/ACT inhaler USE TWO SPRAYS EVERY FOUR HOURS AS NEEDED FOR WHEEZING  . [DISCONTINUED] LANTUS SOLOSTAR 100 UNIT/ML Solostar Pen INJECT UP TO 50 UNITS DAILY AT 10:00PM  . [DISCONTINUED] TRULICITY 1.5 NF/6.2ZH SOPN INJECT 1.5MG  SUBCUTANEOUSLY ONCE WEEKLY   No facility-administered encounter medications on file as of 06/11/2017.    ALLERGIES: Allergies  Allergen Reactions  . Augmentin [Amoxicillin-Pot Clavulanate]  GI upset  . Januvia [Sitagliptin]   . Metformin And Related     GI upset   VACCINATION STATUS: Immunization History  Administered Date(s) Administered  . Influenza, Seasonal, Injecte, Preservative Fre 08/09/2016  . Pneumococcal Polysaccharide-23 12/30/2013    Diabetes  She presents for her follow-up diabetic visit. She has type 2 diabetes mellitus. Onset time: She was diagnosed at approximate age of 68 years. Her disease course has been stable. There are no hypoglycemic associated symptoms. Pertinent negatives for hypoglycemia include no confusion, headaches, pallor or seizures. There are no diabetic associated symptoms. Pertinent negatives for diabetes include no chest pain, no polydipsia, no  polyphagia and no polyuria. There are no hypoglycemic complications. Symptoms are stable. There are no diabetic complications. Risk factors for coronary artery disease include diabetes mellitus, dyslipidemia, family history, hypertension, obesity, sedentary lifestyle and tobacco exposure. Current diabetic treatment includes insulin injections and oral agent (monotherapy). She is compliant with treatment most of the time. Her weight is stable. She is following a generally unhealthy diet. When asked about meal planning, she reported none. She has not had a previous visit with a dietitian. She never participates in exercise. There is no change in her home blood glucose trend. Her breakfast blood glucose range is generally 180-200 mg/dl. An ACE inhibitor/angiotensin II receptor blocker is being taken. Eye exam is current.  Hyperlipidemia  This is a chronic problem. The current episode started more than 1 year ago. Pertinent negatives include no chest pain, myalgias or shortness of breath. Current antihyperlipidemic treatment includes statins. Risk factors for coronary artery disease include diabetes mellitus, dyslipidemia, hypertension, obesity and a sedentary lifestyle.  Hypertension  This is a chronic problem. The current episode started more than 1 year ago. The problem is controlled. Pertinent negatives include no chest pain, headaches, palpitations or shortness of breath. Past treatments include ACE inhibitors.     Review of Systems  Constitutional: Negative for unexpected weight change.  HENT: Negative for trouble swallowing and voice change.   Eyes: Negative for visual disturbance.  Respiratory: Negative for cough, shortness of breath and wheezing.   Cardiovascular: Negative for chest pain, palpitations and leg swelling.  Gastrointestinal: Negative for diarrhea, nausea and vomiting.  Endocrine: Negative for cold intolerance, heat intolerance, polydipsia, polyphagia and polyuria.  Musculoskeletal:  Negative for arthralgias and myalgias.  Skin: Negative for color change, pallor, rash and wound.  Neurological: Negative for seizures and headaches.  Psychiatric/Behavioral: Negative for confusion and suicidal ideas.    Objective:    BP 108/72   Pulse 96   Wt 211 lb (95.7 kg)   BMI 41.21 kg/m   Wt Readings from Last 3 Encounters:  06/11/17 211 lb (95.7 kg)  12/05/16 209 lb (94.8 kg)  08/16/16 211 lb (95.7 kg)    Physical Exam  Constitutional: She is oriented to person, place, and time. She appears well-developed.  HENT:  Head: Normocephalic and atraumatic.  Eyes: EOM are normal.  Neck: Normal range of motion. Neck supple. No tracheal deviation present. No thyromegaly present.  Cardiovascular: Normal rate and regular rhythm.   Pulmonary/Chest: Effort normal and breath sounds normal.  Abdominal: Soft. Bowel sounds are normal. There is no tenderness. There is no guarding.  Musculoskeletal: Normal range of motion. She exhibits no edema.  Neurological: She is alert and oriented to person, place, and time. She has normal reflexes. No cranial nerve deficit. Coordination normal.  Skin: Skin is warm and dry. No rash noted. No erythema. No pallor.  Psychiatric: She has  a normal mood and affect. Judgment normal.    Results for orders placed or performed in visit on 03/30/17  Comprehensive metabolic panel  Result Value Ref Range   Sodium 140 135 - 146 mmol/L   Potassium 4.1 3.5 - 5.3 mmol/L   Chloride 105 98 - 110 mmol/L   CO2 24 20 - 31 mmol/L   Glucose, Bld 127 (H) 65 - 99 mg/dL   BUN 13 7 - 25 mg/dL   Creat 0.58 0.50 - 1.05 mg/dL   Total Bilirubin 0.3 0.2 - 1.2 mg/dL   Alkaline Phosphatase 116 33 - 130 U/L   AST 18 10 - 35 U/L   ALT 23 6 - 29 U/L   Total Protein 6.2 6.1 - 8.1 g/dL   Albumin 4.0 3.6 - 5.1 g/dL   Calcium 9.1 8.6 - 10.4 mg/dL  Hemoglobin A1c  Result Value Ref Range   Hgb A1c MFr Bld 7.3 (H) <5.7 %   Mean Plasma Glucose 163 mg/dL   Diabetic Labs (most  recent): Lab Results  Component Value Date   HGBA1C 7.3 (H) 03/30/2017   HGBA1C 7.3 (H) 11/29/2016   HGBA1C 7.2 (H) 07/26/2016   Lipid Panel     Component Value Date/Time   CHOL 208 (H) 11/29/2016 0729   TRIG 217 (H) 11/29/2016 0729   HDL 44 (L) 11/29/2016 0729   CHOLHDL 4.7 11/29/2016 0729   VLDL 43 (H) 11/29/2016 0729   LDLCALC 121 (H) 11/29/2016 0729     Assessment & Plan:   1. Uncontrolled type 2 diabetes mellitus with complication, with long-term current use of insulin ([place]) -  patient remains at a high risk for more acute and chronic complications of diabetes which include CAD, CVA, CKD, retinopathy, and neuropathy. These are all discussed in detail with the patient.  Patient came with  Above target fasting glucose profile, and  recent A1c of  7.3%, generally improving 7.6 %.  Glucose logs and insulin administration records pertaining to this visit,  to be scanned into patient's records.  Recent labs reviewed.   - I have re-counseled the patient on diet management and weight loss  by adopting a carbohydrate restricted / protein rich  Diet.  - Suggestion is made for patient to avoid simple carbohydrates   from their diet including Cakes , Desserts, Ice Cream,  Soda (  diet and regular) , Sweet Tea , Candies,  Chips, Cookies, Artificial Sweeteners,   and "Sugar-free" Products .  This will help patient to have stable blood glucose profile and potentially avoid unintended  Weight gain.  - Patient is advised to stick to a routine mealtimes to eat 3 meals  a day and avoid unnecessary snacks ( to snack only to correct hypoglycemia).  - I have approached patient with the following individualized plan to manage diabetes and patient agrees.  -She will  increase Lantus to 60 units daily at bedtime, she will not need prandial insulin for now. - I will continue Jardiance to 25 mg po qam. -Continue Trulicity 1.5mg  weekly.   - Patient specific target  for A1c; LDL, HDL, Triglycerides,  and  Waist Circumference were discussed in detail.  2) BP/HTN: controlled. Continue current medications including ACEI/ARB. 3) Lipids/HPL:  LDL back at 121 from 87, she is willing to retry Crestor 5 mg po qhs, and gemfibrozil.  Insurance did not cover Lovaza.  4)  Weight/Diet: exercise, and carbohydrates information provided. She will benefit from one of the recently approved weight loss medications.  I discussed and initiated Qsymia for her. She is not ready for bariatric Surgery.  5) Chronic Care/Health Maintenance:  -Patient is on ACEI/ARB and Statin medications and encouraged to continue to follow up with Ophthalmology, Podiatrist at least yearly or according to recommendations, and advised to  stay away from smoking. I have recommended yearly flu vaccine and pneumonia vaccination at least every 5 years; moderate intensity exercise for up to 150 minutes weekly; and  sleep for at least 7 hours a day.  - 25 minutes of time was spent on the care of this patient , 50% of which was applied for counseling on diabetes complications and their preventions.  - I advised patient to maintain close follow up with Mikey Kirschner, MD for primary care needs.  Patient is asked to bring meter and  blood glucose logs during her next visit.   Follow up plan: -Return in about 3 months (around 09/11/2017) for meter, and logs, follow up with pre-visit labs, meter, and logs.  Glade Lloyd, MD Phone: [phone]  Fax: [phone]   06/11/2017, 4:30 PM

## 2017-06-13 ENCOUNTER — Other Ambulatory Visit: Payer: Self-pay | Admitting: "Endocrinology

## 2017-06-17 ENCOUNTER — Other Ambulatory Visit: Payer: Self-pay | Admitting: "Endocrinology

## 2017-06-26 ENCOUNTER — Ambulatory Visit (INDEPENDENT_AMBULATORY_CARE_PROVIDER_SITE_OTHER): Payer: BLUE CROSS/BLUE SHIELD | Admitting: Family Medicine

## 2017-06-26 ENCOUNTER — Encounter: Payer: Self-pay | Admitting: Family Medicine

## 2017-06-26 VITALS — BP 110/70 | Ht 60.0 in | Wt 210.5 lb

## 2017-06-26 DIAGNOSIS — J4541 Moderate persistent asthma with (acute) exacerbation: Secondary | ICD-10-CM | POA: Diagnosis not present

## 2017-06-26 DIAGNOSIS — G609 Hereditary and idiopathic neuropathy, unspecified: Secondary | ICD-10-CM

## 2017-06-26 DIAGNOSIS — E782 Mixed hyperlipidemia: Secondary | ICD-10-CM | POA: Diagnosis not present

## 2017-06-26 DIAGNOSIS — I10 Essential (primary) hypertension: Secondary | ICD-10-CM

## 2017-06-26 MED ORDER — FUROSEMIDE 40 MG PO TABS: 40.0000 mg | ORAL_TABLET | Freq: Every morning | ORAL | 1 refills | Status: DC

## 2017-06-26 MED ORDER — NORTRIPTYLINE HCL 50 MG PO CAPS: ORAL_CAPSULE | ORAL | 1 refills | Status: DC

## 2017-06-26 MED ORDER — FLUTICASONE PROPIONATE 50 MCG/ACT NA SUSP: 1.0000 | Freq: Every day | NASAL | 5 refills | Status: DC

## 2017-06-26 MED ORDER — ALBUTEROL SULFATE HFA 108 (90 BASE) MCG/ACT IN AERS: 2.0000 | INHALATION_SPRAY | Freq: Four times a day (QID) | RESPIRATORY_TRACT | 5 refills | Status: DC | PRN

## 2017-06-26 MED ORDER — GABAPENTIN 400 MG PO CAPS: ORAL_CAPSULE | ORAL | 5 refills | Status: DC

## 2017-06-26 MED ORDER — ENALAPRIL MALEATE 10 MG PO TABS: ORAL_TABLET | ORAL | 1 refills | Status: DC

## 2017-06-26 MED ORDER — LANSOPRAZOLE 15 MG PO CPDR: 15.0000 mg | DELAYED_RELEASE_CAPSULE | Freq: Every day | ORAL | 5 refills | Status: DC

## 2017-06-26 MED ORDER — PRAVASTATIN SODIUM 20 MG PO TABS: 20.0000 mg | ORAL_TABLET | Freq: Every day | ORAL | 1 refills | Status: DC

## 2017-06-26 NOTE — Progress Notes (Signed)
   Subjective:    Patient ID: Lisa Burns, female    DOB: [birth year], 56 y.o.   MRN: [id number] Patient arrives office with several distinct concerns. Hypertension  This is a chronic problem. The current episode started more than 1 year ago.   Blood pressure medicine and blood pressure levels reviewed today with patient. Compliant with blood pressure medicine. States does not miss a dose. No obvious side effects. Blood pressure generally good when checked elsewhere. Watching salt intake.   Patient continues to take lipid medication until she stopped it because of suspected side effects. Long discussion held in this regard. Prior blood work results are reviewed with patient. 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 Generates pain. Pain is worse in the evening. We have her on a couple of medications for this. Patient now seen diabetes specialist, however they do not check her feet   Asthma remains stable as long she stays on her preventive medication.   Patient states no concerns this visit.  Review of Systems No headache, no major weight loss or weight gain, no chest pain no back pain abdominal pain no change in bowel habits complete ROS otherwise negative     Objective:   Physical Exam Alert and oriented, vitals reviewed and stable, NAD ENT-TM's and ext canals WNL bilat via otoscopic exam Soft palate, tonsils and post pharynx WNL via oropharyngeal exam Neck-symmetric, no masses; thyroid nonpalpable and nontender Pulmonary-no tachypnea or accessory muscle use; Clear without wheezes via auscultation Card--no abnrml murmurs, rhythm reg and rate WNL Carotid pulses symmetric, without bruits C diabetic foot exam       Assessment & Plan:  Impression 1 hypertension decent control discussed maintain same dose of meds compliance discussed  #2 sensory neuropathy. Secondary to the diabetes. Patient requires medication in  order to continue to function these medications are refilled today.  #3 hyperlipidemia. Patient self stopped Crestor based on perceived side effects. Very long discussion held regarding the nature of heart disease risk in folks with diabetes. Patient agrees to another medication will initiate Pravachol. Further recommendations based on response next  #4 asthma clinically stable to continue same treatment

## 2017-07-02 DIAGNOSIS — Z029 Encounter for administrative examinations, unspecified: Secondary | ICD-10-CM

## 2017-07-18 DIAGNOSIS — Z01419 Encounter for gynecological examination (general) (routine) without abnormal findings: Secondary | ICD-10-CM | POA: Diagnosis not present

## 2017-07-18 DIAGNOSIS — Z124 Encounter for screening for malignant neoplasm of cervix: Secondary | ICD-10-CM | POA: Diagnosis not present

## 2017-07-18 DIAGNOSIS — Z6841 Body Mass Index (BMI) 40.0 and over, adult: Secondary | ICD-10-CM | POA: Diagnosis not present

## 2017-07-18 DIAGNOSIS — Z1231 Encounter for screening mammogram for malignant neoplasm of breast: Secondary | ICD-10-CM | POA: Diagnosis not present

## 2017-08-02 DIAGNOSIS — L718 Other rosacea: Secondary | ICD-10-CM | POA: Diagnosis not present

## 2017-08-02 DIAGNOSIS — L218 Other seborrheic dermatitis: Secondary | ICD-10-CM | POA: Diagnosis not present

## 2017-08-22 ENCOUNTER — Encounter: Payer: Self-pay | Admitting: Family Medicine

## 2017-08-22 ENCOUNTER — Ambulatory Visit (INDEPENDENT_AMBULATORY_CARE_PROVIDER_SITE_OTHER): Payer: BLUE CROSS/BLUE SHIELD | Admitting: Family Medicine

## 2017-08-22 VITALS — BP 122/86 | Temp 98.3°F | Ht 60.0 in | Wt 216.4 lb

## 2017-08-22 DIAGNOSIS — J329 Chronic sinusitis, unspecified: Secondary | ICD-10-CM | POA: Diagnosis not present

## 2017-08-22 MED ORDER — HYDROCODONE-HOMATROPINE 5-1.5 MG/5ML PO SYRP: ORAL_SOLUTION | ORAL | 0 refills | Status: DC

## 2017-08-22 MED ORDER — CEFDINIR 300 MG PO CAPS: 300.0000 mg | ORAL_CAPSULE | Freq: Two times a day (BID) | ORAL | 0 refills | Status: DC

## 2017-08-22 NOTE — Progress Notes (Signed)
   Subjective:    Patient ID: Lisa Burns, female    DOB: Jul 19, 1961, 55 y.o.   MRN: [id number]  Cough  This is a new problem. Associated symptoms include headaches, nasal congestion, a sore throat and wheezing. Treatments tried: mucinex.    Cough and cong and drange  Gunky disch productive  Girl at work was sick  Out doors a lot last wk   mucinex   Frontal h a, worse with motion , sharp at times   Review of Systems  HENT: Positive for sore throat.   Respiratory: Positive for cough and wheezing.   Neurological: Positive for headaches.       Objective:   Physical Exam  Alert, mild malaise. Hydration good Vitals stable. frontal/ maxillary tenderness evident positive nasal congestion. pharynx normal neck supple  lungs clear/no crackles or wheezes. heart regular in rhythm       Assessment & Plan:  Impression rhinosinusitis likely post viral, discussed with patient. plan antibiotics prescribed. Questions answered. Symptomatic care discussed. warning signs discussed. WSL

## 2017-08-23 ENCOUNTER — Other Ambulatory Visit: Payer: Self-pay | Admitting: "Endocrinology

## 2017-08-26 ENCOUNTER — Other Ambulatory Visit: Payer: Self-pay | Admitting: "Endocrinology

## 2017-09-12 ENCOUNTER — Ambulatory Visit: Payer: BLUE CROSS/BLUE SHIELD | Admitting: "Endocrinology

## 2017-09-21 ENCOUNTER — Other Ambulatory Visit: Payer: Self-pay | Admitting: "Endocrinology

## 2017-09-24 DIAGNOSIS — H04123 Dry eye syndrome of bilateral lacrimal glands: Secondary | ICD-10-CM | POA: Diagnosis not present

## 2017-09-24 DIAGNOSIS — E119 Type 2 diabetes mellitus without complications: Secondary | ICD-10-CM | POA: Diagnosis not present

## 2017-09-24 DIAGNOSIS — H2513 Age-related nuclear cataract, bilateral: Secondary | ICD-10-CM | POA: Diagnosis not present

## 2017-09-24 DIAGNOSIS — R51 Headache: Secondary | ICD-10-CM | POA: Diagnosis not present

## 2017-09-24 LAB — HM DIABETES EYE EXAM

## 2017-10-09 ENCOUNTER — Ambulatory Visit: Payer: BLUE CROSS/BLUE SHIELD | Admitting: "Endocrinology

## 2017-10-12 ENCOUNTER — Encounter: Payer: Self-pay | Admitting: *Deleted

## 2017-11-08 ENCOUNTER — Other Ambulatory Visit: Payer: Self-pay | Admitting: "Endocrinology

## 2017-11-18 ENCOUNTER — Other Ambulatory Visit: Payer: Self-pay | Admitting: "Endocrinology

## 2017-11-25 ENCOUNTER — Emergency Department (HOSPITAL_COMMUNITY): Payer: BLUE CROSS/BLUE SHIELD

## 2017-11-25 ENCOUNTER — Encounter (HOSPITAL_COMMUNITY): Payer: Self-pay | Admitting: Emergency Medicine

## 2017-11-25 ENCOUNTER — Other Ambulatory Visit: Payer: Self-pay

## 2017-11-25 ENCOUNTER — Emergency Department (HOSPITAL_COMMUNITY)
Admission: EM | Admit: 2017-11-25 | Discharge: 2017-11-25 | Disposition: A | Discharge status: Home / Self Care | Payer: BLUE CROSS/BLUE SHIELD | Attending: Emergency Medicine | Admitting: Emergency Medicine

## 2017-11-25 DIAGNOSIS — R0789 Other chest pain: Secondary | ICD-10-CM | POA: Diagnosis not present

## 2017-11-25 DIAGNOSIS — Z5321 Procedure and treatment not carried out due to patient leaving prior to being seen by health care provider: Secondary | ICD-10-CM | POA: Diagnosis not present

## 2017-11-25 LAB — COMPREHENSIVE METABOLIC PANEL
ALK PHOS: 102 U/L (ref 38–126)
ALT: 23 U/L (ref 14–54)
AST: 17 U/L (ref 15–41)
Albumin: 4.2 g/dL (ref 3.5–5.0)
Anion gap: 11 (ref 5–15)
BILIRUBIN TOTAL: 0.3 mg/dL (ref 0.3–1.2)
BUN: 19 mg/dL (ref 6–20)
CALCIUM: 9.3 mg/dL (ref 8.9–10.3)
CHLORIDE: 104 mmol/L (ref 101–111)
CO2: 27 mmol/L (ref 22–32)
CREATININE: 0.69 mg/dL (ref 0.44–1.00)
GFR calc Af Amer: >60 mL/min (ref >60)
Glucose, Bld: 98 mg/dL (ref 65–99)
Potassium: 3.6 mmol/L (ref 3.5–5.1)
Sodium: 142 mmol/L (ref 135–145)
Total Protein: 7.3 g/dL (ref 6.5–8.1)

## 2017-11-25 LAB — CBC WITH DIFFERENTIAL/PLATELET
BASOS ABS: 0.1 10*3/uL (ref 0.0–0.1)
Basophils Relative: 1 %
Eosinophils Absolute: 0.2 10*3/uL (ref 0.0–0.7)
Eosinophils Relative: 4 %
HEMATOCRIT: 43.7 % (ref 36.0–46.0)
HEMOGLOBIN: 13.8 g/dL (ref 12.0–15.0)
LYMPHS ABS: 1.3 10*3/uL (ref 0.7–4.0)
LYMPHS PCT: 23 %
MCH: 28.3 pg (ref 26.0–34.0)
MCHC: 31.6 g/dL (ref 30.0–36.0)
MCV: 89.5 fL (ref 78.0–100.0)
Monocytes Absolute: 0.5 10*3/uL (ref 0.1–1.0)
Monocytes Relative: 8 %
NEUTROS ABS: 3.6 10*3/uL (ref 1.7–7.7)
NEUTROS PCT: 64 %
PLATELETS: 234 10*3/uL (ref 150–400)
RBC: 4.88 MIL/uL (ref 3.87–5.11)
RDW: 14 % (ref 11.5–15.5)
WBC: 5.6 10*3/uL (ref 4.0–10.5)

## 2017-11-25 LAB — TROPONIN I

## 2017-11-25 NOTE — ED Triage Notes (Signed)
PT c/o left sided chest pain that radiates into her back and arm that started yesterday and came back today. PT denies any nausea or vomiting and states some SOB while walking up her basement stairs today. PT also states he husband is sick with the flu also.

## 2017-12-01 ENCOUNTER — Other Ambulatory Visit: Payer: Self-pay | Admitting: "Endocrinology

## 2017-12-04 DIAGNOSIS — E118 Type 2 diabetes mellitus with unspecified complications: Secondary | ICD-10-CM | POA: Diagnosis not present

## 2017-12-04 DIAGNOSIS — Z794 Long term (current) use of insulin: Secondary | ICD-10-CM | POA: Diagnosis not present

## 2017-12-04 DIAGNOSIS — E1165 Type 2 diabetes mellitus with hyperglycemia: Secondary | ICD-10-CM | POA: Diagnosis not present

## 2017-12-05 LAB — RENAL FUNCTION PANEL
Albumin: 4.1 g/dL (ref 3.6–5.1)
BUN: 19 mg/dL (ref 7–25)
CHLORIDE: 106 mmol/L (ref 98–110)
CO2: 29 mmol/L (ref 20–32)
CREATININE: 0.69 mg/dL (ref 0.50–1.05)
Calcium: 9.7 mg/dL (ref 8.6–10.4)
Glucose, Bld: 104 mg/dL — ABNORMAL HIGH (ref 65–99)
POTASSIUM: 3.8 mmol/L (ref 3.5–5.3)
Phosphorus: 4.2 mg/dL (ref 2.5–4.5)
SODIUM: 143 mmol/L (ref 135–146)

## 2017-12-05 LAB — HEMOGLOBIN A1C
Hgb A1c MFr Bld: 6.9 % of total Hgb — ABNORMAL HIGH (ref <5.7)
MEAN PLASMA GLUCOSE: 151 (calc)
eAG (mmol/L): 8.4 (calc)

## 2017-12-12 ENCOUNTER — Ambulatory Visit (INDEPENDENT_AMBULATORY_CARE_PROVIDER_SITE_OTHER): Payer: BLUE CROSS/BLUE SHIELD | Admitting: "Endocrinology

## 2017-12-12 ENCOUNTER — Encounter: Payer: Self-pay | Admitting: "Endocrinology

## 2017-12-12 VITALS — BP 134/79 | HR 98 | Ht 60.0 in | Wt 212.0 lb

## 2017-12-12 DIAGNOSIS — E1165 Type 2 diabetes mellitus with hyperglycemia: Secondary | ICD-10-CM

## 2017-12-12 DIAGNOSIS — E782 Mixed hyperlipidemia: Secondary | ICD-10-CM | POA: Diagnosis not present

## 2017-12-12 DIAGNOSIS — IMO0002 Reserved for concepts with insufficient information to code with codable children: Secondary | ICD-10-CM

## 2017-12-12 DIAGNOSIS — I10 Essential (primary) hypertension: Secondary | ICD-10-CM

## 2017-12-12 DIAGNOSIS — E118 Type 2 diabetes mellitus with unspecified complications: Secondary | ICD-10-CM

## 2017-12-12 DIAGNOSIS — Z794 Long term (current) use of insulin: Secondary | ICD-10-CM

## 2017-12-12 MED ORDER — INSULIN GLARGINE 100 UNIT/ML SOLOSTAR PEN: 70.0000 [IU] | PEN_INJECTOR | Freq: Every day | SUBCUTANEOUS | 2 refills | Status: DC

## 2017-12-12 MED ORDER — DULAGLUTIDE 1.5 MG/0.5ML ~~LOC~~ SOAJ: SUBCUTANEOUS | 2 refills | Status: DC

## 2017-12-12 NOTE — Progress Notes (Signed)
Subjective:    Patient ID: Lisa Burns, female    DOB: Aug 13, 1961, PCP Mikey Kirschner, MD   Past Medical History:  Diagnosis Date  . Allergy   . Asthma   . Diabetes mellitus with neuropathy ([place])   . Diabetes mellitus without complication ([place])   . GERD (gastroesophageal reflux disease)   . Hypertension   . Microproteinuria   . Reflux   . Venous stasis    Past Surgical History:  Procedure Laterality Date  . CESAREAN SECTION    . COLONOSCOPY N/A 04/24/2016   Procedure: COLONOSCOPY;  Surgeon: Danie Binder, MD;  Location: [facility];  Service: Endoscopy;  Laterality: N/A;  8:30 Am  . LAPAROSCOPIC NISSEN FUNDOPLICATION    . OTHER SURGICAL HISTORY     gerd surgery  . THROAT SURGERY  2000   Social History   Socioeconomic History  . Marital status: Married    Spouse name: Not on file  . Number of children: 1  . Years of education: HS  . Highest education level: Not on file  Social Needs  . Financial resource strain: Not on file  . Food insecurity - worry: Not on file  . Food insecurity - inability: Not on file  . Transportation needs - medical: Not on file  . Transportation needs - non-medical: Not on file  Occupational History  . Occupation: [employer]   Tobacco Use  . Smoking status: Former Smoker    Packs/day: 1.00    Years: 5.00    Pack years: 5.00    Types: Cigarettes  . Smokeless tobacco: Never Used  Substance and Sexual Activity  . Alcohol use: Yes    Alcohol/week: 0.0 oz    Comment: occasionally  . Drug use: No  . Sexual activity: Not on file  Other Topics Concern  . Not on file  Social History Narrative   Drinks 2 cans of soda a day    Outpatient Encounter Medications as of 12/12/2017  Medication Sig  . albuterol (PROVENTIL HFA;VENTOLIN HFA) 108 (90 Base) MCG/ACT inhaler Inhale 2 puffs into the lungs every 6 (six) hours as needed for wheezing or shortness of breath.  . cefdinir (OMNICEF) 300 MG capsule Take 1 capsule (300 mg total) by  mouth 2 (two) times daily.  . Dulaglutide (TRULICITY) 1.5 PP/2.9JJ SOPN INJECT 1.5MG ( ONE PEN) INTO THE SKIN ONCE A WEEK  . enalapril (VASOTEC) 10 MG tablet TAKE ONE (1) TABLET AT BEDTIME  . FLOVENT HFA 110 MCG/ACT inhaler INHALE 2 PUFFS IN LUNGS TWICE DAILY  . fluticasone (FLONASE) 50 MCG/ACT nasal spray Place 1 spray into both nostrils daily.  . furosemide (LASIX) 40 MG tablet Take 1 tablet (40 mg total) by mouth every morning.  . gabapentin (NEURONTIN) 400 MG capsule TAKE ONE (1) CAPSULE THREE (3) TIMES EACH DAY  . glucose blood test strip Use as instructed  . HYDROcodone-homatropine (HYCODAN) 5-1.5 MG/5ML syrup Take 1 teaspoon at bedtime as needed for cough  . Insulin Glargine (LANTUS SOLOSTAR) 100 UNIT/ML Solostar Pen Inject 70 Units into the skin at bedtime.  Marland Kitchen JARDIANCE 25 MG TABS tablet TAKE 1 TABLET BY MOUTH EVERY MORNING  . lansoprazole (PREVACID) 15 MG capsule Take 1 capsule (15 mg total) by mouth daily.  Marland Kitchen LANTUS SOLOSTAR 100 UNIT/ML Solostar Pen ADMINISTER UP TO 60 UNITS UNDER THE SKIN DAILY AT 10 PM  . loratadine (CLARITIN) 10 MG tablet Take 10 mg by mouth. Take two everyday  . nortriptyline (PAMELOR) 50  MG capsule TAKE TWO CAPSULES BY MOUTH AT BEDTIME DAILY  . Phentermine-Topiramate (QSYMIA) 7.5-46 MG CP24 Take 1 capsule by mouth daily.  . Phentermine-Topiramate 3.75-23 MG CP24 Take 1 capsule by mouth daily.  . pravastatin (PRAVACHOL) 20 MG tablet Take 1 tablet (20 mg total) by mouth daily.  Marland Kitchen triamcinolone cream (KENALOG) 0.1 % Apply 1 application topically 2 (two) times daily.  . valACYclovir (VALTREX) 500 MG tablet Take 500 mg by mouth daily as needed (cold sore flare ups).   . [DISCONTINUED] Insulin Glargine (LANTUS SOLOSTAR) 100 UNIT/ML Solostar Pen Inject 60 Units into the skin at bedtime.  . [DISCONTINUED] TRULICITY 1.5 GY/6.9SW SOPN INJECT 1.5MG ( ONE PEN) INTO THE SKIN ONCE A WEEK   No facility-administered encounter medications on file as of 12/12/2017.     ALLERGIES: Allergies  Allergen Reactions  . Augmentin [Amoxicillin-Pot Clavulanate]     GI upset  . Januvia [Sitagliptin]   . Metformin And Related     GI upset   VACCINATION STATUS: Immunization History  Administered Date(s) Administered  . Influenza, Seasonal, Injecte, Preservative Fre 08/09/2016  . Pneumococcal Polysaccharide-23 12/30/2013    Diabetes  She presents for her follow-up diabetic visit. She has type 2 diabetes mellitus. Onset time: She was diagnosed at approximate age of 39 years. Her disease course has been improving. There are no hypoglycemic associated symptoms. Pertinent negatives for hypoglycemia include no confusion, headaches, pallor or seizures. There are no diabetic associated symptoms. Pertinent negatives for diabetes include no chest pain, no polydipsia, no polyphagia and no polyuria. There are no hypoglycemic complications. Symptoms are improving. There are no diabetic complications. Risk factors for coronary artery disease include diabetes mellitus, dyslipidemia, family history, hypertension, obesity, sedentary lifestyle and tobacco exposure. Current diabetic treatment includes insulin injections and oral agent (monotherapy). She is compliant with treatment most of the time. Her weight is stable. She is following a generally unhealthy diet. When asked about meal planning, she reported none. She has not had a previous visit with a dietitian. She never participates in exercise. There is no change in her home blood glucose trend. Her breakfast blood glucose range is generally 140-180 mg/dl. Her overall blood glucose range is 140-180 mg/dl. An ACE inhibitor/angiotensin II receptor blocker is being taken. Eye exam is current.  Hyperlipidemia  This is a chronic problem. The current episode started more than 1 year ago. Exacerbating diseases include diabetes and obesity. Pertinent negatives include no chest pain, myalgias or shortness of breath. Current antihyperlipidemic  treatment includes statins. Risk factors for coronary artery disease include diabetes mellitus, dyslipidemia, hypertension, obesity and a sedentary lifestyle.  Hypertension  This is a chronic problem. The current episode started more than 1 year ago. The problem is controlled. Pertinent negatives include no chest pain, headaches, palpitations or shortness of breath. Risk factors for coronary artery disease include dyslipidemia, diabetes mellitus, obesity and sedentary lifestyle. Past treatments include ACE inhibitors.    Review of Systems  Constitutional: Negative for unexpected weight change.  HENT: Negative for trouble swallowing and voice change.   Eyes: Negative for visual disturbance.  Respiratory: Negative for cough, shortness of breath and wheezing.   Cardiovascular: Negative for chest pain, palpitations and leg swelling.  Gastrointestinal: Negative for diarrhea, nausea and vomiting.  Endocrine: Negative for cold intolerance, heat intolerance, polydipsia, polyphagia and polyuria.  Musculoskeletal: Negative for arthralgias and myalgias.  Skin: Negative for color change, pallor, rash and wound.  Neurological: Negative for seizures and headaches.  Psychiatric/Behavioral: Negative for confusion and suicidal ideas.  Objective:    BP 134/79   Pulse 98   Ht 5' (1.524 m)   Wt 212 lb (96.2 kg)   BMI 41.40 kg/m   Wt Readings from Last 3 Encounters:  12/12/17 212 lb (96.2 kg)  11/25/17 205 lb (93 kg)  08/22/17 216 lb 6.4 oz (98.2 kg)    Physical Exam  Constitutional: She is oriented to person, place, and time. She appears well-developed.  HENT:  Head: Normocephalic and atraumatic.  Eyes: EOM are normal.  Neck: Normal range of motion. Neck supple. No tracheal deviation present. No thyromegaly present.  Cardiovascular: Normal rate and regular rhythm.  Pulmonary/Chest: Effort normal and breath sounds normal.  Abdominal: Soft. Bowel sounds are normal. There is no tenderness. There  is no guarding.  Musculoskeletal: Normal range of motion. She exhibits no edema.  Neurological: She is alert and oriented to person, place, and time. She has normal reflexes. No cranial nerve deficit. Coordination normal.  Skin: Skin is warm and dry. No rash noted. No erythema. No pallor.  Psychiatric: She has a normal mood and affect. Judgment normal.    Results for orders placed or performed during the hospital encounter of 11/25/17  CBC with Differential  Result Value Ref Range   WBC 5.6 4.0 - 10.5 K/uL   RBC 4.88 3.87 - 5.11 MIL/uL   Hemoglobin 13.8 12.0 - 15.0 g/dL   HCT 43.7 36.0 - 46.0 %   MCV 89.5 78.0 - 100.0 fL   MCH 28.3 26.0 - 34.0 pg   MCHC 31.6 30.0 - 36.0 g/dL   RDW 14.0 11.5 - 15.5 %   Platelets 234 150 - 400 K/uL   Neutrophils Relative % 64 %   Neutro Abs 3.6 1.7 - 7.7 K/uL   Lymphocytes Relative 23 %   Lymphs Abs 1.3 0.7 - 4.0 K/uL   Monocytes Relative 8 %   Monocytes Absolute 0.5 0.1 - 1.0 K/uL   Eosinophils Relative 4 %   Eosinophils Absolute 0.2 0.0 - 0.7 K/uL   Basophils Relative 1 %   Basophils Absolute 0.1 0.0 - 0.1 K/uL  Comprehensive metabolic panel  Result Value Ref Range   Sodium 142 135 - 145 mmol/L   Potassium 3.6 3.5 - 5.1 mmol/L   Chloride 104 101 - 111 mmol/L   CO2 27 22 - 32 mmol/L   Glucose, Bld 98 65 - 99 mg/dL   BUN 19 6 - 20 mg/dL   Creatinine, Ser 0.69 0.44 - 1.00 mg/dL   Calcium 9.3 8.9 - 10.3 mg/dL   Total Protein 7.3 6.5 - 8.1 g/dL   Albumin 4.2 3.5 - 5.0 g/dL   AST 17 15 - 41 U/L   ALT 23 14 - 54 U/L   Alkaline Phosphatase 102 38 - 126 U/L   Total Bilirubin 0.3 0.3 - 1.2 mg/dL   GFR calc non Af Amer >60 >60 mL/min   GFR calc Af Amer >60 >60 mL/min   Anion gap 11 5 - 15  Troponin I  Result Value Ref Range   Troponin I <0.03 <0.03 ng/mL   Diabetic Labs (most recent): Lab Results  Component Value Date   HGBA1C 6.9 (H) 12/04/2017   HGBA1C 7.3 (H) 03/30/2017   HGBA1C 7.3 (H) 11/29/2016   Lipid Panel     Component Value  Date/Time   CHOL 208 (H) 11/29/2016 0729   TRIG 217 (H) 11/29/2016 0729   HDL 44 (L) 11/29/2016 0729   CHOLHDL 4.7 11/29/2016 0729  VLDL 43 (H) 11/29/2016 0729   LDLCALC 121 (H) 11/29/2016 0729     Assessment & Plan:   1. Uncontrolled type 2 diabetes mellitus with complication, with long-term current use of insulin ([place]) -  patient remains at a high risk for more acute and chronic complications of diabetes which include CAD, CVA, CKD, retinopathy, and neuropathy. These are all discussed in detail with the patient.  Patient came with  Above target fasting glucose profile, and  recent A1c of  6.9 %, generally improving 10+ %.  Glucose logs and insulin administration records pertaining to this visit,  to be scanned into patient's records.  Recent labs reviewed.   - I have re-counseled the patient on diet management and weight loss  by adopting a carbohydrate restricted / protein rich  Diet.  -  Suggestion is made for her to avoid simple carbohydrates  from her diet including Cakes, Sweet Desserts / Pastries, Ice Cream, Soda (diet and regular), Sweet Tea, Candies, Chips, Cookies, Store Bought Juices, Alcohol in Excess of  1-2 drinks a day, Artificial Sweeteners, and "Sugar-free" Products. This will help patient to have stable blood glucose profile and potentially avoid unintended weight gain.   - Patient is advised to stick to a routine mealtimes to eat 3 meals  a day and avoid unnecessary snacks ( to snack only to correct hypoglycemia).  - I have approached patient with the following individualized plan to manage diabetes and patient agrees.  -She will  increase Lantus to 70 units daily at bedtime, she will not need prandial insulin for now. - I will discontinue Jardiance. -Continue Trulicity 1.5mg  subcutaneously weekly.   - Patient specific target  for A1c; LDL, HDL, Triglycerides, and  Waist Circumference were discussed in detail.  2) BP/HTN: Her blood pressure is controlled to  target.  Continue current medications including ACEI/ARB. 3) Lipids/HPL:  LDL back at 121 from 87, she is willing to retry Crestor 5 mg po qhs, and gemfibrozil.  Insurance did not cover Lovaza.   4)  Weight/Diet: exercise, and carbohydrates information provided. She will benefit from one of the recently approved weight loss medications. I discussed and initiated Qsymia for her. She is not ready for bariatric Surgery.  5) Chronic Care/Health Maintenance:  -Patient is on ACEI/ARB and Statin medications and encouraged to continue to follow up with Ophthalmology, Podiatrist at least yearly or according to recommendations, and advised to  stay away from smoking. I have recommended yearly flu vaccine and pneumonia vaccination at least every 5 years; moderate intensity exercise for up to 150 minutes weekly; and  sleep for at least 7 hours a day.  - I advised patient to maintain close follow up with Mikey Kirschner, MD for primary care needs.  - Time spent with the patient: 25 min, of which >50% was spent in reviewing her blood glucose logs , discussing her hypo- and hyper-glycemic episodes, reviewing her current and  previous labs and insulin doses and developing a plan to avoid hypo- and hyper-glycemia. Please refer to Patient Instructions for Blood Glucose Monitoring and Insulin/Medications Dosing Guide"  in media tab for additional information.  Follow up plan: -Return in about 3 months (around 03/12/2018).  Lisa Lloyd, MD Phone: [phone]  Fax: [phone]  -  This note was partially dictated with voice recognition software. Similar sounding words can be transcribed inadequately or may not  be corrected upon review.  12/12/2017, 2:11 PM

## 2017-12-12 NOTE — Patient Instructions (Signed)

## 2017-12-27 ENCOUNTER — Encounter: Payer: Self-pay | Admitting: Family Medicine

## 2017-12-27 ENCOUNTER — Ambulatory Visit (INDEPENDENT_AMBULATORY_CARE_PROVIDER_SITE_OTHER): Payer: BLUE CROSS/BLUE SHIELD | Admitting: Family Medicine

## 2017-12-27 VITALS — BP 130/82 | Ht 60.0 in | Wt 219.8 lb

## 2017-12-27 DIAGNOSIS — J4541 Moderate persistent asthma with (acute) exacerbation: Secondary | ICD-10-CM | POA: Diagnosis not present

## 2017-12-27 DIAGNOSIS — I10 Essential (primary) hypertension: Secondary | ICD-10-CM

## 2017-12-27 DIAGNOSIS — G609 Hereditary and idiopathic neuropathy, unspecified: Secondary | ICD-10-CM

## 2017-12-27 DIAGNOSIS — E782 Mixed hyperlipidemia: Secondary | ICD-10-CM | POA: Diagnosis not present

## 2017-12-27 DIAGNOSIS — Z23 Encounter for immunization: Secondary | ICD-10-CM | POA: Diagnosis not present

## 2017-12-27 MED ORDER — LANSOPRAZOLE 15 MG PO CPDR: 15.0000 mg | DELAYED_RELEASE_CAPSULE | Freq: Every day | ORAL | 5 refills | Status: DC

## 2017-12-27 MED ORDER — FUROSEMIDE 40 MG PO TABS: 40.0000 mg | ORAL_TABLET | Freq: Every morning | ORAL | 5 refills | Status: DC

## 2017-12-27 MED ORDER — ENALAPRIL MALEATE 10 MG PO TABS: ORAL_TABLET | ORAL | 5 refills | Status: DC

## 2017-12-27 MED ORDER — NORTRIPTYLINE HCL 50 MG PO CAPS: ORAL_CAPSULE | ORAL | 5 refills | Status: DC

## 2017-12-27 MED ORDER — FLUTICASONE PROPIONATE HFA 110 MCG/ACT IN AERO: INHALATION_SPRAY | RESPIRATORY_TRACT | 5 refills | Status: DC

## 2017-12-27 MED ORDER — ALBUTEROL SULFATE HFA 108 (90 BASE) MCG/ACT IN AERS: 2.0000 | INHALATION_SPRAY | Freq: Four times a day (QID) | RESPIRATORY_TRACT | 5 refills | Status: DC | PRN

## 2017-12-27 MED ORDER — GABAPENTIN 400 MG PO CAPS: ORAL_CAPSULE | ORAL | 5 refills | Status: DC

## 2017-12-27 MED ORDER — FLUTICASONE PROPIONATE 50 MCG/ACT NA SUSP: 1.0000 | Freq: Every day | NASAL | 5 refills | Status: AC

## 2017-12-27 NOTE — Progress Notes (Signed)
   Subjective:    Patient ID: Lisa Burns, female    DOB: [birth year], 59 y.o.   MRN: [id number]  Hypertension  This is a chronic problem. The current episode started more than 1 year ago. Risk factors for coronary artery disease include dyslipidemia, obesity, post-menopausal state and diabetes mellitus. Treatments tried: vasotec, lasix, There are no compliance problems.     Blood pressure medicine and blood pressure levels reviewed today with patient. Compliant with blood pressure medicine. States does not miss a dose. No obvious side effects. Blood pressure generally good when checked elsewhere. Watching salt intake.  Nerve pain worse lately.  Compliant with the Neurontin.  No obvious side effects.  Notes ongoing numbness in the feet.  Causes some mild sleep disturbance at time  Asthma overall stable and later doing well.  Rare use of rescue inhaler.  Uses preventive agent faithfully.  No major side effects with it.  Trying to get some walking in.  Avoiding smoke exposure  exercsing a lot   Diet decent   Review of Systems No headache, no major weight loss or weight gain, no chest pain no back pain abdominal pain no change in bowel habits complete ROS otherwise negative     Objective:   Physical Exam Alert and oriented, vitals reviewed and stable, NAD ENT-TM's and ext canals WNL bilat via otoscopic exam Soft palate, tonsils and post pharynx WNL via oropharyngeal exam Neck-symmetric, no masses; thyroid nonpalpable and nontender Pulmonary-no tachypnea or accessory muscle use; Clear without wheezes via auscultation Card--no abnrml murmurs, rhythm reg and rate WNL Carotid pulses symmetric, without bruits        Assessment & Plan:  Impression 1 chronic neuropathy stable.  Medications overall helping  2.  Asthma clinically stable to continue preventive agents discussed  3.  Statin use.  Patient unable to tolerate Crestor.  Unable to tolerate pravastatin.  Not interested in  starting another one at this time.  4.  Hypertension good control discussed to maintain same meds  Medications refilled diet exercise discussed.  No blood work this time recheck in 6 months

## 2017-12-31 ENCOUNTER — Other Ambulatory Visit: Payer: Self-pay | Admitting: "Endocrinology

## 2018-02-06 ENCOUNTER — Ambulatory Visit (INDEPENDENT_AMBULATORY_CARE_PROVIDER_SITE_OTHER): Payer: BLUE CROSS/BLUE SHIELD

## 2018-02-06 ENCOUNTER — Ambulatory Visit: Payer: BLUE CROSS/BLUE SHIELD | Admitting: Orthopedic Surgery

## 2018-02-06 VITALS — BP 121/80 | HR 83 | Ht 60.0 in | Wt 219.0 lb

## 2018-02-06 DIAGNOSIS — M65311 Trigger thumb, right thumb: Secondary | ICD-10-CM | POA: Diagnosis not present

## 2018-02-06 DIAGNOSIS — M79644 Pain in right finger(s): Secondary | ICD-10-CM

## 2018-02-06 NOTE — Progress Notes (Signed)
Patient ID: Lisa Burns, female   DOB: [birth year], 59 y.o.   MRN: [id number]  Chief Complaint  Patient presents with  . Hand Pain    Right thumb pain for 3 weeks, no injury.    HPI Lisa Burns is a 57 y.o. female.  Who is right-hand dominant types all day presents with a several week history of dull aching pain over the A1 pulley and IP joint of the right thumb for several weeks associated with locking and catching  Review of Systems Review of Systems  Constitutional: Negative for chills, fever and unexpected weight change.  Skin: Negative.   Neurological: Negative for numbness.    Past Medical History:  Diagnosis Date  . Allergy   . Asthma   . Diabetes mellitus with neuropathy ([place])   . Diabetes mellitus without complication ([place])   . GERD (gastroesophageal reflux disease)   . Hypertension   . Microproteinuria   . Reflux   . Venous stasis     Past Surgical History:  Procedure Laterality Date  . CESAREAN SECTION    . COLONOSCOPY N/A 04/24/2016   Procedure: COLONOSCOPY;  Surgeon: Danie Binder, MD;  Location[facility];  Service: Endoscopy;  Laterality: N/A;  8:30 Am  . LAPAROSCOPIC NISSEN FUNDOPLICATION    . OTHER SURGICAL HISTORY     gerd surgery  . THROAT SURGERY  2000    Family History  Problem Relation Age of Onset  . Diabetes Mother   . Heart disease Father   . Cancer Father        lung  . Colon cancer Father   . Heart disease Sister   . Diabetes Sister   . Heart disease Brother   . Diabetes Brother   . Colon cancer Paternal Grandmother     Social History Social History   Tobacco Use  . Smoking status: Former Smoker    Packs/day: 1.00    Years: 5.00    Pack years: 5.00    Types: Cigarettes  . Smokeless tobacco: Never Used  Substance Use Topics  . Alcohol use: Yes    Alcohol/week: 0.0 oz    Comment: occasionally  . Drug use: No    Allergies  Allergen Reactions  . Augmentin [Amoxicillin-Pot Clavulanate]     GI upset  . Januvia  [Sitagliptin]   . Metformin And Related     GI upset  . Pravastatin     Pain in joints    Current Outpatient Medications  Medication Sig Dispense Refill  . albuterol (PROVENTIL HFA;VENTOLIN HFA) 108 (90 Base) MCG/ACT inhaler Inhale 2 puffs into the lungs every 6 (six) hours as needed for wheezing or shortness of breath. 1 Inhaler 5  . Dulaglutide (TRULICITY) 1.5 QI/2.9NL SOPN INJECT 1.5MG ( ONE PEN) INTO THE SKIN ONCE A WEEK 2 mL 2  . enalapril (VASOTEC) 10 MG tablet TAKE ONE (1) TABLET AT BEDTIME 90 tablet 5  . fluticasone (FLONASE) 50 MCG/ACT nasal spray Place 1 spray into both nostrils daily. 16 g 5  . fluticasone (FLOVENT HFA) 110 MCG/ACT inhaler INHALE 2 PUFFS IN LUNGS TWICE DAILY 12 g 5  . furosemide (LASIX) 40 MG tablet Take 1 tablet (40 mg total) by mouth every morning. 90 tablet 5  . gabapentin (NEURONTIN) 400 MG capsule TAKE ONE (1) CAPSULE THREE (3) TIMES EACH DAY 90 capsule 5  . glucose blood test strip Use as instructed 100 each 12  . Insulin Glargine (LANTUS SOLOSTAR) 100 UNIT/ML Solostar Pen Inject 70  Units into the skin at bedtime. 30 mL 2  . lansoprazole (PREVACID) 15 MG capsule Take 1 capsule (15 mg total) by mouth daily. 30 capsule 5  . LANTUS SOLOSTAR 100 UNIT/ML Solostar Pen ADMINISTER UP TO 60 UNITS UNDER THE SKIN DAILY AT 10 PM 15 mL 0  . loratadine (CLARITIN) 10 MG tablet Take 10 mg by mouth. Take two everyday    . nortriptyline (PAMELOR) 50 MG capsule TAKE TWO CAPSULES BY MOUTH AT BEDTIME DAILY 180 capsule 5  . triamcinolone cream (KENALOG) 0.1 % Apply 1 application topically 2 (two) times daily. 15 g 0  . valACYclovir (VALTREX) 500 MG tablet Take 500 mg by mouth daily as needed (cold sore flare ups).      No current facility-administered medications for this visit.      Physical Exam Physical Exam Blood pressure 121/80, pulse 83, height 5' (1.524 m), weight 219 lb (99.3 kg). Appearance, there are no abnormalities in terms of appearance the patient was  well-developed and well-nourished. The grooming and hygiene were normal.  Mental status orientation, there was normal alertness and orientation Mood pleasant Ambulatory status normal with no assistive devices  Examination of the right hand reveals tenderness over the A1 pulley of the thumb  Range of motion remains normal with clicking on flexion extension.  Stability tests show no abnormality of the IP joints are MP joint. The FDP and FDS strength is normal Skin warm dry and intact without laceration or ulceration or erythema Neurologic examination normal sensation Vascular examination normal pulses with warm extremity and normal capillary refill  The opposite extremity left thumb normal range of motion stability strength alignment pulse perfusion and sensation     MEDICAL DECISION SECTION  xrays ordered yes  My independent reading of xrays: Normal left thumb x-ray   Encounter Diagnosis  Name Primary?  . Thumb pain, right Yes     PLAN:    Injection? Yes Trigger finger injection  Diagnosis    Trigger right thumb Procedure injection A1 pulley Medications lidocaine 1% 1 mL and Depo-Medrol 40 mg 1 mL Skin prep alcohol and ethyl chloride Verbal consent was obtained Timeout confirmed the injection site  After cleaning the skin with alcohol and anesthetizing the skin with ethyl chloride the A1 pulley was palpated and the injection was performed without complication

## 2018-02-06 NOTE — Patient Instructions (Addendum)
Trigger Finger Trigger finger (stenosing tenosynovitis) is a condition that causes a finger to get stuck in a bent position. Each finger has a tough, cord-like tissue that connects muscle to bone (tendon), and each tendon is surrounded by a tunnel of tissue (tendon sheath). To move your finger, your tendon needs to slide freely through the sheath. Trigger finger happens when the tendon or the sheath thickens, making it difficult to move your finger. Trigger finger can affect any finger or a thumb. It may affect more than one finger. Mild cases may clear up with rest and medicine. Severe cases require more treatment. What are the causes? Trigger finger is caused by a thickened finger tendon or tendon sheath. The cause of this thickening is not known. What increases the risk? The following factors may make you more likely to develop this condition:  Doing activities that require a strong grip.  Having rheumatoid arthritis, gout, or diabetes.  Being 40-60 years old.  Being a woman.  What are the signs or symptoms? Symptoms of this condition include:  Pain when bending or straightening your finger.  Tenderness or swelling where your finger attaches to the palm of your hand.  A lump in the palm of your hand or on the inside of your finger.  Hearing a popping sound when you try to straighten your finger.  Feeling a popping, catching, or locking sensation when you try to straighten your finger.  Being unable to straighten your finger.  How is this diagnosed? This condition is diagnosed based on your symptoms and a physical exam. How is this treated? This condition may be treated by:  Resting your finger and avoiding activities that make symptoms worse.  Wearing a finger splint to keep your finger in a slightly bent position.  Taking NSAIDs to relieve pain and swelling.  Injecting medicine (steroids) into the tendon sheath to reduce swelling and irritation. Injections may need to be  repeated.  Having surgery to open the tendon sheath. This may be done if other treatments do not work and you cannot straighten your finger. You may need physical therapy after surgery.  Follow these instructions at home:  Use moist heat to help reduce pain and swelling as told by your health care provider.  Rest your finger and avoid activities that make pain worse. Return to normal activities as told by your health care provider.  If you have a splint, wear it as told by your health care provider.  Take over-the-counter and prescription medicines only as told by your health care provider.  Keep all follow-up visits as told by your health care provider. This is important. Contact a health care provider if:  Your symptoms are not improving with home care. Summary  Trigger finger (stenosing tenosynovitis) causes your finger to get stuck in a bent position, and it can make it difficult and painful to straighten your finger.  This condition develops when a finger tendon or tendon sheath thickens.  Treatment starts with resting, wearing a splint, and taking NSAIDs.  In severe cases, surgery to open the tendon sheath may be needed. This information is not intended to replace advice given to you by your health care provider. Make sure you discuss any questions you have with your health care provider. Document Released: 08/26/2004 Document Revised: 10/17/2016 Document Reviewed: 10/17/2016 [employer] Interactive Patient Education  2017 [employer]  

## 2018-03-09 ENCOUNTER — Other Ambulatory Visit: Payer: Self-pay | Admitting: "Endocrinology

## 2018-03-14 ENCOUNTER — Ambulatory Visit: Payer: Self-pay | Admitting: "Endocrinology

## 2018-03-28 DIAGNOSIS — E1165 Type 2 diabetes mellitus with hyperglycemia: Secondary | ICD-10-CM | POA: Diagnosis not present

## 2018-03-28 DIAGNOSIS — E118 Type 2 diabetes mellitus with unspecified complications: Secondary | ICD-10-CM | POA: Diagnosis not present

## 2018-03-28 DIAGNOSIS — Z794 Long term (current) use of insulin: Secondary | ICD-10-CM | POA: Diagnosis not present

## 2018-03-29 LAB — COMPLETE METABOLIC PANEL WITH GFR
AG Ratio: 1.9 (calc) (ref 1.0–2.5)
ALT: 19 U/L (ref 6–29)
AST: 15 U/L (ref 10–35)
Albumin: 4 g/dL (ref 3.6–5.1)
Alkaline phosphatase (APISO): 96 U/L (ref 33–130)
BUN: 18 mg/dL (ref 7–25)
CALCIUM: 9.2 mg/dL (ref 8.6–10.4)
CO2: 29 mmol/L (ref 20–32)
CREATININE: 0.56 mg/dL (ref 0.50–1.05)
Chloride: 105 mmol/L (ref 98–110)
GFR, EST NON AFRICAN AMERICAN: 103 mL/min/{1.73_m2} (ref >=60)
GFR, Est African American: 119 mL/min/{1.73_m2} (ref >=60)
Globulin: 2.1 g/dL (calc) (ref 1.9–3.7)
Glucose, Bld: 78 mg/dL (ref 65–99)
Potassium: 3.7 mmol/L (ref 3.5–5.3)
Sodium: 141 mmol/L (ref 135–146)
Total Bilirubin: 0.4 mg/dL (ref 0.2–1.2)
Total Protein: 6.1 g/dL (ref 6.1–8.1)

## 2018-03-29 LAB — HEMOGLOBIN A1C
Hgb A1c MFr Bld: 6.9 % of total Hgb — ABNORMAL HIGH (ref <5.7)
Mean Plasma Glucose: 151 (calc)
eAG (mmol/L): 8.4 (calc)

## 2018-04-02 ENCOUNTER — Ambulatory Visit: Payer: Self-pay | Admitting: "Endocrinology

## 2018-04-04 ENCOUNTER — Ambulatory Visit: Payer: BLUE CROSS/BLUE SHIELD | Admitting: "Endocrinology

## 2018-04-04 ENCOUNTER — Encounter: Payer: Self-pay | Admitting: "Endocrinology

## 2018-04-04 VITALS — BP 120/78 | HR 96 | Ht 60.0 in | Wt 222.0 lb

## 2018-04-04 DIAGNOSIS — Z794 Long term (current) use of insulin: Secondary | ICD-10-CM | POA: Diagnosis not present

## 2018-04-04 DIAGNOSIS — I10 Essential (primary) hypertension: Secondary | ICD-10-CM | POA: Diagnosis not present

## 2018-04-04 DIAGNOSIS — E782 Mixed hyperlipidemia: Secondary | ICD-10-CM | POA: Diagnosis not present

## 2018-04-04 DIAGNOSIS — E118 Type 2 diabetes mellitus with unspecified complications: Secondary | ICD-10-CM

## 2018-04-04 DIAGNOSIS — E1165 Type 2 diabetes mellitus with hyperglycemia: Secondary | ICD-10-CM

## 2018-04-04 DIAGNOSIS — IMO0002 Reserved for concepts with insufficient information to code with codable children: Secondary | ICD-10-CM

## 2018-04-04 MED ORDER — EVOLOCUMAB 140 MG/ML ~~LOC~~ SOAJ: 140.0000 mg | SUBCUTANEOUS | 3 refills | Status: DC

## 2018-04-04 NOTE — Progress Notes (Signed)
Subjective:    Patient ID: Lisa Burns, female    DOB: 1961/05/03, PCP Mikey Kirschner, MD   Past Medical History:  Diagnosis Date  . Allergy   . Asthma   . Diabetes mellitus with neuropathy ([place])   . Diabetes mellitus without complication ([place])   . GERD (gastroesophageal reflux disease)   . Hypertension   . Microproteinuria   . Reflux   . Venous stasis    Past Surgical History:  Procedure Laterality Date  . CESAREAN SECTION    . COLONOSCOPY N/A 04/24/2016   Procedure: COLONOSCOPY;  Surgeon: Danie Binder, MD;  Location: [facility];  Service: Endoscopy;  Laterality: N/A;  8:30 Am  . LAPAROSCOPIC NISSEN FUNDOPLICATION    . OTHER SURGICAL HISTORY     gerd surgery  . THROAT SURGERY  2000   Social History   Socioeconomic History  . Marital status: Married    Spouse name: Not on file  . Number of children: 1  . Years of education: HS  . Highest education level: Not on file  Occupational History  . Occupation: [employer]   Social Needs  . Financial resource strain: Not on file  . Food insecurity:    Worry: Not on file    Inability: Not on file  . Transportation needs:    Medical: Not on file    Non-medical: Not on file  Tobacco Use  . Smoking status: Former Smoker    Packs/day: 1.00    Years: 5.00    Pack years: 5.00    Types: Cigarettes  . Smokeless tobacco: Never Used  Substance and Sexual Activity  . Alcohol use: Yes    Alcohol/week: 0.0 oz    Comment: occasionally  . Drug use: No  . Sexual activity: Not on file  Lifestyle  . Physical activity:    Days per week: Not on file    Minutes per session: Not on file  . Stress: Not on file  Relationships  . Social connections:    Talks on phone: Not on file    Gets together: Not on file    Attends religious service: Not on file    Active member of club or organization: Not on file    Attends meetings of clubs or organizations: Not on file    Relationship status: Not on file  Other Topics  Concern  . Not on file  Social History Narrative   Drinks 2 cans of soda a day    Outpatient Encounter Medications as of 04/04/2018  Medication Sig  . albuterol (PROVENTIL HFA;VENTOLIN HFA) 108 (90 Base) MCG/ACT inhaler Inhale 2 puffs into the lungs every 6 (six) hours as needed for wheezing or shortness of breath.  . enalapril (VASOTEC) 10 MG tablet TAKE ONE (1) TABLET AT BEDTIME  . Evolocumab (REPATHA SURECLICK) 778 MG/ML SOAJ Inject 140 mg into the skin every 14 (fourteen) days.  . fluticasone (FLONASE) 50 MCG/ACT nasal spray Place 1 spray into both nostrils daily.  . fluticasone (FLOVENT HFA) 110 MCG/ACT inhaler INHALE 2 PUFFS IN LUNGS TWICE DAILY  . furosemide (LASIX) 40 MG tablet Take 1 tablet (40 mg total) by mouth every morning.  . gabapentin (NEURONTIN) 400 MG capsule TAKE ONE (1) CAPSULE THREE (3) TIMES EACH DAY  . glucose blood test strip Use as instructed  . Insulin Glargine (LANTUS SOLOSTAR) 100 UNIT/ML Solostar Pen Inject 70 Units into the skin at bedtime.  . lansoprazole (PREVACID) 15 MG capsule Take 1  capsule (15 mg total) by mouth daily.  Marland Kitchen loratadine (CLARITIN) 10 MG tablet Take 10 mg by mouth. Take two everyday  . nortriptyline (PAMELOR) 50 MG capsule TAKE TWO CAPSULES BY MOUTH AT BEDTIME DAILY  . triamcinolone cream (KENALOG) 0.1 % Apply 1 application topically 2 (two) times daily.  . TRULICITY 1.5 HB/7.1IR SOPN INJECT 1.5 NH(ONE PEN) INTO THE SKIN ONCE A WEEK  . valACYclovir (VALTREX) 500 MG tablet Take 500 mg by mouth daily as needed (cold sore flare ups).   . [DISCONTINUED] LANTUS SOLOSTAR 100 UNIT/ML Solostar Pen ADMINISTER UP TO 60 UNITS UNDER THE SKIN DAILY AT 10 PM   No facility-administered encounter medications on file as of 04/04/2018.    ALLERGIES: Allergies  Allergen Reactions  . Augmentin [Amoxicillin-Pot Clavulanate]     GI upset  . Januvia [Sitagliptin]   . Metformin And Related     GI upset  . Pravastatin     Pain in joints   VACCINATION  STATUS: Immunization History  Administered Date(s) Administered  . Influenza, Seasonal, Injecte, Preservative Fre 08/09/2016  . Influenza,inj,Quad PF,6+ Mos 12/27/2017  . Pneumococcal Polysaccharide-23 12/30/2013    Diabetes  She presents for her follow-up diabetic visit. She has type 2 diabetes mellitus. Onset time: She was diagnosed at approximate age of 50 years. Her disease course has been stable. There are no hypoglycemic associated symptoms. Pertinent negatives for hypoglycemia include no confusion, headaches, pallor or seizures. There are no diabetic associated symptoms. Pertinent negatives for diabetes include no chest pain, no polydipsia, no polyphagia and no polyuria. There are no hypoglycemic complications. Symptoms are stable. There are no diabetic complications. Risk factors for coronary artery disease include diabetes mellitus, dyslipidemia, family history, hypertension, obesity, sedentary lifestyle and tobacco exposure. Current diabetic treatment includes insulin injections and oral agent (monotherapy). She is compliant with treatment most of the time. Her weight is stable. She is following a generally unhealthy diet. When asked about meal planning, she reported none. She has not had a previous visit with a dietitian. She never participates in exercise. There is no change in her home blood glucose trend. Her breakfast blood glucose range is generally 140-180 mg/dl. Her overall blood glucose range is 140-180 mg/dl. An ACE inhibitor/angiotensin II receptor blocker is being taken. Eye exam is current.  Hyperlipidemia  This is a chronic problem. The current episode started more than 1 year ago. Exacerbating diseases include diabetes and obesity. Pertinent negatives include no chest pain, myalgias or shortness of breath. She is currently on no antihyperlipidemic treatment. Risk factors for coronary artery disease include diabetes mellitus, dyslipidemia, hypertension, obesity and a sedentary  lifestyle.  Hypertension  This is a chronic problem. The current episode started more than 1 year ago. The problem is controlled. Pertinent negatives include no chest pain, headaches, palpitations or shortness of breath. Risk factors for coronary artery disease include dyslipidemia, diabetes mellitus, obesity and sedentary lifestyle. Past treatments include ACE inhibitors.    Review of Systems  Constitutional: Negative for unexpected weight change.  HENT: Negative for trouble swallowing and voice change.   Eyes: Negative for visual disturbance.  Respiratory: Negative for cough, shortness of breath and wheezing.   Cardiovascular: Negative for chest pain, palpitations and leg swelling.  Gastrointestinal: Negative for diarrhea, nausea and vomiting.  Endocrine: Negative for cold intolerance, heat intolerance, polydipsia, polyphagia and polyuria.  Musculoskeletal: Negative for arthralgias and myalgias.  Skin: Negative for color change, pallor, rash and wound.  Neurological: Negative for seizures and headaches.  Psychiatric/Behavioral: Negative  for confusion and suicidal ideas.    Objective:    BP 120/78   Pulse 96   Ht 5' (1.524 m)   Wt 222 lb (100.7 kg)   BMI 43.36 kg/m   Wt Readings from Last 3 Encounters:  04/04/18 222 lb (100.7 kg)  02/06/18 219 lb (99.3 kg)  12/27/17 219 lb 12.8 oz (99.7 kg)    Physical Exam  Constitutional: She is oriented to person, place, and time. She appears well-developed.  HENT:  Head: Normocephalic and atraumatic.  Eyes: EOM are normal.  Neck: Normal range of motion. Neck supple. No tracheal deviation present. No thyromegaly present.  Cardiovascular: Normal rate.  Abdominal: There is no tenderness. There is no guarding.  Musculoskeletal: Normal range of motion. She exhibits no edema.  Neurological: She is alert and oriented to person, place, and time. She has normal reflexes. No cranial nerve deficit. Coordination normal.  Skin: Skin is warm and dry.  No rash noted. No erythema. No pallor.  Psychiatric: She has a normal mood and affect. Judgment normal.    Results for orders placed or performed in visit on 12/12/17  COMPLETE METABOLIC PANEL WITH GFR  Result Value Ref Range   Glucose, Bld 78 65 - 99 mg/dL   BUN 18 7 - 25 mg/dL   Creat 0.56 0.50 - 1.05 mg/dL   GFR, Est Non African American 103 > OR = 60 mL/min/1.85m2   GFR, Est African American 119 > OR = 60 mL/min/1.41m2   BUN/Creatinine Ratio NOT APPLICABLE 6 - 22 (calc)   Sodium 141 135 - 146 mmol/L   Potassium 3.7 3.5 - 5.3 mmol/L   Chloride 105 98 - 110 mmol/L   CO2 29 20 - 32 mmol/L   Calcium 9.2 8.6 - 10.4 mg/dL   Total Protein 6.1 6.1 - 8.1 g/dL   Albumin 4.0 3.6 - 5.1 g/dL   Globulin 2.1 1.9 - 3.7 g/dL (calc)   AG Ratio 1.9 1.0 - 2.5 (calc)   Total Bilirubin 0.4 0.2 - 1.2 mg/dL   Alkaline phosphatase (APISO) 96 33 - 130 U/L   AST 15 10 - 35 U/L   ALT 19 6 - 29 U/L  Hemoglobin A1c  Result Value Ref Range   Hgb A1c MFr Bld 6.9 (H) <5.7 % of total Hgb   Mean Plasma Glucose 151 (calc)   eAG (mmol/L) 8.4 (calc)   Diabetic Labs (most recent): Lab Results  Component Value Date   HGBA1C 6.9 (H) 03/28/2018   HGBA1C 6.9 (H) 12/04/2017   HGBA1C 7.3 (H) 03/30/2017   Lipid Panel     Component Value Date/Time   CHOL 208 (H) 11/29/2016 0729   TRIG 217 (H) 11/29/2016 0729   HDL 44 (L) 11/29/2016 0729   CHOLHDL 4.7 11/29/2016 0729   VLDL 43 (H) 11/29/2016 0729   LDLCALC 121 (H) 11/29/2016 0729     Assessment & Plan:   1. Uncontrolled type 2 diabetes mellitus with complication, with long-term current use of insulin ([place]) -She returns with stable glycemic profile and A1c at 6.9%, generally improving from 10+ %.  - She  remains at a high risk for more acute and chronic complications of diabetes which include CAD, CVA, CKD, retinopathy, and neuropathy. These are all discussed in detail with the patient.  - Glucose logs and insulin administration records pertaining to  this visit,  to be scanned into patient's records.  Recent labs reviewed.   - I have re-counseled the patient on diet management and  weight loss  by adopting a carbohydrate restricted / protein rich  Diet.  -  Suggestion is made for her to avoid simple carbohydrates  from her diet including Cakes, Sweet Desserts / Pastries, Ice Cream, Soda (diet and regular), Sweet Tea, Candies, Chips, Cookies, Store Bought Juices, Alcohol in Excess of  1-2 drinks a day, Artificial Sweeteners, and "Sugar-free" Products. This will help patient to have stable blood glucose profile and potentially avoid unintended weight gain.  - Patient is advised to stick to a routine mealtimes to eat 3 meals  a day and avoid unnecessary snacks ( to snack only to correct hypoglycemia).  - I have approached patient with the following individualized plan to manage diabetes and patient agrees.  -I advised her to continue Lantus 70 units nightly,  she will not need prandial insulin for now.  -Continue Trulicity 1.5mg  subcutaneously weekly.   - Patient specific target  for A1c; LDL, HDL, Triglycerides, and  Waist Circumference were discussed in detail.  2) BP/HTN: Her blood pressure is controlled to target.  She is to continue her enalapril 10 mg p.o. daily.    3) Lipids/HPL:  LDL back at 121 from 87.  She did not tolerate statins including Crestor.  I discussed and added Repatha 140 mg subcutaneously every 14 days in hopes of lowering her LDL to below 70. Insurance did not cover Lovaza.   4)  Weight/Diet: exercise, and carbohydrates information provided. She will benefit from one of the recently approved weight loss medications. I discussed and initiated Qsymia for her. She is not ready for bariatric Surgery.  5) Chronic Care/Health Maintenance:  -Patient is on ACEI/ARB and Statin medications and encouraged to continue to follow up with Ophthalmology, Podiatrist at least yearly or according to recommendations, and advised to   stay away from smoking. I have recommended yearly flu vaccine and pneumonia vaccination at least every 5 years; moderate intensity exercise for up to 150 minutes weekly; and  sleep for at least 7 hours a day.  - I advised patient to maintain close follow up with Mikey Kirschner, MD for primary care needs.  - Time spent with the patient: 25 min, of which >50% was spent in reviewing her blood glucose logs , discussing her hypo- and hyper-glycemic episodes, reviewing her current and  previous labs and insulin doses and developing a plan to avoid hypo- and hyper-glycemia. Please refer to Patient Instructions for Blood Glucose Monitoring and Insulin/Medications Dosing Guide"  in media tab for additional information. Lisa Burns participated in the discussions, expressed understanding, and voiced agreement with the above plans.  All questions were answered to her satisfaction. she is encouraged to contact clinic should she have any questions or concerns prior to her return visit.   Follow up plan: -Return in about 4 months (around 08/05/2018) for follow up with pre-visit labs, meter, and logs.  Glade Lloyd, MD Phone: [phone]  Fax: [phone]  -  This note was partially dictated with voice recognition software. Similar sounding words can be transcribed inadequately or may not  be corrected upon review.  04/04/2018, 4:12 PM

## 2018-04-04 NOTE — Patient Instructions (Signed)

## 2018-04-10 ENCOUNTER — Telehealth: Payer: Self-pay

## 2018-04-10 ENCOUNTER — Other Ambulatory Visit: Payer: Self-pay | Admitting: "Endocrinology

## 2018-04-10 MED ORDER — EXENATIDE ER 2 MG/0.85ML ~~LOC~~ AUIJ: 2.0000 mg | AUTO-INJECTOR | SUBCUTANEOUS | 2 refills | Status: DC

## 2018-04-10 NOTE — Telephone Encounter (Signed)
Pts insurance has denied Repatha and Trulicity

## 2018-04-10 NOTE — Telephone Encounter (Signed)
We will prescribe Bydureon to replace Trulicity, but drop Repatha.

## 2018-05-06 ENCOUNTER — Other Ambulatory Visit: Payer: Self-pay | Admitting: "Endocrinology

## 2018-06-04 ENCOUNTER — Other Ambulatory Visit: Payer: Self-pay | Admitting: "Endocrinology

## 2018-06-24 ENCOUNTER — Other Ambulatory Visit: Payer: Self-pay

## 2018-06-25 ENCOUNTER — Encounter: Payer: Self-pay | Admitting: Family Medicine

## 2018-06-25 ENCOUNTER — Ambulatory Visit: Payer: BLUE CROSS/BLUE SHIELD | Admitting: Family Medicine

## 2018-06-25 VITALS — BP 122/72 | Temp 98.5°F | Ht 60.0 in | Wt 212.0 lb

## 2018-06-25 DIAGNOSIS — J329 Chronic sinusitis, unspecified: Secondary | ICD-10-CM

## 2018-06-25 DIAGNOSIS — J4541 Moderate persistent asthma with (acute) exacerbation: Secondary | ICD-10-CM | POA: Diagnosis not present

## 2018-06-25 MED ORDER — CEFDINIR 300 MG PO CAPS: 300.0000 mg | ORAL_CAPSULE | Freq: Two times a day (BID) | ORAL | 0 refills | Status: DC

## 2018-06-25 NOTE — Progress Notes (Signed)
   Subjective:    Patient ID: Lisa Burns, female    DOB: Dec 14, 1960, 57 y.o.   MRN: [id number]  HPI Patient is here today with complaints of a uri. Since Saturday am  She has been coughing, bilateral ear pain, headache,and a sore throat.She has been taking mucinex and her inhaler and allergy medications.  Hit hard and cough and cong and dranage  No major gunkinesss from head  Pos prod cough   No hgigh fevdr  Sig oarse  Frontal headache, worse with cough   Pos pain with swalloing       Review of Systems No headache, no major weight loss or weight gain, no chest pain no back pain abdominal pain no change in bowel habits complete ROS otherwise negative     Objective:   Physical Exam   Alert, mild malaise. Hydration good Vitals stable. frontal/ maxillary tenderness evident positive nasal congestion. pharynx normal neck supple  lungs clear/no crackles or wheezes. heart regular in rhythm      Assessment & Plan:  Impression rhinosinusitis likely post viral, discussed with patient. plan antibiotics prescribed. Questions answered. Symptomatic care discussed. warning signs discussed. WSL Prednisone taper also added for exacerbation of asthma

## 2018-07-08 ENCOUNTER — Telehealth: Payer: Self-pay | Admitting: Family Medicine

## 2018-07-08 MED ORDER — CLARITHROMYCIN 500 MG PO TABS: 500.0000 mg | ORAL_TABLET | Freq: Two times a day (BID) | ORAL | 0 refills | Status: DC

## 2018-07-08 NOTE — Telephone Encounter (Signed)
Prescription sent electronically to pharmacy. Patient notified. 

## 2018-07-08 NOTE — Telephone Encounter (Signed)
Patient was given omnicef and prednisone 06/25/18

## 2018-07-08 NOTE — Telephone Encounter (Signed)
Pt finished antibiotics, still no better, was seen 06/25/18, wants to know if we can order another round of antibiotics  Please advise   [place]

## 2018-07-08 NOTE — Telephone Encounter (Signed)
[id number] one bid for ten d

## 2018-08-05 ENCOUNTER — Ambulatory Visit: Payer: BLUE CROSS/BLUE SHIELD | Admitting: "Endocrinology

## 2018-08-27 DIAGNOSIS — E1165 Type 2 diabetes mellitus with hyperglycemia: Secondary | ICD-10-CM | POA: Diagnosis not present

## 2018-08-27 DIAGNOSIS — E118 Type 2 diabetes mellitus with unspecified complications: Secondary | ICD-10-CM | POA: Diagnosis not present

## 2018-08-27 DIAGNOSIS — Z794 Long term (current) use of insulin: Secondary | ICD-10-CM | POA: Diagnosis not present

## 2018-08-28 LAB — COMPLETE METABOLIC PANEL WITH GFR
AG Ratio: 2.1 (calc) (ref 1.0–2.5)
ALBUMIN MSPROF: 4.1 g/dL (ref 3.6–5.1)
ALKALINE PHOSPHATASE (APISO): 103 U/L (ref 33–130)
ALT: 17 U/L (ref 6–29)
AST: 15 U/L (ref 10–35)
BILIRUBIN TOTAL: 0.5 mg/dL (ref 0.2–1.2)
BUN: 15 mg/dL (ref 7–25)
CO2: 28 mmol/L (ref 20–32)
CREATININE: 0.71 mg/dL (ref 0.50–1.05)
Calcium: 9.2 mg/dL (ref 8.6–10.4)
Chloride: 104 mmol/L (ref 98–110)
GFR, Est African American: 109 mL/min/{1.73_m2} (ref >=60)
GFR, Est Non African American: 94 mL/min/{1.73_m2} (ref >=60)
GLOBULIN: 2 g/dL (ref 1.9–3.7)
Glucose, Bld: 92 mg/dL (ref 65–99)
Potassium: 3.6 mmol/L (ref 3.5–5.3)
SODIUM: 140 mmol/L (ref 135–146)
Total Protein: 6.1 g/dL (ref 6.1–8.1)

## 2018-08-28 LAB — HEMOGLOBIN A1C
Hgb A1c MFr Bld: 5.7 % of total Hgb — ABNORMAL HIGH (ref <5.7)
Mean Plasma Glucose: 117 (calc)
eAG (mmol/L): 6.5 (calc)

## 2018-08-28 LAB — MICROALBUMIN / CREATININE URINE RATIO
CREATININE, URINE: 185 mg/dL (ref 20–275)
Microalb Creat Ratio: 5 mcg/mg creat (ref <30)
Microalb, Ur: 1 mg/dL

## 2018-08-29 ENCOUNTER — Ambulatory Visit: Payer: BLUE CROSS/BLUE SHIELD | Admitting: "Endocrinology

## 2018-08-29 ENCOUNTER — Encounter: Payer: Self-pay | Admitting: "Endocrinology

## 2018-08-29 VITALS — BP 127/84 | HR 92 | Ht 60.0 in | Wt 208.0 lb

## 2018-08-29 DIAGNOSIS — E1165 Type 2 diabetes mellitus with hyperglycemia: Secondary | ICD-10-CM | POA: Diagnosis not present

## 2018-08-29 DIAGNOSIS — IMO0002 Reserved for concepts with insufficient information to code with codable children: Secondary | ICD-10-CM

## 2018-08-29 DIAGNOSIS — E118 Type 2 diabetes mellitus with unspecified complications: Secondary | ICD-10-CM | POA: Diagnosis not present

## 2018-08-29 DIAGNOSIS — I10 Essential (primary) hypertension: Secondary | ICD-10-CM | POA: Diagnosis not present

## 2018-08-29 DIAGNOSIS — E782 Mixed hyperlipidemia: Secondary | ICD-10-CM | POA: Diagnosis not present

## 2018-08-29 DIAGNOSIS — Z794 Long term (current) use of insulin: Secondary | ICD-10-CM

## 2018-08-29 MED ORDER — INSULIN GLARGINE 100 UNIT/ML SOLOSTAR PEN: 50.0000 [IU] | PEN_INJECTOR | Freq: Every day | SUBCUTANEOUS | 2 refills | Status: DC

## 2018-08-29 NOTE — Progress Notes (Signed)
Endocrinology follow-up note   Subjective:    Patient ID: Lisa Burns, female    DOB: 1961-08-05, PCP Mikey Kirschner, MD   Past Medical History:  Diagnosis Date  . Allergy   . Asthma   . Diabetes mellitus with neuropathy ([place])   . Diabetes mellitus without complication ([place])   . GERD (gastroesophageal reflux disease)   . Hypertension   . Microproteinuria   . Reflux   . Venous stasis    Past Surgical History:  Procedure Laterality Date  . CESAREAN SECTION    . COLONOSCOPY N/A 04/24/2016   Procedure: COLONOSCOPY;  Surgeon: Danie Binder, MD;  Location: [facility];  Service: Endoscopy;  Laterality: N/A;  8:30 Am  . LAPAROSCOPIC NISSEN FUNDOPLICATION    . OTHER SURGICAL HISTORY     gerd surgery  . THROAT SURGERY  2000   Social History   Socioeconomic History  . Marital status: Married    Spouse name: Not on file  . Number of children: 1  . Years of education: HS  . Highest education level: Not on file  Occupational History  . Occupation: [employer]   Social Needs  . Financial resource strain: Not on file  . Food insecurity:    Worry: Not on file    Inability: Not on file  . Transportation needs:    Medical: Not on file    Non-medical: Not on file  Tobacco Use  . Smoking status: Former Smoker    Packs/day: 1.00    Years: 5.00    Pack years: 5.00    Types: Cigarettes  . Smokeless tobacco: Never Used  Substance and Sexual Activity  . Alcohol use: Yes    Alcohol/week: 0.0 standard drinks    Comment: occasionally  . Drug use: No  . Sexual activity: Not on file  Lifestyle  . Physical activity:    Days per week: Not on file    Minutes per session: Not on file  . Stress: Not on file  Relationships  . Social connections:    Talks on phone: Not on file    Gets together: Not on file    Attends religious service: Not on file    Active member of club or organization: Not on file    Attends meetings of clubs or organizations: Not on file     Relationship status: Not on file  Other Topics Concern  . Not on file  Social History Narrative   Drinks 2 cans of soda a day    Outpatient Encounter Medications as of 08/29/2018  Medication Sig  . albuterol (PROVENTIL HFA;VENTOLIN HFA) 108 (90 Base) MCG/ACT inhaler Inhale 2 puffs into the lungs every 6 (six) hours as needed for wheezing or shortness of breath. (Patient not taking: Reported on 06/25/2018)  . cefdinir (OMNICEF) 300 MG capsule Take 1 capsule (300 mg total) by mouth 2 (two) times daily.  . clarithromycin (BIAXIN) 500 MG tablet Take 1 tablet (500 mg total) by mouth 2 (two) times daily.  . enalapril (VASOTEC) 10 MG tablet TAKE ONE (1) TABLET AT BEDTIME  . fluticasone (FLONASE) 50 MCG/ACT nasal spray Place 1 spray into both nostrils daily.  . fluticasone (FLOVENT HFA) 110 MCG/ACT inhaler INHALE 2 PUFFS IN LUNGS TWICE DAILY  . furosemide (LASIX) 40 MG tablet Take 1 tablet (40 mg total) by mouth every morning.  . gabapentin (NEURONTIN) 400 MG capsule TAKE ONE (1) CAPSULE THREE (3) TIMES EACH DAY  . glucose blood test  strip Use as instructed  . Insulin Glargine (LANTUS SOLOSTAR) 100 UNIT/ML Solostar Pen Inject 50 Units into the skin at bedtime.  . lansoprazole (PREVACID) 15 MG capsule Take 1 capsule (15 mg total) by mouth daily.  Marland Kitchen loratadine (CLARITIN) 10 MG tablet Take 10 mg by mouth. Take two everyday  . nortriptyline (PAMELOR) 50 MG capsule TAKE TWO CAPSULES BY MOUTH AT BEDTIME DAILY  . triamcinolone cream (KENALOG) 0.1 % Apply 1 application topically 2 (two) times daily.  . TRULICITY 1.5 NW/2.9FA SOPN INJECT 1.5 NH(ONE PEN) INTO THE SKIN ONCE A WEEK  . valACYclovir (VALTREX) 500 MG tablet Take 500 mg by mouth daily as needed (cold sore flare ups).   . [DISCONTINUED] Exenatide ER (BYDUREON BCISE) 2 MG/0.85ML AUIJ Inject 2 mg into the skin once a week.  . [DISCONTINUED] LANTUS SOLOSTAR 100 UNIT/ML Solostar Pen ADMINISTER 70 UNITS UNDER THE SKIN AT BEDTIME   No  facility-administered encounter medications on file as of 08/29/2018.    ALLERGIES: Allergies  Allergen Reactions  . Augmentin [Amoxicillin-Pot Clavulanate]     GI upset  . Januvia [Sitagliptin]   . Metformin And Related     GI upset  . Pravastatin     Pain in joints   VACCINATION STATUS: Immunization History  Administered Date(s) Administered  . Influenza, Seasonal, Injecte, Preservative Fre 08/09/2016  . Influenza,inj,Quad PF,6+ Mos 12/27/2017  . Pneumococcal Polysaccharide-23 12/30/2013    Diabetes  She presents for her follow-up diabetic visit. She has type 2 diabetes mellitus. Onset time: She was diagnosed at approximate age of 21 years. Her disease course has been improving. There are no hypoglycemic associated symptoms. Pertinent negatives for hypoglycemia include no confusion, headaches, pallor or seizures. There are no diabetic associated symptoms. Pertinent negatives for diabetes include no chest pain, no polydipsia, no polyphagia and no polyuria. There are no hypoglycemic complications. Symptoms are improving. There are no diabetic complications. Risk factors for coronary artery disease include diabetes mellitus, dyslipidemia, family history, hypertension, obesity, sedentary lifestyle and tobacco exposure. Current diabetic treatment includes insulin injections and oral agent (monotherapy). She is compliant with treatment most of the time. Her weight is decreasing steadily. She is following a generally unhealthy diet. When asked about meal planning, she reported none. She has not had a previous visit with a dietitian. She never participates in exercise. There is no change in her home blood glucose trend. Her breakfast blood glucose range is generally 110-130 mg/dl. Her overall blood glucose range is 110-130 mg/dl. An ACE inhibitor/angiotensin II receptor blocker is being taken. Eye exam is current.  Hyperlipidemia  This is a chronic problem. The current episode started more than 1  year ago. Exacerbating diseases include diabetes and obesity. Pertinent negatives include no chest pain, myalgias or shortness of breath. She is currently on no antihyperlipidemic treatment. Risk factors for coronary artery disease include diabetes mellitus, dyslipidemia, hypertension, obesity and a sedentary lifestyle.  Hypertension  This is a chronic problem. The current episode started more than 1 year ago. The problem is controlled. Pertinent negatives include no chest pain, headaches, palpitations or shortness of breath. Risk factors for coronary artery disease include dyslipidemia, diabetes mellitus, obesity and sedentary lifestyle. Past treatments include ACE inhibitors.    Review of Systems  Constitutional: Negative for unexpected weight change.  HENT: Negative for trouble swallowing and voice change.   Eyes: Negative for visual disturbance.  Respiratory: Negative for cough, shortness of breath and wheezing.   Cardiovascular: Negative for chest pain, palpitations and leg swelling.  Gastrointestinal: Negative for diarrhea, nausea and vomiting.  Endocrine: Negative for cold intolerance, heat intolerance, polydipsia, polyphagia and polyuria.  Musculoskeletal: Negative for arthralgias and myalgias.  Skin: Negative for color change, pallor, rash and wound.  Neurological: Negative for seizures and headaches.  Psychiatric/Behavioral: Negative for confusion and suicidal ideas.    Objective:    BP 127/84   Pulse 92   Ht 5' (1.524 m)   Wt 208 lb (94.3 kg)   BMI 40.62 kg/m   Wt Readings from Last 3 Encounters:  08/29/18 208 lb (94.3 kg)  06/25/18 212 lb 0.6 oz (96.2 kg)  04/04/18 222 lb (100.7 kg)    Physical Exam  Constitutional: She is oriented to person, place, and time. She appears well-developed.  HENT:  Head: Normocephalic and atraumatic.  Eyes: EOM are normal.  Neck: Normal range of motion. Neck supple. No tracheal deviation present. No thyromegaly present.  Cardiovascular:  Normal rate.  Abdominal: There is no tenderness. There is no guarding.  Musculoskeletal: Normal range of motion. She exhibits no edema.  Neurological: She is alert and oriented to person, place, and time. She has normal reflexes. No cranial nerve deficit. Coordination normal.  Skin: Skin is warm and dry. No rash noted. No erythema. No pallor.  Psychiatric: She has a normal mood and affect. Judgment normal.    Results for orders placed or performed in visit on 04/04/18  COMPLETE METABOLIC PANEL WITH GFR  Result Value Ref Range   Glucose, Bld 92 65 - 99 mg/dL   BUN 15 7 - 25 mg/dL   Creat 0.71 0.50 - 1.05 mg/dL   GFR, Est Non African American 94 > OR = 60 mL/min/1.19m2   GFR, Est African American 109 > OR = 60 mL/min/1.23m2   BUN/Creatinine Ratio NOT APPLICABLE 6 - 22 (calc)   Sodium 140 135 - 146 mmol/L   Potassium 3.6 3.5 - 5.3 mmol/L   Chloride 104 98 - 110 mmol/L   CO2 28 20 - 32 mmol/L   Calcium 9.2 8.6 - 10.4 mg/dL   Total Protein 6.1 6.1 - 8.1 g/dL   Albumin 4.1 3.6 - 5.1 g/dL   Globulin 2.0 1.9 - 3.7 g/dL (calc)   AG Ratio 2.1 1.0 - 2.5 (calc)   Total Bilirubin 0.5 0.2 - 1.2 mg/dL   Alkaline phosphatase (APISO) 103 33 - 130 U/L   AST 15 10 - 35 U/L   ALT 17 6 - 29 U/L  Hemoglobin A1c  Result Value Ref Range   Hgb A1c MFr Bld 5.7 (H) <5.7 % of total Hgb   Mean Plasma Glucose 117 (calc)   eAG (mmol/L) 6.5 (calc)  Microalbumin / creatinine urine ratio  Result Value Ref Range   Creatinine, Urine 185 20 - 275 mg/dL   Microalb, Ur 1.0 mg/dL   Microalb Creat Ratio 5 <30 mcg/mg creat   Diabetic Labs (most recent): Lab Results  Component Value Date   HGBA1C 5.7 (H) 08/27/2018   HGBA1C 6.9 (H) 03/28/2018   HGBA1C 6.9 (H) 12/04/2017   Lipid Panel     Component Value Date/Time   CHOL 208 (H) 11/29/2016 0729   TRIG 217 (H) 11/29/2016 0729   HDL 44 (L) 11/29/2016 0729   CHOLHDL 4.7 11/29/2016 0729   VLDL 43 (H) 11/29/2016 0729   LDLCALC 121 (H) 11/29/2016 0729      Assessment & Plan:   1. Uncontrolled type 2 diabetes mellitus with complication, with long-term current use of insulin ([place]) -She returns with  significant improvement in her glycemic profile and A1c of 5.7%, overall improving from 10+ percent.    - She  remains at a high risk for more acute and chronic complications of diabetes which include CAD, CVA, CKD, retinopathy, and neuropathy. These are all discussed in detail with the patient.  - Glucose logs and insulin administration records pertaining to this visit,  to be scanned into patient's records.  Recent labs reviewed.   - I have re-counseled the patient on diet management and weight loss  by adopting a carbohydrate restricted / protein rich  Diet.  -She made significant changes in her diet.  -  Suggestion is made for her to avoid simple carbohydrates  from her diet including Cakes, Sweet Desserts / Pastries, Ice Cream, Soda (diet and regular), Sweet Tea, Candies, Chips, Cookies, Store Bought Juices, Alcohol in Excess of  1-2 drinks a day, Artificial Sweeteners, and "Sugar-free" Products. This will help patient to have stable blood glucose profile and potentially avoid unintended weight gain.  - Patient is advised to stick to a routine mealtimes to eat 3 meals  a day and avoid unnecessary snacks ( to snack only to correct hypoglycemia).  - I have approached patient with the following individualized plan to manage diabetes and patient agrees.  -Based on her presentation with tightly controlled fasting blood glucose profile and A1c of 5.7%, she is advised to lower her Lantus to 50 units nightly, she would not need prandial insulin.   -She is advised to continue to monitor blood glucose at least once daily before breakfast, report if less than 70 or greater than 150 at fasting.    -She is benefiting from Trulicity therapy.  She is advised to continue Trulicity 1.5 mg subcutaneously weekly.     - Patient specific target  for A1c;  LDL, HDL, Triglycerides, and  Waist Circumference were discussed in detail.  2) BP/HTN: Her blood pressure is controlled to target.  She is advised to continue enalapril 10 mg p.o. daily.   3) Lipids/HPL:  LDL back at 121 from 87.  She did not tolerate statins including Crestor.  I discussed and added Repatha 140 mg subcutaneously every 14 days in hope of lowering her LDL to below 70. Insurance did not cover Lovaza.   4)  Weight/Diet: She has lost 14 pounds recently, exercise, and carbohydrates information provided. - She is not ready for bariatric Surgery.  5) Chronic Care/Health Maintenance:  -Patient is on ACEI/ARB and Statin medications and encouraged to continue to follow up with Ophthalmology, Podiatrist at least yearly or according to recommendations, and advised to  stay away from smoking. I have recommended yearly flu vaccine and pneumonia vaccination at least every 5 years; moderate intensity exercise for up to 150 minutes weekly; and  sleep for at least 7 hours a day.  - I advised patient to maintain close follow up with Mikey Kirschner, MD for primary care needs.  - Time spent with the patient: 25 min, of which >50% was spent in reviewing her blood glucose logs , discussing her hypo- and hyper-glycemic episodes, reviewing her current and  previous labs and insulin doses and developing a plan to avoid hypo- and hyper-glycemia. Please refer to Patient Instructions for Blood Glucose Monitoring and Insulin/Medications Dosing Guide"  in media tab for additional information. Mearl Latin Pelissier participated in the discussions, expressed understanding, and voiced agreement with the above plans.  All questions were answered to her satisfaction. she is encouraged to contact clinic should  she have any questions or concerns prior to her return visit.    Follow up plan: -Return in about 4 months (around 12/30/2018) for Follow up with Pre-visit Labs, Meter, and Logs.  Glade Lloyd, MD Phone:  [phone]  Fax: [phone]  -  This note was partially dictated with voice recognition software. Similar sounding words can be transcribed inadequately or may not  be corrected upon review.  08/29/2018, 4:36 PM

## 2018-08-29 NOTE — Patient Instructions (Signed)

## 2018-09-04 ENCOUNTER — Other Ambulatory Visit: Payer: Self-pay | Admitting: "Endocrinology

## 2018-09-25 DIAGNOSIS — Z1231 Encounter for screening mammogram for malignant neoplasm of breast: Secondary | ICD-10-CM | POA: Diagnosis not present

## 2018-09-25 DIAGNOSIS — Z6841 Body Mass Index (BMI) 40.0 and over, adult: Secondary | ICD-10-CM | POA: Diagnosis not present

## 2018-09-25 DIAGNOSIS — Z01419 Encounter for gynecological examination (general) (routine) without abnormal findings: Secondary | ICD-10-CM | POA: Diagnosis not present

## 2018-09-25 DIAGNOSIS — Z124 Encounter for screening for malignant neoplasm of cervix: Secondary | ICD-10-CM | POA: Diagnosis not present

## 2018-10-14 ENCOUNTER — Ambulatory Visit (INDEPENDENT_AMBULATORY_CARE_PROVIDER_SITE_OTHER): Payer: BLUE CROSS/BLUE SHIELD | Admitting: Orthopedic Surgery

## 2018-10-14 VITALS — BP 129/73 | HR 82 | Ht 60.0 in | Wt 208.0 lb

## 2018-10-14 DIAGNOSIS — M79644 Pain in right finger(s): Secondary | ICD-10-CM

## 2018-10-14 NOTE — Progress Notes (Signed)
Chief Complaint  Patient presents with  . Follow-up    Recheck on right thumb.    Right Trigger thumb injection Medication  1 mL of 40 mg Depo-Medrol  2 mL of 1% lidocaine plain  Ethyl chloride for anesthesia  Verbal consent was obtained timeout was taken to confirm the injection site as right thumb  Alcohol was used to prepare the skin along with ethyl chloride and then the injection was made at the A1 pulley there were no complications  Encounter Diagnosis  Name Primary?  . Thumb pain, right Yes

## 2018-10-22 ENCOUNTER — Telehealth: Payer: Self-pay | Admitting: "Endocrinology

## 2018-10-22 NOTE — Telephone Encounter (Signed)
Patient called and needs Lantas called into [facility] on [address] - call back # is [phone]

## 2018-10-23 MED ORDER — INSULIN GLARGINE 100 UNIT/ML SOLOSTAR PEN: 50.0000 [IU] | PEN_INJECTOR | Freq: Every day | SUBCUTANEOUS | 2 refills | Status: DC

## 2018-11-27 ENCOUNTER — Other Ambulatory Visit: Payer: Self-pay | Admitting: "Endocrinology

## 2018-12-09 ENCOUNTER — Other Ambulatory Visit: Payer: Self-pay | Admitting: "Endocrinology

## 2018-12-17 ENCOUNTER — Encounter: Payer: Self-pay | Admitting: Family Medicine

## 2018-12-17 ENCOUNTER — Ambulatory Visit: Payer: BLUE CROSS/BLUE SHIELD | Admitting: Family Medicine

## 2018-12-17 VITALS — BP 122/88 | Temp 98.7°F | Ht 60.0 in | Wt 220.0 lb

## 2018-12-17 DIAGNOSIS — J329 Chronic sinusitis, unspecified: Secondary | ICD-10-CM

## 2018-12-17 DIAGNOSIS — J310 Chronic rhinitis: Secondary | ICD-10-CM

## 2018-12-17 DIAGNOSIS — J111 Influenza due to unidentified influenza virus with other respiratory manifestations: Secondary | ICD-10-CM | POA: Diagnosis not present

## 2018-12-17 MED ORDER — CEFPROZIL 500 MG PO TABS: 500.0000 mg | ORAL_TABLET | Freq: Two times a day (BID) | ORAL | 0 refills | Status: DC

## 2018-12-17 MED ORDER — OSELTAMIVIR PHOSPHATE 75 MG PO CAPS: ORAL_CAPSULE | ORAL | 0 refills | Status: DC

## 2018-12-17 NOTE — Progress Notes (Signed)
Sudden on set severe cough , runny nose , head ache, felt fine couple days  Ago   Already moving into the chest    No  Major  dscomfor t  Pt did not get a gflu shot   thrao more sore  emnergy level low   appeite ok  trhroat feels irritated  Coughing off and on  Review of systems no vomiting no diarrhea no rash  Vital signs stable afebrile  Alert moderate nasal congestion.  Frontal tenderness pharynx normal neck supple moderate malaise.  No tachypnea intermittent bronchial cough no wheezes no crackles.  Heart regular rate and rhythm.  Impression probable flu.  With potential element of rhinosinusitis.  Discussed.  With underlying asthma and need to treat etiologies will cover with influenza antibiotic rationale discussed.  Symptom care discussed.  Warning signs discussed.

## 2018-12-30 ENCOUNTER — Ambulatory Visit: Payer: BLUE CROSS/BLUE SHIELD | Admitting: "Endocrinology

## 2019-01-01 ENCOUNTER — Telehealth: Payer: Self-pay | Admitting: Family Medicine

## 2019-01-01 ENCOUNTER — Other Ambulatory Visit: Payer: Self-pay | Admitting: Family Medicine

## 2019-01-01 MED ORDER — CEFPROZIL 500 MG PO TABS: ORAL_TABLET | ORAL | 0 refills | Status: DC

## 2019-01-01 NOTE — Telephone Encounter (Signed)
Pt contacted and medication sent in. Pt verbalized understanding.

## 2019-01-01 NOTE — Telephone Encounter (Signed)
Pt contacted for more information. Pt states her sore throat is gone but has cough. Pt states no fever, no trouble breathing. Pt states she is coughing up green mucus. Please advise. Thank you

## 2019-01-01 NOTE — Telephone Encounter (Signed)
Requesting another round of antibiotics seen 12/17/18 for sore throat and cough by Dr.Steve, pt states sore throat has went away but cough is still present. Advise.    Pharmacy:  [facility], [place]

## 2019-01-01 NOTE — Telephone Encounter (Signed)
cefzil 500 bid ten d 

## 2019-01-17 ENCOUNTER — Other Ambulatory Visit: Payer: Self-pay | Admitting: "Endocrinology

## 2019-01-21 ENCOUNTER — Ambulatory Visit: Payer: BLUE CROSS/BLUE SHIELD | Admitting: "Endocrinology

## 2019-01-28 ENCOUNTER — Other Ambulatory Visit: Payer: Self-pay | Admitting: Family Medicine

## 2019-01-28 DIAGNOSIS — E118 Type 2 diabetes mellitus with unspecified complications: Secondary | ICD-10-CM | POA: Diagnosis not present

## 2019-01-28 DIAGNOSIS — Z794 Long term (current) use of insulin: Secondary | ICD-10-CM | POA: Diagnosis not present

## 2019-01-28 DIAGNOSIS — E1165 Type 2 diabetes mellitus with hyperglycemia: Secondary | ICD-10-CM | POA: Diagnosis not present

## 2019-01-28 LAB — COMPLETE METABOLIC PANEL WITH GFR
AG Ratio: 1.9 (calc) (ref 1.0–2.5)
ALKALINE PHOSPHATASE (APISO): 102 U/L (ref 37–153)
ALT: 23 U/L (ref 6–29)
AST: 16 U/L (ref 10–35)
Albumin: 4.1 g/dL (ref 3.6–5.1)
BUN: 16 mg/dL (ref 7–25)
CALCIUM: 9.5 mg/dL (ref 8.6–10.4)
CO2: 29 mmol/L (ref 20–32)
CREATININE: 0.6 mg/dL (ref 0.50–1.05)
Chloride: 103 mmol/L (ref 98–110)
GFR, EST NON AFRICAN AMERICAN: 100 mL/min/{1.73_m2} (ref >=60)
GFR, Est African American: 116 mL/min/{1.73_m2} (ref >=60)
Globulin: 2.2 g/dL (calc) (ref 1.9–3.7)
Glucose, Bld: 148 mg/dL — ABNORMAL HIGH (ref 65–99)
Potassium: 3.5 mmol/L (ref 3.5–5.3)
Sodium: 141 mmol/L (ref 135–146)
Total Bilirubin: 0.4 mg/dL (ref 0.2–1.2)
Total Protein: 6.3 g/dL (ref 6.1–8.1)

## 2019-01-28 LAB — HEMOGLOBIN A1C
HEMOGLOBIN A1C: 6.9 %{Hb} — AB (ref <5.7)
Mean Plasma Glucose: 151 (calc)
eAG (mmol/L): 8.4 (calc)

## 2019-02-03 DIAGNOSIS — H2513 Age-related nuclear cataract, bilateral: Secondary | ICD-10-CM | POA: Diagnosis not present

## 2019-02-03 DIAGNOSIS — Z794 Long term (current) use of insulin: Secondary | ICD-10-CM | POA: Diagnosis not present

## 2019-02-03 DIAGNOSIS — H04123 Dry eye syndrome of bilateral lacrimal glands: Secondary | ICD-10-CM | POA: Diagnosis not present

## 2019-02-03 DIAGNOSIS — H524 Presbyopia: Secondary | ICD-10-CM | POA: Diagnosis not present

## 2019-02-03 DIAGNOSIS — H52202 Unspecified astigmatism, left eye: Secondary | ICD-10-CM | POA: Diagnosis not present

## 2019-02-03 DIAGNOSIS — E119 Type 2 diabetes mellitus without complications: Secondary | ICD-10-CM | POA: Diagnosis not present

## 2019-02-03 DIAGNOSIS — H5213 Myopia, bilateral: Secondary | ICD-10-CM | POA: Diagnosis not present

## 2019-02-03 LAB — HM DIABETES EYE EXAM

## 2019-02-04 ENCOUNTER — Ambulatory Visit (INDEPENDENT_AMBULATORY_CARE_PROVIDER_SITE_OTHER): Payer: BLUE CROSS/BLUE SHIELD | Admitting: "Endocrinology

## 2019-02-04 ENCOUNTER — Other Ambulatory Visit: Payer: Self-pay

## 2019-02-04 ENCOUNTER — Encounter: Payer: Self-pay | Admitting: "Endocrinology

## 2019-02-04 VITALS — BP 130/86 | HR 84 | Ht 60.0 in | Wt 220.0 lb

## 2019-02-04 DIAGNOSIS — E118 Type 2 diabetes mellitus with unspecified complications: Secondary | ICD-10-CM

## 2019-02-04 DIAGNOSIS — E1165 Type 2 diabetes mellitus with hyperglycemia: Secondary | ICD-10-CM

## 2019-02-04 DIAGNOSIS — E782 Mixed hyperlipidemia: Secondary | ICD-10-CM | POA: Diagnosis not present

## 2019-02-04 DIAGNOSIS — Z794 Long term (current) use of insulin: Secondary | ICD-10-CM

## 2019-02-04 DIAGNOSIS — IMO0002 Reserved for concepts with insufficient information to code with codable children: Secondary | ICD-10-CM

## 2019-02-04 DIAGNOSIS — I10 Essential (primary) hypertension: Secondary | ICD-10-CM

## 2019-02-04 NOTE — Patient Instructions (Signed)

## 2019-02-04 NOTE — Progress Notes (Signed)
Endocrinology follow-up note   Subjective:    Patient ID: Lisa Burns, female    DOB: May 29, 1961, PCP Mikey Kirschner, MD   Past Medical History:  Diagnosis Date  . Allergy   . Asthma   . Diabetes mellitus with neuropathy ([place])   . Diabetes mellitus without complication ([place])   . GERD (gastroesophageal reflux disease)   . Hypertension   . Microproteinuria   . Reflux   . Venous stasis    Past Surgical History:  Procedure Laterality Date  . CESAREAN SECTION    . COLONOSCOPY N/A 04/24/2016   Procedure: COLONOSCOPY;  Surgeon: Danie Binder, MD;  Location: [facility];  Service: Endoscopy;  Laterality: N/A;  8:30 Am  . LAPAROSCOPIC NISSEN FUNDOPLICATION    . OTHER SURGICAL HISTORY     gerd surgery  . THROAT SURGERY  2000   Social History   Socioeconomic History  . Marital status: Married    Spouse name: Not on file  . Number of children: 1  . Years of education: HS  . Highest education level: Not on file  Occupational History  . Occupation: [employer]   Social Needs  . Financial resource strain: Not on file  . Food insecurity:    Worry: Not on file    Inability: Not on file  . Transportation needs:    Medical: Not on file    Non-medical: Not on file  Tobacco Use  . Smoking status: Former Smoker    Packs/day: 1.00    Years: 5.00    Pack years: 5.00    Types: Cigarettes  . Smokeless tobacco: Never Used  Substance and Sexual Activity  . Alcohol use: Yes    Alcohol/week: 0.0 standard drinks    Comment: occasionally  . Drug use: No  . Sexual activity: Not on file  Lifestyle  . Physical activity:    Days per week: Not on file    Minutes per session: Not on file  . Stress: Not on file  Relationships  . Social connections:    Talks on phone: Not on file    Gets together: Not on file    Attends religious service: Not on file    Active member of club or organization: Not on file    Attends meetings of clubs or organizations: Not on file     Relationship status: Not on file  Other Topics Concern  . Not on file  Social History Narrative   Drinks 2 cans of soda a day    Outpatient Encounter Medications as of 02/04/2019  Medication Sig  . enalapril (VASOTEC) 10 MG tablet TAKE ONE (1) TABLET AT BEDTIME  . fluticasone (FLONASE) 50 MCG/ACT nasal spray Place 1 spray into both nostrils daily.  . fluticasone (FLOVENT HFA) 110 MCG/ACT inhaler INHALE TWO PUFFS INTO THE LUNGS TWICE DAILY  . furosemide (LASIX) 40 MG tablet Take 1 tablet (40 mg total) by mouth every morning.  . gabapentin (NEURONTIN) 400 MG capsule TAKE ONE (1) CAPSULE THREE (3) TIMES EACH DAY  . glucose blood test strip Use as instructed  . lansoprazole (PREVACID) 15 MG capsule Take 1 capsule (15 mg total) by mouth daily.  Marland Kitchen LANTUS SOLOSTAR 100 UNIT/ML Solostar Pen ADMINISTER 50 UNITS UNDER THE SKIN AT BEDTIME  . loratadine (CLARITIN) 10 MG tablet Take 10 mg by mouth. Take two everyday  . nortriptyline (PAMELOR) 50 MG capsule TAKE TWO CAPSULES BY MOUTH AT BEDTIME DAILY  . triamcinolone cream (KENALOG) 0.1 %  Apply 1 application topically 2 (two) times daily.  . TRULICITY 1.5 PP/2.9JJ SOPN INJECT ONE PEN INTO THE SKIN ONCE A WEEK  . valACYclovir (VALTREX) 500 MG tablet Take 500 mg by mouth daily as needed (cold sore flare ups).   . [DISCONTINUED] albuterol (PROVENTIL HFA;VENTOLIN HFA) 108 (90 Base) MCG/ACT inhaler Inhale 2 puffs into the lungs every 6 (six) hours as needed for wheezing or shortness of breath. (Patient not taking: Reported on 06/25/2018)  . [DISCONTINUED] cefdinir (OMNICEF) 300 MG capsule Take 1 capsule (300 mg total) by mouth 2 (two) times daily.  . [DISCONTINUED] cefPROZIL (CEFZIL) 500 MG tablet Take 1 tablet (500 mg total) by mouth 2 (two) times daily.  . [DISCONTINUED] cefPROZIL (CEFZIL) 500 MG tablet Take one tablet by mouth twice daily for 10 days  . [DISCONTINUED] clarithromycin (BIAXIN) 500 MG tablet Take 1 tablet (500 mg total) by mouth 2 (two) times  daily.  . [DISCONTINUED] oseltamivir (TAMIFLU) 75 MG capsule One p o bd for fiv d  . [DISCONTINUED] TRULICITY 1.5 OA/4.1YS SOPN INJECT ONE PEN INTO THE SKIN ONCE A WEEK   No facility-administered encounter medications on file as of 02/04/2019.    ALLERGIES: Allergies  Allergen Reactions  . Augmentin [Amoxicillin-Pot Clavulanate]     GI upset  . Januvia [Sitagliptin]   . Metformin And Related     GI upset  . Pravastatin     Pain in joints   VACCINATION STATUS: Immunization History  Administered Date(s) Administered  . Influenza, Seasonal, Injecte, Preservative Fre 08/09/2016  . Influenza,inj,Quad PF,6+ Mos 12/27/2017  . Pneumococcal Polysaccharide-23 12/30/2013    Diabetes  She presents for her follow-up diabetic visit. She has type 2 diabetes mellitus. Onset time: She was diagnosed at approximate age of 71 years. Her disease course has been stable. There are no hypoglycemic associated symptoms. Pertinent negatives for hypoglycemia include no confusion, headaches, pallor or seizures. There are no diabetic associated symptoms. Pertinent negatives for diabetes include no chest pain, no polydipsia, no polyphagia and no polyuria. There are no hypoglycemic complications. Symptoms are stable. There are no diabetic complications. Risk factors for coronary artery disease include diabetes mellitus, dyslipidemia, family history, hypertension, obesity, sedentary lifestyle and tobacco exposure. Current diabetic treatment includes insulin injections and oral agent (monotherapy). She is compliant with treatment most of the time. Her weight is decreasing steadily. She is following a generally unhealthy diet. When asked about meal planning, she reported none. She has not had a previous visit with a dietitian. She never participates in exercise. There is no change in her home blood glucose trend. Her breakfast blood glucose range is generally 140-180 mg/dl. Her overall blood glucose range is 140-180 mg/dl. An  ACE inhibitor/angiotensin II receptor blocker is being taken. Eye exam is current.  Hyperlipidemia  This is a chronic problem. The current episode started more than 1 year ago. Exacerbating diseases include diabetes and obesity. Pertinent negatives include no chest pain, myalgias or shortness of breath. She is currently on no antihyperlipidemic treatment. Risk factors for coronary artery disease include diabetes mellitus, dyslipidemia, hypertension, obesity and a sedentary lifestyle.  Hypertension  This is a chronic problem. The current episode started more than 1 year ago. The problem is controlled. Pertinent negatives include no chest pain, headaches, palpitations or shortness of breath. Risk factors for coronary artery disease include dyslipidemia, diabetes mellitus, obesity and sedentary lifestyle. Past treatments include ACE inhibitors.    Review of Systems  Constitutional: Negative for unexpected weight change.  HENT: Negative for  trouble swallowing and voice change.   Eyes: Negative for visual disturbance.  Respiratory: Negative for cough, shortness of breath and wheezing.   Cardiovascular: Negative for chest pain, palpitations and leg swelling.  Gastrointestinal: Negative for diarrhea, nausea and vomiting.  Endocrine: Negative for cold intolerance, heat intolerance, polydipsia, polyphagia and polyuria.  Musculoskeletal: Negative for arthralgias and myalgias.  Skin: Negative for color change, pallor, rash and wound.  Neurological: Negative for seizures and headaches.  Psychiatric/Behavioral: Negative for confusion and suicidal ideas.    Objective:    BP 130/86   Pulse 84   Ht 5' (1.524 m)   Wt 220 lb (99.8 kg)   BMI 42.97 kg/m   Wt Readings from Last 3 Encounters:  02/04/19 220 lb (99.8 kg)  12/17/18 220 lb (99.8 kg)  10/14/18 208 lb (94.3 kg)    Physical Exam  Constitutional: She is oriented to person, place, and time. She appears well-developed.  HENT:  Head:  Normocephalic and atraumatic.  Eyes: EOM are normal.  Neck: Normal range of motion. Neck supple. No tracheal deviation present. No thyromegaly present.  Cardiovascular: Normal rate.  Abdominal: There is no abdominal tenderness. There is no guarding.  Musculoskeletal: Normal range of motion.        General: No edema.  Neurological: She is alert and oriented to person, place, and time. She has normal reflexes. No cranial nerve deficit. Coordination normal.  Skin: Skin is warm and dry. No rash noted. No erythema. No pallor.  Psychiatric: She has a normal mood and affect. Judgment normal.    Results for orders placed or performed in visit on 08/29/18  Hemoglobin A1c  Result Value Ref Range   Hgb A1c MFr Bld 6.9 (H) <5.7 % of total Hgb   Mean Plasma Glucose 151 (calc)   eAG (mmol/L) 8.4 (calc)  COMPLETE METABOLIC PANEL WITH GFR  Result Value Ref Range   Glucose, Bld 148 (H) 65 - 99 mg/dL   BUN 16 7 - 25 mg/dL   Creat 0.60 0.50 - 1.05 mg/dL   GFR, Est Non African American 100 > OR = 60 mL/min/1.43m2   GFR, Est African American 116 > OR = 60 mL/min/1.71m2   BUN/Creatinine Ratio NOT APPLICABLE 6 - 22 (calc)   Sodium 141 135 - 146 mmol/L   Potassium 3.5 3.5 - 5.3 mmol/L   Chloride 103 98 - 110 mmol/L   CO2 29 20 - 32 mmol/L   Calcium 9.5 8.6 - 10.4 mg/dL   Total Protein 6.3 6.1 - 8.1 g/dL   Albumin 4.1 3.6 - 5.1 g/dL   Globulin 2.2 1.9 - 3.7 g/dL (calc)   AG Ratio 1.9 1.0 - 2.5 (calc)   Total Bilirubin 0.4 0.2 - 1.2 mg/dL   Alkaline phosphatase (APISO) 102 37 - 153 U/L   AST 16 10 - 35 U/L   ALT 23 6 - 29 U/L   Diabetic Labs (most recent): Lab Results  Component Value Date   HGBA1C 6.9 (H) 01/28/2019   HGBA1C 5.7 (H) 08/27/2018   HGBA1C 6.9 (H) 03/28/2018   Lipid Panel     Component Value Date/Time   CHOL 208 (H) 11/29/2016 0729   TRIG 217 (H) 11/29/2016 0729   HDL 44 (L) 11/29/2016 0729   CHOLHDL 4.7 11/29/2016 0729   VLDL 43 (H) 11/29/2016 0729   LDLCALC 121 (H)  11/29/2016 0729     Assessment & Plan:   1. Uncontrolled type 2 diabetes mellitus with complication, with long-term current use of  insulin ([place]) -She returns with stable glycemic profile and A1c of 6.9%, increasing from 5.7% during her last visit.  Overall she has improved her A1c from 10+ percent.    - She  remains at a high risk for more acute and chronic complications of diabetes which include CAD, CVA, CKD, retinopathy, and neuropathy. These are all discussed in detail with the patient.  - Glucose logs and insulin administration records pertaining to this visit,  to be scanned into patient's records.  Recent labs reviewed.   - I have re-counseled the patient on diet management and weight loss  by adopting a carbohydrate restricted / protein rich  Diet.  -She made significant changes in her diet.  - Patient admits there is a room for improvement in her diet and drink choices. -  Suggestion is made for her to avoid simple carbohydrates  from her diet including Cakes, Sweet Desserts / Pastries, Ice Cream, Soda (diet and regular), Sweet Tea, Candies, Chips, Cookies, Store Bought Juices, Alcohol in Excess of  1-2 drinks a day, Artificial Sweeteners, and "Sugar-free" Products. This will help patient to have stable blood glucose profile and potentially avoid unintended weight gain.   - Patient is advised to stick to a routine mealtimes to eat 3 meals  a day and avoid unnecessary snacks ( to snack only to correct hypoglycemia).  - I have approached patient with the following individualized plan to manage diabetes and patient agrees.  -Based on her presentation with controlled fasting blood glucose profile and A1c of 6.9%, she will not need prandial insulin for now.  She is advised to continue Lantus 50 units nightly, associated with strict monitoring of blood glucose at least once a day in the morning before breakfast, and at any other time as needed.    -She has tolerated and benefited from  Trulicity therapy.  She is advised to continue Trulicity 1.5 mg subcutaneously weekly.     - Patient specific target  for A1c; LDL, HDL, Triglycerides, and  Waist Circumference were discussed in detail.  2) BP/HTN: Her blood pressure is controlled to target.   She is advised to continue enalapril 10 mg p.o. daily.   3) Lipids/HPL:  LDL back at 121 from 87.  She did not tolerate statins including Crestor. Insurance did not cover Lovaza.   4)  Weight/Diet: She has gained 12 pounds recently, exercise, and carbohydrates information provided. - She is not ready for bariatric Surgery.  5) Chronic Care/Health Maintenance:  -Patient is on ACEI/ARB and Statin medications and encouraged to continue to follow up with Ophthalmology, Podiatrist at least yearly or according to recommendations, and advised to  stay away from smoking. I have recommended yearly flu vaccine and pneumonia vaccination at least every 5 years; moderate intensity exercise for up to 150 minutes weekly; and  sleep for at least 7 hours a day.  - I advised patient to maintain close follow up with Mikey Kirschner, MD for primary care needs.  - Time spent with the patient: 25 min, of which >50% was spent in reviewing her blood glucose logs , discussing her hypoglycemia and hyperglycemia episodes, reviewing her current and  previous labs / studies and medications  doses and developing a plan to avoid hypoglycemia and hyperglycemia. Please refer to Patient Instructions for Blood Glucose Monitoring and Insulin/Medications Dosing Guide"  in media tab for additional information. Please  also refer to " Patient Self Inventory" in the Media  tab for reviewed elements of pertinent patient  history.  Lisa Burns participated in the discussions, expressed understanding, and voiced agreement with the above plans.  All questions were answered to her satisfaction. she is encouraged to contact clinic should she have any questions or concerns prior to  her return visit.   Follow up plan: -Return in about 6 months (around 08/07/2019) for Meter, and Logs.  Glade Lloyd, MD Phone: [phone]  Fax: [phone]  -  This note was partially dictated with voice recognition software. Similar sounding words can be transcribed inadequately or may not  be corrected upon review.  02/04/2019, 4:39 PM

## 2019-02-11 DIAGNOSIS — L218 Other seborrheic dermatitis: Secondary | ICD-10-CM | POA: Diagnosis not present

## 2019-02-11 DIAGNOSIS — L718 Other rosacea: Secondary | ICD-10-CM | POA: Diagnosis not present

## 2019-02-11 DIAGNOSIS — L918 Other hypertrophic disorders of the skin: Secondary | ICD-10-CM | POA: Diagnosis not present

## 2019-03-07 ENCOUNTER — Other Ambulatory Visit: Payer: Self-pay | Admitting: "Endocrinology

## 2019-03-17 ENCOUNTER — Other Ambulatory Visit: Payer: Self-pay | Admitting: Family Medicine

## 2019-03-17 NOTE — Telephone Encounter (Signed)
May do all tines one after scheduling 6 movisit

## 2019-03-21 ENCOUNTER — Other Ambulatory Visit: Payer: Self-pay

## 2019-03-21 ENCOUNTER — Encounter: Payer: Self-pay | Admitting: Family Medicine

## 2019-03-21 ENCOUNTER — Ambulatory Visit (INDEPENDENT_AMBULATORY_CARE_PROVIDER_SITE_OTHER): Payer: BLUE CROSS/BLUE SHIELD | Admitting: Family Medicine

## 2019-03-21 DIAGNOSIS — I10 Essential (primary) hypertension: Secondary | ICD-10-CM

## 2019-03-21 DIAGNOSIS — E782 Mixed hyperlipidemia: Secondary | ICD-10-CM

## 2019-03-21 DIAGNOSIS — J4541 Moderate persistent asthma with (acute) exacerbation: Secondary | ICD-10-CM

## 2019-03-21 DIAGNOSIS — G609 Hereditary and idiopathic neuropathy, unspecified: Secondary | ICD-10-CM

## 2019-03-21 MED ORDER — FUROSEMIDE 40 MG PO TABS: 40.0000 mg | ORAL_TABLET | Freq: Every morning | ORAL | 5 refills | Status: DC

## 2019-03-21 MED ORDER — NORTRIPTYLINE HCL 50 MG PO CAPS: ORAL_CAPSULE | ORAL | 5 refills | Status: DC

## 2019-03-21 MED ORDER — ENALAPRIL MALEATE 10 MG PO TABS: ORAL_TABLET | ORAL | 5 refills | Status: DC

## 2019-03-21 MED ORDER — GABAPENTIN 400 MG PO CAPS: ORAL_CAPSULE | ORAL | 5 refills | Status: DC

## 2019-03-21 NOTE — Progress Notes (Signed)
   Subjective:  Patient presents for numerous concerns  Patient ID: Lisa Burns, female    DOB: [birth year], 58 y.o.   MRN: [id number] Format - video  Patient present at home Provider present at office Consent for interaction obtained Coronavirus outbreak made virtual visit necessary  Hypertension  This is a chronic problem. Treatments tried: enalapril, lasix. Compliance problems include diet (takes meds every day, tries to get in 5,000 steps in every day).    Sees Dr. Dorris Fetch for diabetes.   Virtual Visit via Video Note  I connected with Lisa Burns on 03/21/19 at  9:00 AM EDT by a video enabled telemedicine application and verified that I am speaking with the correct person using two identifiers.  Location: Patient: Provider:    I discussed the limitations of evaluation and management by telemedicine and the availability of in person appointments. The patient expressed understanding and agreed to proceed.  History of Present Illness:    Observations/Objective:   Assessment and Plan:   Follow Up Instructions:    I discussed the assessment and treatment plan with the patient. The patient was provided an opportunity to ask questions and all were answered. The patient agreed with the plan and demonstrated an understanding of the instructions.   The patient was advised to call back or seek an in-person evaluation if the symptoms worsen or if the condition fails to improve as anticipated.  I provided *63minutes of non-face-to-face time during this encounter.   Patient has ongoing challenges with neuropathy.  States the medication definitely helps.  Needs to remain on it.  Asthma overall fairly stable.  Occasional use of rescue inhaler.  Compliant with Flovent rather faithfully.  Review of Systems No headache, no major weight loss or weight gain, no chest pain no back pain abdominal pain no change in bowel habits complete ROS otherwise negative     Objective:   Physical  Exam  Virtual visit      Assessment & Plan:  Impression 1 hypertension apparent good control discussed to maintain same meds  2.  Asthma.  Moderate persistent.  With ongoing need for meds meds to maintain  3.  Painful neuropathy.  Discussed.  To maintain same meds  Diet exercise discussed.  General concerns discussed.  Greater than 50% of this 25 minute face to face visit was spent in counseling and discussion and coordination of care regarding the above diagnosis/diagnosies

## 2019-04-23 ENCOUNTER — Encounter: Payer: Self-pay | Admitting: Gastroenterology

## 2019-04-28 ENCOUNTER — Other Ambulatory Visit: Payer: Self-pay | Admitting: Family Medicine

## 2019-05-27 ENCOUNTER — Other Ambulatory Visit: Payer: Self-pay | Admitting: "Endocrinology

## 2019-06-23 ENCOUNTER — Other Ambulatory Visit: Payer: Self-pay

## 2019-06-23 ENCOUNTER — Ambulatory Visit (INDEPENDENT_AMBULATORY_CARE_PROVIDER_SITE_OTHER): Payer: BC Managed Care – PPO | Admitting: *Deleted

## 2019-06-23 DIAGNOSIS — Z86010 Personal history of colonic polyps: Secondary | ICD-10-CM

## 2019-06-23 MED ORDER — NA SULFATE-K SULFATE-MG SULF 17.5-3.13-1.6 GM/177ML PO SOLN: 1.0000 | Freq: Once | ORAL | 0 refills | Status: AC

## 2019-06-23 NOTE — Progress Notes (Addendum)
Gastroenterology Pre-Procedure Review  Request Date: 06/23/2019 Requesting Physician: 3 to 5 yr recall, Last TCS 04/24/2016 done by Dr. Oneida Alar, 3 polyps tubular adenoma, hyperplastic polyp  PATIENT REVIEW QUESTIONS: The patient responded to the following health history questions as indicated:    1. Diabetes Melitis: Yes 2. Joint replacements in the past 12 months: No 3. Major health problems in the past 3 months: No 4. Has an artificial valve or MVP: No 5. Has a defibrillator: No 6. Has been advised in past to take antibiotics in advance of a procedure like teeth cleaning: No 7. Family history of colon cancer: Yes, Grandmother Age: 60, Father Age: 78  8. Alcohol Use: Yes, 1 glass of wine a month 9. History of sleep apnea: No  10. History of coronary artery or other vascular stents placed within the last 12 months: No 11. History of any prior anesthesia complications: No    MEDICATIONS & ALLERGIES:    Patient reports the following regarding taking any blood thinners:   Plavix? No Aspirin? No Coumadin? No Brilinta? No Xarelto? No Eliquis? No Pradaxa? No Savaysa? No Effient? No  Patient confirms/reports the following medications:  Current Outpatient Medications  Medication Sig Dispense Refill  . enalapril (VASOTEC) 10 MG tablet TAKE ONE TABLET BY MOUTH AT BEDTIME 30 tablet 5  . fluticasone (FLONASE) 50 MCG/ACT nasal spray Place 1 spray into both nostrils daily. 16 g 5  . fluticasone (FLOVENT HFA) 110 MCG/ACT inhaler INHALE TWO PUFFS INTO THE LUNGS TWICE DAILY 12 g 5  . furosemide (LASIX) 40 MG tablet Take 1 tablet (40 mg total) by mouth every morning. 30 tablet 5  . gabapentin (NEURONTIN) 400 MG capsule TAKE ONE (1) CAPSULE THREE (3) TIMES EACH DAY (Patient taking differently: as needed. TAKE ONE (1) CAPSULE THREE (3) TIMES EACH DAY) 90 capsule 5  . glucose blood test strip Use as instructed 100 each 12  . lansoprazole (PREVACID) 15 MG capsule Take 1 capsule (15 mg total) by mouth  daily. 30 capsule 5  . LANTUS SOLOSTAR 100 UNIT/ML Solostar Pen ADMINISTER 50 UNITS UNDER THE SKIN AT BEDTIME 15 mL 2  . loratadine (CLARITIN) 10 MG tablet Take 10 mg by mouth. Take two everyday    . nortriptyline (PAMELOR) 50 MG capsule TAKE 2 CAPSULES BY MOUTH AT BEDTIME 60 capsule 5  . PROAIR HFA 108 (90 Base) MCG/ACT inhaler INHALE TWO PUFFS INTO THE LUNGS EVERY SIX HOURS AS NEEDED (Patient taking differently: as needed. ) 8.5 g 4  . triamcinolone cream (KENALOG) 0.1 % Apply 1 application topically 2 (two) times daily. 15 g 0  . TRULICITY 1.5 ZO/1.0RU SOPN INJECT ONE PEN INTO THE SKIN ONCE A WEEK 2 mL 2  . valACYclovir (VALTREX) 500 MG tablet Take 500 mg by mouth as needed (cold sore flare ups).      No current facility-administered medications for this visit.     Patient confirms/reports the following allergies:  Allergies  Allergen Reactions  . Augmentin [Amoxicillin-Pot Clavulanate]     GI upset  . Januvia [Sitagliptin]   . Metformin And Related     GI upset  . Pravastatin     Pain in joints    No orders of the defined types were placed in this encounter.   AUTHORIZATION INFORMATION Primary Insurance: [place],  [place] #: [id number],  Group #: [id number] Pre-Cert / Josem Kaufmann required: Not required/ Submit to local BCBS/No auth required per [facility] 07/17/2019 2:46   SCHEDULE INFORMATION: Procedure has been scheduled  as follows:  Date: 07/18/2019, Time: 12:00 Location: [facility] with Dr. Oneida Alar  This Gastroenterology Pre-Precedure Review Form is being routed to the following provider(s): Neil Crouch, PA-C

## 2019-06-23 NOTE — Patient Instructions (Addendum)
Lisa Burns  1961-04-11 MRN: [id number]     Procedure Date: 07/18/2019 Time to register: 12:00 pm Place to register: [facility] Procedure Time: 1:00 pm Scheduled provider: Dr. Oneida Alar    PREPARATION FOR COLONOSCOPY WITH SUPREP BOWEL PREP KIT  Note: Suprep Bowel Prep Kit is a split-dose (2day) regimen. Consumption of BOTH 6-ounce bottles is required for a complete prep.  Please notify us immediately if you are diabetic, take iron supplements, or if you are on Coumadin or any other blood thinners.  Please hold the following medications: see letter                                                                                                                                                   1 DAY BEFORE PROCEDURE:  DATE: 07/17/2019   DAY: Thursday Continue clear liquids the entire day - NO SOLID FOOD.   Diabetic medications adjustments for today: see letter  At 6:00pm: Complete steps 1 through 4 below, using ONE (1) 6-ounce bottle, before going to bed. Step 1:  Pour ONE (1) 6-ounce bottle of SUPREP liquid into the mixing container.  Step 2:  Add cool drinking water to the 16 ounce line on the container and mix.  Note: Dilute the solution concentrate as directed prior to use. Step 3:  DRINK ALL the liquid in the container. Step 4:  You MUST drink an additional two (2) or more 16 ounce containers of water over the next one (1) hour.   Continue clear liquids.  DAY OF PROCEDURE:   DATE: 07/18/2019 DAY: Friday If you take medications for your heart, blood pressure, or breathing, you may take these medications.  Diabetic medications adjustments for today: see letter  5 hours before your procedure at :  8:00 am Step 1:  Pour ONE (1) 6-ounce bottle of SUPREP liquid into the mixing container.  Step 2:  Add cool drinking water to the 16 ounce line on the container and mix.  Note: Dilute the solution concentrate as directed prior to use. Step 3:  DRINK ALL the liquid in the  container. Step 4:  You MUST drink an additional two (2) or more 16 ounce containers of water over the next one (1) hour. You MUST complete the final glass of water at least 3 hours before your colonoscopy. Nothing by mouth past 10:00 am  You may take your morning medications with sip of water unless we have instructed otherwise.    Please see below for Dietary Information.  CLEAR LIQUIDS INCLUDE:  Water Jello (NOT red in color)   Ice Popsicles (NOT red in color)   Tea (sugar ok, no milk/cream) Powdered fruit flavored drinks  Coffee (sugar ok, no milk/cream) Gatorade/ Lemonade/ Kool-Aid  (NOT red in color)   Juice: apple, white grape, white cranberry Soft drinks  Clear bullion,  consomme, broth (fat free beef/chicken/vegetable)  Carbonated beverages (any kind)  Strained chicken noodle soup Hard Candy   Remember: Clear liquids are liquids that will allow you to see your fingers on the other side of a clear glass. Be sure liquids are NOT red in color, and not cloudy, but CLEAR.  DO NOT EAT OR DRINK ANY OF THE FOLLOWING:  Dairy products of any kind   Cranberry juice Tomato juice / V8 juice   Grapefruit juice Orange juice     Red grape juice  Do not eat any solid foods, including such foods as: cereal, oatmeal, yogurt, fruits, vegetables, creamed soups, eggs, bread, crackers, pureed foods in a blender, etc.   HELPFUL HINTS FOR DRINKING PREP SOLUTION:   Make sure prep is extremely cold. Mix and refrigerate the the morning of the prep. You may also put in the freezer.   You may try mixing some Crystal Light or Country Time Lemonade if you prefer. Mix in small amounts; add more if necessary.  Try drinking through a straw  Rinse mouth with water or a mouthwash between glasses, to remove after-taste.  Try sipping on a cold beverage /ice/ popsicles between glasses of prep.  Place a piece of sugar-free hard candy in mouth between glasses.  If you become nauseated, try consuming smaller  amounts, or stretch out the time between glasses. Stop for 30-60 minutes, then slowly start back drinking.     OTHER INSTRUCTIONS  You will need a responsible adult at least 60 years of age to accompany you and drive you home. This person must remain in the waiting room during your procedure. The hospital will cancel your procedure if you do not have a responsible adult with you.   1. Wear loose fitting clothing that is easily removed. 2. Leave jewelry and other valuables at home.  3. Remove all body piercing jewelry and leave at home. 4. Total time from sign-in until discharge is approximately 2-3 hours. 5. You should go home directly after your procedure and rest. You can resume normal activities the day after your procedure. 6. The day of your procedure you should not:  Drive  Make legal decisions  Operate machinery  Drink alcohol  Return to work   You may call the office (Dept: [phone]) before 5:00pm, or page the doctor on call [phone]) after 5:00pm, for further instructions, if necessary.   Insurance Information YOU WILL NEED TO CHECK WITH YOUR INSURANCE COMPANY FOR THE BENEFITS OF COVERAGE YOU HAVE FOR THIS PROCEDURE.  UNFORTUNATELY, NOT ALL INSURANCE COMPANIES HAVE BENEFITS TO COVER ALL OR PART OF THESE TYPES OF PROCEDURES.  IT IS YOUR RESPONSIBILITY TO CHECK YOUR BENEFITS, HOWEVER, WE WILL BE GLAD TO ASSIST YOU WITH ANY CODES YOUR INSURANCE COMPANY MAY NEED.    PLEASE NOTE THAT MOST INSURANCE COMPANIES WILL NOT COVER A SCREENING COLONOSCOPY FOR PEOPLE UNDER THE AGE OF 50  IF YOU HAVE BCBS INSURANCE, YOU MAY HAVE BENEFITS FOR A SCREENING COLONOSCOPY BUT IF POLYPS ARE FOUND THE DIAGNOSIS WILL CHANGE AND THEN YOU MAY HAVE A DEDUCTIBLE THAT WILL NEED TO BE MET. SO PLEASE MAKE SURE YOU CHECK YOUR BENEFITS FOR A SCREENING COLONOSCOPY AS WELL AS A DIAGNOSTIC COLONOSCOPY.

## 2019-06-25 NOTE — Progress Notes (Signed)
Ok to schedule.  Day before TCS: lantus 1/2 dose (25 units at bedtime) AM of TCS: no Trulicity or Lantus

## 2019-06-26 ENCOUNTER — Encounter: Payer: Self-pay | Admitting: *Deleted

## 2019-06-26 NOTE — Addendum Note (Signed)
Addended by: Metro Kung on: 06/26/2019 01:46 PM   Modules accepted: Orders, SmartSet

## 2019-06-26 NOTE — Progress Notes (Signed)
Mailed letter to pt of how to adjust her diabetes medication.

## 2019-07-16 ENCOUNTER — Other Ambulatory Visit: Payer: Self-pay

## 2019-07-16 ENCOUNTER — Other Ambulatory Visit (HOSPITAL_COMMUNITY)
Admission: RE | Admit: 2019-07-16 | Discharge: 2019-07-16 | Disposition: A | Discharge status: Home / Self Care | Payer: BC Managed Care – PPO | Source: Ambulatory Visit | Attending: Gastroenterology | Admitting: Gastroenterology

## 2019-07-17 ENCOUNTER — Telehealth: Payer: Self-pay | Admitting: Gastroenterology

## 2019-07-17 ENCOUNTER — Other Ambulatory Visit (HOSPITAL_COMMUNITY)
Admission: RE | Admit: 2019-07-17 | Discharge: 2019-07-17 | Disposition: A | Discharge status: Home / Self Care | Payer: BC Managed Care – PPO | Source: Ambulatory Visit | Attending: Gastroenterology | Admitting: Gastroenterology

## 2019-07-17 ENCOUNTER — Other Ambulatory Visit: Payer: Self-pay

## 2019-07-17 ENCOUNTER — Telehealth: Payer: Self-pay

## 2019-07-17 DIAGNOSIS — Z01812 Encounter for preprocedural laboratory examination: Secondary | ICD-10-CM | POA: Diagnosis not present

## 2019-07-17 DIAGNOSIS — Z20828 Contact with and (suspected) exposure to other viral communicable diseases: Secondary | ICD-10-CM | POA: Diagnosis not present

## 2019-07-17 LAB — SARS CORONAVIRUS 2 (TAT 6-24 HRS): SARS Coronavirus 2: NEGATIVE

## 2019-07-17 NOTE — Telephone Encounter (Signed)
Spoke to [place] this morning in Day Surgery.  Informed her that pt called me yesterday and said that she was told that the testing site was closed on Wednesday.  Pt was informed to go back today (Thursday morning) at 8:00.  I followed up with pt this morning and she did go for her testing today.  Threasa Beards is going to follow up with testing site to make sure they were aware she was having a procedure and that the right test was performed.

## 2019-07-17 NOTE — Telephone Encounter (Signed)
Pt was returning a call from MB. Please call her back at [phone]

## 2019-07-17 NOTE — Telephone Encounter (Signed)
Melanie from Short Stay called to let us know pt didn't show up for Covid test yesterday and is scheduled with [facility] tomorrow.

## 2019-07-17 NOTE — Telephone Encounter (Signed)
Tried to call pt to see if she can arrive earlier tomorrow for TCS, no answer, LMOVM for return call.

## 2019-07-17 NOTE — Telephone Encounter (Signed)
Spoke to pt, TCS for tomorrow moved up to 8:30am. She will arrive at 7:30am. Advised her to start drinking 2nd half of prep at 3:30am tomorrow morning and NPO after 5:30am. Endo scheduler informed.

## 2019-07-17 NOTE — Telephone Encounter (Signed)
See other phone note for today. 

## 2019-07-18 ENCOUNTER — Ambulatory Visit (HOSPITAL_COMMUNITY)
Admission: RE | Admit: 2019-07-18 | Discharge: 2019-07-18 | Disposition: A | Discharge status: Home / Self Care | Payer: BC Managed Care – PPO | Attending: Gastroenterology | Admitting: Gastroenterology

## 2019-07-18 ENCOUNTER — Encounter (HOSPITAL_COMMUNITY): Payer: Self-pay | Admitting: *Deleted

## 2019-07-18 ENCOUNTER — Encounter (HOSPITAL_COMMUNITY)
Admission: RE | Disposition: A | Discharge status: Home / Self Care | Payer: Self-pay | Source: Home / Self Care | Attending: Gastroenterology

## 2019-07-18 ENCOUNTER — Other Ambulatory Visit: Payer: Self-pay

## 2019-07-18 DIAGNOSIS — I10 Essential (primary) hypertension: Secondary | ICD-10-CM | POA: Insufficient documentation

## 2019-07-18 DIAGNOSIS — K648 Other hemorrhoids: Secondary | ICD-10-CM | POA: Insufficient documentation

## 2019-07-18 DIAGNOSIS — K644 Residual hemorrhoidal skin tags: Secondary | ICD-10-CM | POA: Diagnosis not present

## 2019-07-18 DIAGNOSIS — Z794 Long term (current) use of insulin: Secondary | ICD-10-CM | POA: Insufficient documentation

## 2019-07-18 DIAGNOSIS — Z860100 Personal history of colon polyps, unspecified: Secondary | ICD-10-CM

## 2019-07-18 DIAGNOSIS — Z79899 Other long term (current) drug therapy: Secondary | ICD-10-CM | POA: Diagnosis not present

## 2019-07-18 DIAGNOSIS — E1140 Type 2 diabetes mellitus with diabetic neuropathy, unspecified: Secondary | ICD-10-CM | POA: Diagnosis not present

## 2019-07-18 DIAGNOSIS — Z1211 Encounter for screening for malignant neoplasm of colon: Secondary | ICD-10-CM | POA: Insufficient documentation

## 2019-07-18 DIAGNOSIS — Q438 Other specified congenital malformations of intestine: Secondary | ICD-10-CM | POA: Insufficient documentation

## 2019-07-18 DIAGNOSIS — D123 Benign neoplasm of transverse colon: Secondary | ICD-10-CM | POA: Insufficient documentation

## 2019-07-18 DIAGNOSIS — K219 Gastro-esophageal reflux disease without esophagitis: Secondary | ICD-10-CM | POA: Diagnosis not present

## 2019-07-18 DIAGNOSIS — D124 Benign neoplasm of descending colon: Secondary | ICD-10-CM | POA: Insufficient documentation

## 2019-07-18 DIAGNOSIS — Z87891 Personal history of nicotine dependence: Secondary | ICD-10-CM | POA: Diagnosis not present

## 2019-07-18 DIAGNOSIS — K635 Polyp of colon: Secondary | ICD-10-CM | POA: Diagnosis not present

## 2019-07-18 DIAGNOSIS — J45909 Unspecified asthma, uncomplicated: Secondary | ICD-10-CM | POA: Insufficient documentation

## 2019-07-18 HISTORY — PX: COLONOSCOPY: SHX5424

## 2019-07-18 HISTORY — PX: POLYPECTOMY: SHX5525

## 2019-07-18 LAB — GLUCOSE, CAPILLARY: Glucose-Capillary: 207 mg/dL — ABNORMAL HIGH (ref 70–99)

## 2019-07-18 SURGERY — COLONOSCOPY
Anesthesia: Moderate Sedation

## 2019-07-18 MED ORDER — MEPERIDINE HCL 100 MG/ML IJ SOLN
INTRAMUSCULAR | Status: AC
  Filled 2019-07-18: qty 2

## 2019-07-18 MED ORDER — SODIUM CHLORIDE 0.9 % IV SOLN
INTRAVENOUS | Status: DC
  Administered 2019-07-18: 08:00:00 via INTRAVENOUS

## 2019-07-18 MED ORDER — MIDAZOLAM HCL 5 MG/5ML IJ SOLN
INTRAMUSCULAR | Status: DC | PRN
  Administered 2019-07-18 (×2): 2 mg via INTRAVENOUS

## 2019-07-18 MED ORDER — MIDAZOLAM HCL 5 MG/5ML IJ SOLN
INTRAMUSCULAR | Status: AC
  Filled 2019-07-18: qty 10

## 2019-07-18 MED ORDER — MEPERIDINE HCL 100 MG/ML IJ SOLN
INTRAMUSCULAR | Status: DC | PRN
  Administered 2019-07-18: 50 mg
  Administered 2019-07-18: 25 mg

## 2019-07-18 MED ORDER — STERILE WATER FOR IRRIGATION IR SOLN
Status: DC | PRN
  Administered 2019-07-18: 5 mL

## 2019-07-18 NOTE — H&P (Signed)
Primary Care Physician:  Mikey Kirschner, MD Primary Gastroenterologist:  Dr. Oneida Alar  Pre-Procedure History & Physical: HPI:  Lisa Burns is a 58 y.o. female here for  PERSONAL HISTORY OF POLYPS.  Past Medical History:  Diagnosis Date  . Allergy   . Asthma   . Diabetes mellitus with neuropathy ([place])   . Diabetes mellitus without complication ([place])   . GERD (gastroesophageal reflux disease)   . Hypertension   . Microproteinuria   . Reflux   . Venous stasis     Past Surgical History:  Procedure Laterality Date  . CESAREAN SECTION    . COLONOSCOPY N/A 04/24/2016   Procedure: COLONOSCOPY;  Surgeon: Danie Binder, MD;  Location: [facility];  Service: Endoscopy;  Laterality: N/A;  8:30 Am  . LAPAROSCOPIC NISSEN FUNDOPLICATION    . OTHER SURGICAL HISTORY     gerd surgery  . THROAT SURGERY  2000    Prior to Admission medications   Medication Sig Start Date End Date Taking? Authorizing Provider  Azelaic Acid 15 % cream Apply 1 application topically daily. Apply to face 02/14/19  Yes [provider]  clobetasol (TEMOVATE) 0.05 % external solution Apply 1 application topically 2 (two) times daily. 02/12/19  Yes [provider]  enalapril (VASOTEC) 10 MG tablet TAKE ONE TABLET BY MOUTH AT BEDTIME Patient taking differently: Take 10 mg by mouth at bedtime.  03/21/19  Yes Mikey Kirschner, MD  fluticasone (FLONASE) 50 MCG/ACT nasal spray Place 1 spray into both nostrils daily. Patient taking differently: Place 1 spray into both nostrils daily as needed for allergies.  12/27/17  Yes Mikey Kirschner, MD  fluticasone (FLOVENT HFA) 110 MCG/ACT inhaler INHALE TWO PUFFS INTO THE LUNGS TWICE DAILY Patient taking differently: Inhale 3 puffs into the lungs 2 (two) times daily as needed (shortness of breath).  01/28/19  Yes Mikey Kirschner, MD  furosemide (LASIX) 40 MG tablet Take 1 tablet (40 mg total) by mouth every morning. 03/21/19  Yes Mikey Kirschner, MD  gabapentin  (NEURONTIN) 400 MG capsule TAKE ONE (1) CAPSULE THREE (3) [place] Patient taking differently: Take 400 mg by mouth 3 (three) times daily as needed (pain).  03/21/19  Yes Mikey Kirschner, MD  ibuprofen (ADVIL) 200 MG tablet Take 800 mg by mouth every 6 (six) hours as needed for headache or moderate pain.   Yes [provider]  lansoprazole (PREVACID) 15 MG capsule Take 1 capsule (15 mg total) by mouth daily. 12/27/17  Yes Mikey Kirschner, MD  LANTUS SOLOSTAR 100 UNIT/ML Solostar Pen ADMINISTER 50 UNITS UNDER THE SKIN AT BEDTIME Patient taking differently: Inject 50 Units into the skin at bedtime.  05/28/19  Yes Nida, Marella Chimes, MD  loratadine (CLARITIN) 10 MG tablet Take 10 mg by mouth daily.    Yes [provider]  nortriptyline (PAMELOR) 50 MG capsule TAKE 2 CAPSULES BY MOUTH AT BEDTIME Patient taking differently: Take 100 mg by mouth at bedtime.  03/21/19  Yes Mikey Kirschner, MD  PROAIR HFA 108 [phone] Base) MCG/ACT inhaler INHALE TWO PUFFS INTO THE LUNGS EVERY SIX HOURS AS NEEDED Patient taking differently: Inhale 2 puffs into the lungs every 6 (six) hours as needed for wheezing or shortness of breath.  04/29/19  Yes Mikey Kirschner, MD  triamcinolone cream (KENALOG) 0.1 % Apply 1 application topically 2 (two) times daily. Patient taking differently: Apply 1 application topically 2 (two) times daily as needed (rosacea).  08/09/16  Yes Mikey Kirschner, MD  TRULICITY 1.5 0000000 SOPN INJECT ONE PEN INTO THE SKIN ONCE A WEEK Patient taking differently: Inject 1.5 mg as directed every Monday.  01/22/19  Yes Nida, Marella Chimes, MD  valACYclovir (VALTREX) 500 MG tablet Take 500 mg by mouth 2 (two) times daily as needed (cold sore flare ups).  03/05/13  Yes [provider]  glucose blood test strip Use as instructed 02/26/13   Mikey Kirschner, MD    Allergies as of 06/26/2019 - Review Complete 06/23/2019  Allergen Reaction Noted  . Augmentin [amoxicillin-pot  clavulanate]  02/26/2013  . Januvia [sitagliptin]  11/04/2015  . Metformin and related  04/29/2013  . Pravastatin  12/27/2017    Family History  Problem Relation Age of Onset  . Diabetes Mother   . Heart disease Father   . Cancer Father        lung  . Colon cancer Father   . Heart disease Sister   . Diabetes Sister   . Heart disease Brother   . Diabetes Brother   . Colon cancer Paternal Grandmother     Social History   Socioeconomic History  . Marital status: Married    Spouse name: Not on file  . Number of children: 1  . Years of education: HS  . Highest education level: Not on file  Occupational History  . Occupation: [employer]   Social Needs  . Financial resource strain: Not on file  . Food insecurity    Worry: Not on file    Inability: Not on file  . Transportation needs    Medical: Not on file    Non-medical: Not on file  Tobacco Use  . Smoking status: Former Smoker    Packs/day: 1.00    Years: 5.00    Pack years: 5.00    Types: Cigarettes  . Smokeless tobacco: Never Used  Substance and Sexual Activity  . Alcohol use: Yes    Alcohol/week: 0.0 standard drinks    Comment: occasionally  . Drug use: No  . Sexual activity: Not on file  Lifestyle  . Physical activity    Days per week: Not on file    Minutes per session: Not on file  . Stress: Not on file  Relationships  . Social Herbalist on phone: Not on file    Gets together: Not on file    Attends religious service: Not on file    Active member of club or organization: Not on file    Attends meetings of clubs or organizations: Not on file    Relationship status: Not on file  . Intimate partner violence    Fear of current or ex partner: Not on file    Emotionally abused: Not on file    Physically abused: Not on file    Forced sexual activity: Not on file  Other Topics Concern  . Not on file  Social History Narrative   Drinks 2 cans of soda a day     Review of Systems: See  HPI, otherwise negative ROS   Physical Exam: BP (!) 149/82   Pulse 79   Temp 98.6 F (37 C) (Oral)   Resp 20   Ht 5' (1.524 m)   Wt 99.8 kg   SpO2 99%   BMI 42.97 kg/m  General:   Alert,  pleasant and cooperative in NAD Head:  Normocephalic and atraumatic. Neck:  Supple; Lungs:  Clear throughout to auscultation.  Heart:  Regular rate and rhythm. Abdomen:  Soft, nontender and nondistended. Normal bowel sounds, without guarding, and without rebound.   Neurologic:  Alert and  oriented x4;  grossly normal neurologically.  Impression/Plan:      PERSONAL HISTORY OF POLYPS.  PLAN: 1. TCS TODAY. DISCUSSED PROCEDURE, BENEFITS, & RISKS: < 1% chance of medication reaction, bleeding, perforation, ASPIRATION, or rupture of spleen/liver requiring surgery to fix it and missed polyps < 1 cm 10-20% of the time.

## 2019-07-18 NOTE — Op Note (Signed)
[facility] Patient Name: Lisa Burns Procedure Date: 07/18/2019 8:08 AM MRN: [id number] Date of Birth: 25-Nov-1960 Attending MD: Barney Drain MD, MD CSN: [id number] Age: 58 Admit Type: Outpatient Procedure:                Colonoscopy with COLD SNARE POLYPECTOMY Indications:              Personal history of colonic polyps Providers:                Barney Drain MD, MD, Janeece Riggers, RN, Randa Spike, Technician Referring MD:             Grace Bushy. [place] Medicines:                Meperidine 75 mg IV, Midazolam 4 mg IV Complications:            No immediate complications. Estimated Blood Loss:     Estimated blood loss was minimal. Procedure:                Pre-Anesthesia Assessment:                           - Prior to the procedure, a History and Physical                            was performed, and patient medications and                            allergies were reviewed. The patient's tolerance of                            previous anesthesia was also reviewed. The risks                            and benefits of the procedure and the sedation                            options and risks were discussed with the patient.                            All questions were answered, and informed consent                            was obtained. Prior Anticoagulants: The patient has                            taken no previous anticoagulant or antiplatelet                            agents except for NSAID medication. ASA Grade                            Assessment: II - A patient with mild systemic  disease. After reviewing the risks and benefits,                            the patient was deemed in satisfactory condition to                            undergo the procedure. After obtaining informed                            consent, the colonoscope was passed under direct                            vision. Throughout the  procedure, the patient's                            blood pressure, pulse, and oxygen saturations were                            monitored continuously. The [id number]                            scope was introduced through the anus and advanced                            to the the cecum, identified by appendiceal orifice                            and ileocecal valve. The colonoscopy was somewhat                            difficult due to a tortuous colon. Successful                            completion of the procedure was aided by                            straightening and shortening the scope to obtain                            bowel loop reduction and COLOWRAP. The patient                            tolerated the procedure well. The quality of the                            bowel preparation was good. The ileocecal valve,                            appendiceal orifice, and rectum were photographed. Scope In: 8:57:04 AM Scope Out: 9:15:16 AM Scope Withdrawal Time: 0 hours 16 minutes 20 seconds  Total Procedure Duration: 0 hours 18 minutes 12 seconds  Findings:      Six sessile polyps were found in the proximal descending colon(2), mid  transverse colon(3) and hepatic flexure. The polyps were 3 to 5 mm in       size. These polyps were removed with a cold snare. Resection and       retrieval were complete.      External and internal hemorrhoids were found.      The recto-sigmoid colon and sigmoid colon were mildly tortuous. Impression:               - Six 3 to 5 mm polyps in the proximal descending                            colon, in the mid transverse colon and at the                            hepatic flexure, removed with a cold snare.                            Resected and retrieved.                           - External and internal hemorrhoids.                           - Tortuous colon. Moderate Sedation:      Moderate (conscious) sedation was  administered by the endoscopy nurse       and supervised by the endoscopist. The following parameters were       monitored: oxygen saturation, heart rate, blood pressure, and response       to care. Total physician intraservice time was 29 minutes. Recommendation:           - Patient has a contact number available for                            emergencies. The signs and symptoms of potential                            delayed complications were discussed with the                            patient. Return to normal activities tomorrow.                            Written discharge instructions were provided to the                            patient.                           - High fiber diet.                           - Continue present medications.                           - Await pathology results.                           -  Repeat colonoscopy in 3 years for surveillance. Procedure Code(s):        --- Professional ---                           [phone], Colonoscopy, flexible; with removal of                            tumor(s), polyp(s), or other lesion(s) by snare                            technique                           99153, Moderate sedation; each additional 15                            minutes intraservice time                           G0500, Moderate sedation services provided by the                            same physician or other qualified health care                            professional performing a gastrointestinal                            endoscopic service that sedation supports,                            requiring the presence of an independent trained                            observer to assist in the monitoring of the                            patient's level of consciousness and physiological                            status; initial 15 minutes of intra-service time;                            patient age 68 years or older (additional time may                             be reported with [phone], as appropriate) Diagnosis Code(s):        --- Professional ---                           K63.5, Polyp of colon                           K64.8, Other hemorrhoids  Z86.010, Personal history of colonic polyps                           Q43.8, Other specified congenital malformations of                            intestine CPT copyright 2019 American Medical Association. All rights reserved. The codes documented in this report are preliminary and upon coder review may  be revised to meet current compliance requirements. Barney Drain, MD Barney Drain MD, MD 07/18/2019 9:34:25 AM This report has been signed electronically. Number of Addenda: 0

## 2019-07-18 NOTE — Progress Notes (Signed)
cc'd to pcp 

## 2019-07-18 NOTE — Discharge Instructions (Signed)
You had 6 polyps removed. You have SMALL internal AND EXTERNAL hemorrhoids.   EAT TO LIVE AND THINK OF FOOD AS MEDICINE.  DRINK WATER TO KEEP YOUR URINE LIGHT YELLOW.  To have more energy and to lose weight:      1. FOLLOW HIGH FIBER DIET. CONTINUE YOUR WEIGHT LOSS EFFORTS. I RECOMMEND YOU READ AND FOLLOW RECOMMENDATIONS BY DR. MARK HYMAN, "10-DAY DETOX DIET".  YOUR BODY MASS INDEX (BMI) IS OVER 40 WHICH MEANS YOU ARE MORBIDLY OBESE. OBESITY ACTIVATES CANCER GENES. MORBID OBESITY SHORTENS YOUR LIFE EXPECTANCY 10 YEARS. OBESITY IS ASSOCIATED WITH AN INCREASE RISK FOR CIRRHOSIS, COLON POLYPS, AND ALL CANCERS, INCLUDING ESOPHAGEAL AND COLON CANCER.      2. If you must eat bread, EAT EZEKIEL BREAD. IT IS IN THE FROZEN SECTION OF THE GROCERY STORE.    3. Do not drink SODA, GATORADE, ENERGY DRINKS, OR DIET SODA.     4. AVOID HIGH FRUCTOSE CORN SYRUP.     5. DO NOT chew SUGAR FREE GUM OR USE ARTIFICIAL SWEETENERS. USE STEVIA AS A SWEETENER.    6. DO NOT EAT ENRICHED WHEAT FLOUR, PASTA, RICE, OR CEREAL.    7. ONLY EAT WILD CAUGHT SEAFOOD, GRASS FED BEEF OR CHICKEN, OR EGGS FROM PASTURE RAISED CHICKENS.    8. PRACTICE CHAIR YOGA FOR 15-30 MINS 3 OR 4 TIMES A WEEK AND PROGRESS TO HATHA YOGA OVER NEXT 6 MOS.    9. ADD A MULTIVITAMIN, VITAMIN B12, AND VITAMIN D3 2000 IU DAILY.   USE PREPARATION H FOUR TIMES  A DAY IF NEEDED TO RELIEVE RECTAL PAIN/PRESSURE/BLEEDING.   YOUR BIOPSY RESULTS WILL BE BACK IN 5 BUSINESS DAYS.  Next colonoscopy in 3 years.    Colonoscopy Care After Read the instructions outlined below and refer to this sheet in the next week. These discharge instructions provide you with general information on caring for yourself after you leave the hospital. While your treatment has been planned according to the most current medical practices available, unavoidable complications occasionally occur. If you have any problems or questions after discharge, call DR. Jadarius Commons,  [phone].  ACTIVITY  You may resume your regular activity, but move at a slower pace for the next 24 hours.   Take frequent rest periods for the next 24 hours.   Walking will help get rid of the air and reduce the bloated feeling in your belly (abdomen).   No driving for 24 hours (because of the medicine (anesthesia) used during the test).   You may shower.   Do not sign any important legal documents or operate any machinery for 24 hours (because of the anesthesia used during the test).    NUTRITION  Drink plenty of fluids.   You may resume your normal diet as instructed by your doctor.   Begin with a light meal and progress to your normal diet. Heavy or fried foods are harder to digest and may make you feel sick to your stomach (nauseated).   Avoid alcoholic beverages for 24 hours or as instructed.    MEDICATIONS  You may resume your normal medications.   WHAT YOU CAN EXPECT TODAY  Some feelings of bloating in the abdomen.   Passage of more gas than usual.   Spotting of blood in your stool or on the toilet paper  .  IF YOU HAD POLYPS REMOVED DURING THE COLONOSCOPY:  Eat a soft diet IF YOU HAVE NAUSEA, BLOATING, ABDOMINAL PAIN, OR VOMITING.    FINDING OUT THE RESULTS OF  YOUR TEST Not all test results are available during your visit. DR. [place] Alar WILL CALL YOU WITHIN 14 DAYS OF YOUR PROCEDUE WITH YOUR RESULTS. Do not assume everything is normal if you have not heard from DR. Vance Hochmuth, CALL HER OFFICE AT [phone].  SEEK IMMEDIATE MEDICAL ATTENTION AND CALL THE OFFICE: [phone] IF:  You have more than a spotting of blood in your stool.   Your belly is swollen (abdominal distention).   You are nauseated or vomiting.   You have a temperature over 101F.   You have abdominal pain or discomfort that is severe or gets worse throughout the day.   High-Fiber Diet A high-fiber diet changes your normal diet to include more whole grains, legumes, fruits, and  vegetables. Changes in the diet involve replacing refined carbohydrates with unrefined foods. The calorie level of the diet is essentially unchanged. The Dietary Reference Intake (recommended amount) for adult males is 38 grams per day. For adult females, it is 25 grams per day. Pregnant and lactating women should consume 28 grams of fiber per day. Fiber is the intact part of a plant that is not broken down during digestion. Functional fiber is fiber that has been isolated from the plant to provide a beneficial effect in the body.  PURPOSE  Increase stool bulk.   Ease and regulate bowel movements.   Lower cholesterol.   REDUCE RISK OF COLON CANCER  INDICATIONS THAT YOU NEED MORE FIBER  Constipation and hemorrhoids.   Uncomplicated diverticulosis (intestine condition) and irritable bowel syndrome.   Weight management.   As a protective measure against hardening of the arteries (atherosclerosis), diabetes, and cancer.   GUIDELINES FOR INCREASING FIBER IN THE DIET  Start adding fiber to the diet slowly. A gradual increase of about 5 more grams (2 servings of most fruits or vegetables) per day is best. Too rapid an increase in fiber may result in constipation, flatulence, and bloating.   Drink enough water and fluids to keep your urine clear or pale yellow. Water, juice, or caffeine-free drinks are recommended. Not drinking enough fluid may cause constipation.   Eat a variety of high-fiber foods rather than one type of fiber.   Try to increase your intake of fiber through using high-fiber foods rather than fiber pills or supplements that contain small amounts of fiber.   The goal is to change the types of food eaten. Do not supplement your present diet with high-fiber foods, but replace foods in your present diet.    Polyps, Colon  A polyp is extra tissue that grows inside your body. Colon polyps grow in the large intestine. The large intestine, also called the colon, is part of your  digestive system. It is a long, hollow tube at the end of your digestive tract where your body makes and stores stool. Most polyps are not dangerous. They are benign. This means they are not cancerous. But over time, some types of polyps can turn into cancer. Polyps that are smaller than a pea are usually not harmful. But larger polyps could someday become or may already be cancerous. To be safe, doctors remove all polyps and test them.   WHO GETS POLYPS? Anyone can get polyps, but certain people are more likely than others. You may have a greater chance of getting polyps if:  You are over 50.   You have had polyps before.   Someone in your family has had polyps.   Someone in your family has had cancer of the large  intestine.   Find out if someone in your family has had polyps. You may also be more likely to get polyps if you:   Eat a lot of fatty foods   Smoke   Drink alcohol   Do not exercise  Eat too much   PREVENTION There is not one sure way to prevent polyps. You might be able to lower your risk of getting them if you:  Eat more fruits and vegetables and less fatty food.   Do not smoke.   Avoid alcohol.   Exercise every day.   Lose weight if you are overweight.   Eating more calcium and folate can also lower your risk of getting polyps. Some foods that are rich in calcium are milk, cheese, and broccoli. Some foods that are rich in folate are chickpeas, kidney beans, and spinach.

## 2019-07-21 NOTE — Progress Notes (Signed)
ON RECALL AND CC'D TO PCP °

## 2019-07-22 ENCOUNTER — Encounter (HOSPITAL_COMMUNITY): Payer: Self-pay | Admitting: Gastroenterology

## 2019-07-30 ENCOUNTER — Other Ambulatory Visit: Payer: Self-pay | Admitting: "Endocrinology

## 2019-08-05 DIAGNOSIS — Z794 Long term (current) use of insulin: Secondary | ICD-10-CM | POA: Diagnosis not present

## 2019-08-05 DIAGNOSIS — E1165 Type 2 diabetes mellitus with hyperglycemia: Secondary | ICD-10-CM | POA: Diagnosis not present

## 2019-08-05 DIAGNOSIS — E118 Type 2 diabetes mellitus with unspecified complications: Secondary | ICD-10-CM | POA: Diagnosis not present

## 2019-08-06 LAB — COMPLETE METABOLIC PANEL WITH GFR
AG Ratio: 1.9 (calc) (ref 1.0–2.5)
ALT: 25 U/L (ref 6–29)
AST: 17 U/L (ref 10–35)
Albumin: 4.1 g/dL (ref 3.6–5.1)
Alkaline phosphatase (APISO): 94 U/L (ref 37–153)
BUN: 16 mg/dL (ref 7–25)
CO2: 29 mmol/L (ref 20–32)
Calcium: 9.4 mg/dL (ref 8.6–10.4)
Chloride: 103 mmol/L (ref 98–110)
Creat: 0.84 mg/dL (ref 0.50–1.05)
GFR, Est African American: 88 mL/min/{1.73_m2} (ref >=60)
GFR, Est Non African American: 76 mL/min/{1.73_m2} (ref >=60)
Globulin: 2.2 g/dL (calc) (ref 1.9–3.7)
Glucose, Bld: 200 mg/dL — ABNORMAL HIGH (ref 65–99)
Potassium: 3.4 mmol/L — ABNORMAL LOW (ref 3.5–5.3)
Sodium: 141 mmol/L (ref 135–146)
Total Bilirubin: 0.4 mg/dL (ref 0.2–1.2)
Total Protein: 6.3 g/dL (ref 6.1–8.1)

## 2019-08-06 LAB — HEMOGLOBIN A1C
Hgb A1c MFr Bld: 8.3 % of total Hgb — ABNORMAL HIGH (ref <5.7)
Mean Plasma Glucose: 192 (calc)
eAG (mmol/L): 10.6 (calc)

## 2019-08-07 ENCOUNTER — Encounter: Payer: Self-pay | Admitting: "Endocrinology

## 2019-08-07 ENCOUNTER — Other Ambulatory Visit: Payer: Self-pay

## 2019-08-07 ENCOUNTER — Ambulatory Visit (INDEPENDENT_AMBULATORY_CARE_PROVIDER_SITE_OTHER): Payer: BC Managed Care – PPO | Admitting: "Endocrinology

## 2019-08-07 DIAGNOSIS — E782 Mixed hyperlipidemia: Secondary | ICD-10-CM

## 2019-08-07 DIAGNOSIS — Z794 Long term (current) use of insulin: Secondary | ICD-10-CM

## 2019-08-07 DIAGNOSIS — E118 Type 2 diabetes mellitus with unspecified complications: Secondary | ICD-10-CM

## 2019-08-07 DIAGNOSIS — E1165 Type 2 diabetes mellitus with hyperglycemia: Secondary | ICD-10-CM

## 2019-08-07 DIAGNOSIS — I10 Essential (primary) hypertension: Secondary | ICD-10-CM | POA: Diagnosis not present

## 2019-08-07 DIAGNOSIS — IMO0002 Reserved for concepts with insufficient information to code with codable children: Secondary | ICD-10-CM

## 2019-08-07 MED ORDER — TRULICITY 1.5 MG/0.5ML ~~LOC~~ SOAJ: 1.5000 mg | SUBCUTANEOUS | 4 refills | Status: DC

## 2019-08-07 MED ORDER — GLIPIZIDE ER 5 MG PO TB24: 5.0000 mg | ORAL_TABLET | Freq: Every day | ORAL | 3 refills | Status: DC

## 2019-08-07 MED ORDER — LANTUS SOLOSTAR 100 UNIT/ML ~~LOC~~ SOPN: 60.0000 [IU] | PEN_INJECTOR | Freq: Every day | SUBCUTANEOUS | 2 refills | Status: DC

## 2019-08-07 NOTE — Progress Notes (Signed)
08/07/2019                                                    Endocrinology Telehealth Visit Follow up Note -During COVID -19 Pandemic  This visit type was conducted due to national recommendations for restrictions regarding the COVID-19 Pandemic  in an effort to limit this patient's exposure and mitigate transmission of the corona virus.  Due to her co-morbid illnesses, Lisa Burns is at  moderate to high risk for complications without adequate follow up.  This format is felt to be most appropriate for her at this time.  I connected with this patient on 08/07/2019   by telephone and verified that I am speaking with the correct person using two identifiers. Lisa Burns, Nov 29, 1960. she has verbally consented to this visit. All issues noted in this document were discussed and addressed. The format was not optimal for physical exam.    Subjective:    Patient ID: Lisa Burns, female    DOB: 15-Dec-1960, PCP Mikey Kirschner, MD   Past Medical History:  Diagnosis Date  . Allergy   . Asthma   . Diabetes mellitus with neuropathy ([place])   . Diabetes mellitus without complication ([place])   . GERD (gastroesophageal reflux disease)   . Hypertension   . Microproteinuria   . Reflux   . Venous stasis    Past Surgical History:  Procedure Laterality Date  . CESAREAN SECTION    . COLONOSCOPY N/A 04/24/2016   Procedure: COLONOSCOPY;  Surgeon: Danie Binder, MD;  Location: [facility];  Service: Endoscopy;  Laterality: N/A;  8:30 Am  . COLONOSCOPY N/A 07/18/2019   Procedure: COLONOSCOPY;  Surgeon: Danie Binder, MD;  Location: [facility];  Service: Endoscopy;  Laterality: N/A;  1:00  . LAPAROSCOPIC NISSEN FUNDOPLICATION    . OTHER SURGICAL HISTORY     gerd surgery  . POLYPECTOMY  07/18/2019   Procedure: POLYPECTOMY;  Surgeon: Danie Binder, MD;  Location: [facility];  Service: Endoscopy;;  . THROAT SURGERY  2000   Social History   Socioeconomic History  . Marital status: Married     Spouse name: Not on file  . Number of children: 1  . Years of education: HS  . Highest education level: Not on file  Occupational History  . Occupation: [employer]   Social Needs  . Financial resource strain: Not on file  . Food insecurity    Worry: Not on file    Inability: Not on file  . Transportation needs    Medical: Not on file    Non-medical: Not on file  Tobacco Use  . Smoking status: Former Smoker    Packs/day: 1.00    Years: 5.00    Pack years: 5.00    Types: Cigarettes  . Smokeless tobacco: Never Used  Substance and Sexual Activity  . Alcohol use: Yes    Alcohol/week: 0.0 standard drinks    Comment: occasionally  . Drug use: No  . Sexual activity: Not on file  Lifestyle  . Physical activity    Days per week: Not on file    Minutes per session: Not on file  . Stress: Not on file  Relationships  . Social Herbalist on phone: Not on file    Gets  together: Not on file    Attends religious service: Not on file    Active member of club or organization: Not on file    Attends meetings of clubs or organizations: Not on file    Relationship status: Not on file  Other Topics Concern  . Not on file  Social History Narrative   Drinks 2 cans of soda a day    Outpatient Encounter Medications as of 08/07/2019  Medication Sig  . Azelaic Acid 15 % cream Apply 1 application topically daily. Apply to face  . clobetasol (TEMOVATE) 0.05 % external solution Apply 1 application topically 2 (two) times daily.  Derrill Memo ON 08/11/2019] Dulaglutide (TRULICITY) 1.5 0000000 SOPN Inject 1.5 mg as directed every Monday.  . enalapril (VASOTEC) 10 MG tablet TAKE ONE TABLET BY MOUTH AT BEDTIME (Patient taking differently: Take 10 mg by mouth at bedtime. )  . fluticasone (FLONASE) 50 MCG/ACT nasal spray Place 1 spray into both nostrils daily. (Patient taking differently: Place 1 spray into both nostrils daily as needed for allergies. )  . fluticasone (FLOVENT HFA) 110  MCG/ACT inhaler INHALE TWO PUFFS INTO THE LUNGS TWICE DAILY (Patient taking differently: Inhale 3 puffs into the lungs 2 (two) times daily as needed (shortness of breath). )  . furosemide (LASIX) 40 MG tablet Take 1 tablet (40 mg total) by mouth every morning.  . gabapentin (NEURONTIN) 400 MG capsule TAKE ONE (1) CAPSULE THREE (3) TIMES EACH DAY (Patient taking differently: Take 400 mg by mouth 3 (three) times daily as needed (pain). )  . glipiZIDE (GLUCOTROL XL) 5 MG 24 hr tablet Take 1 tablet (5 mg total) by mouth daily with breakfast.  . glucose blood test strip Use as instructed  . ibuprofen (ADVIL) 200 MG tablet Take 800 mg by mouth every 6 (six) hours as needed for headache or moderate pain.  . Insulin Glargine (LANTUS SOLOSTAR) 100 UNIT/ML Solostar Pen Inject 60 Units into the skin at bedtime.  . lansoprazole (PREVACID) 15 MG capsule Take 1 capsule (15 mg total) by mouth daily.  Marland Kitchen loratadine (CLARITIN) 10 MG tablet Take 10 mg by mouth daily.   . nortriptyline (PAMELOR) 50 MG capsule TAKE 2 CAPSULES BY MOUTH AT BEDTIME (Patient taking differently: Take 100 mg by mouth at bedtime. )  . PROAIR HFA 108 (90 Base) MCG/ACT inhaler INHALE TWO PUFFS INTO THE LUNGS EVERY SIX HOURS AS NEEDED (Patient taking differently: Inhale 2 puffs into the lungs every 6 (six) hours as needed for wheezing or shortness of breath. )  . triamcinolone cream (KENALOG) 0.1 % Apply 1 application topically 2 (two) times daily. (Patient taking differently: Apply 1 application topically 2 (two) times daily as needed (rosacea). )  . valACYclovir (VALTREX) 500 MG tablet Take 500 mg by mouth 2 (two) times daily as needed (cold sore flare ups).   . [DISCONTINUED] Dulaglutide (TRULICITY) 1.5 0000000 SOPN Inject 1.5 mg as directed every Monday.  . [DISCONTINUED] LANTUS SOLOSTAR 100 UNIT/ML Solostar Pen ADMINISTER 50 UNITS UNDER THE SKIN AT BEDTIME (Patient taking differently: Inject 50 Units into the skin at bedtime. )   No  facility-administered encounter medications on file as of 08/07/2019.    ALLERGIES: Allergies  Allergen Reactions  . Augmentin [Amoxicillin-Pot Clavulanate]     GI upset Did it involve swelling of the face/tongue/throat, SOB, or low BP? No Did it involve sudden or severe rash/hives, skin peeling, or any reaction on the inside of your mouth or nose? No Did you need to  seek medical attention at a hospital or doctor's office? No When did it last happen?15 years If all above answers are "NO", may proceed with cephalosporin use.   . Cinnamon Hives and Swelling  . Coconut Flavor Hives and Swelling  . Januvia [Sitagliptin]     GI upset  . Metformin And Related     GI upset  . Pravastatin     Pain in joints  . Strawberry (Diagnostic) Hives and Swelling   VACCINATION STATUS: Immunization History  Administered Date(s) Administered  . Influenza, Seasonal, Injecte, Preservative Fre 08/09/2016  . Influenza,inj,Quad PF,6+ Mos 12/27/2017  . Pneumococcal Polysaccharide-23 12/30/2013    Diabetes She presents for her follow-up diabetic visit. She has type 2 diabetes mellitus. Onset time: She was diagnosed at approximate age of 15 years. Her disease course has been worsening. There are no hypoglycemic associated symptoms. Pertinent negatives for hypoglycemia include no confusion, headaches, pallor or seizures. There are no diabetic associated symptoms. Pertinent negatives for diabetes include no chest pain, no polydipsia, no polyphagia and no polyuria. There are no hypoglycemic complications. Symptoms are worsening. There are no diabetic complications. Risk factors for coronary artery disease include diabetes mellitus, dyslipidemia, family history, hypertension, obesity, sedentary lifestyle and tobacco exposure. Current diabetic treatment includes insulin injections and oral agent (monotherapy). She is compliant with treatment most of the time. She is following a generally unhealthy diet. When  asked about meal planning, she reported none. She has not had a previous visit with a dietitian. She never participates in exercise. There is no change in her home blood glucose trend. Her breakfast blood glucose range is generally 180-200 mg/dl. Her dinner blood glucose range is generally 180-200 mg/dl. Her overall blood glucose range is 180-200 mg/dl. An ACE inhibitor/angiotensin II receptor blocker is being taken. Eye exam is current.  Hyperlipidemia This is a chronic problem. The current episode started more than 1 year ago. Exacerbating diseases include diabetes and obesity. Pertinent negatives include no chest pain, myalgias or shortness of breath. She is currently on no antihyperlipidemic treatment. Risk factors for coronary artery disease include diabetes mellitus, dyslipidemia, hypertension, obesity and a sedentary lifestyle.  Hypertension This is a chronic problem. The current episode started more than 1 year ago. The problem is controlled. Pertinent negatives include no chest pain, headaches, palpitations or shortness of breath. Risk factors for coronary artery disease include dyslipidemia, diabetes mellitus, obesity and sedentary lifestyle. Past treatments include ACE inhibitors.     Objective:    There were no vitals taken for this visit.  Wt Readings from Last 3 Encounters:  07/18/19 220 lb (99.8 kg)  02/04/19 220 lb (99.8 kg)  12/17/18 220 lb (99.8 kg)      Results for orders placed or performed during the hospital encounter of 07/18/19  Glucose, capillary  Result Value Ref Range   Glucose-Capillary 207 (H) 70 - 99 mg/dL   Diabetic Labs (most recent): Lab Results  Component Value Date   HGBA1C 8.3 (H) 08/05/2019   HGBA1C 6.9 (H) 01/28/2019   HGBA1C 5.7 (H) 08/27/2018   Lipid Panel     Component Value Date/Time   CHOL 208 (H) 11/29/2016 0729   TRIG 217 (H) 11/29/2016 0729   HDL 44 (L) 11/29/2016 0729   CHOLHDL 4.7 11/29/2016 0729   VLDL 43 (H) 11/29/2016 0729    LDLCALC 121 (H) 11/29/2016 0729     Assessment & Plan:   1. Uncontrolled type 2 diabetes mellitus with complication, with long-term current use of insulin ([place]) -  She reports above target glycemic profile, fasting average 189 over the last 40 days.  Her previsit A1c is 8.3%  increasing from 6.9%.   - She  remains at a high risk for more acute and chronic complications of diabetes which include CAD, CVA, CKD, retinopathy, and neuropathy. These are all discussed in detail with the patient.    Recent labs reviewed.   - I have re-counseled the patient on diet management and weight loss  by adopting a carbohydrate restricted / protein rich  Diet.  -She made significant changes in her diet.  - she  admits there is a room for improvement in her diet and drink choices. -  Suggestion is made for her to avoid simple carbohydrates  from her diet including Cakes, Sweet Desserts / Pastries, Ice Cream, Soda (diet and regular), Sweet Tea, Candies, Chips, Cookies, Sweet Pastries,  Store Bought Juices, Alcohol in Excess of  1-2 drinks a day, Artificial Sweeteners, Coffee Creamer, and "Sugar-free" Products. This will help patient to have stable blood glucose profile and potentially avoid unintended weight gain.  - Patient is advised to stick to a routine mealtimes to eat 3 meals  a day and avoid unnecessary snacks ( to snack only to correct hypoglycemia).  - I have approached patient with the following individualized plan to manage diabetes and patient agrees.  -Based on her presentation with above target glycemic profile, she is advised to increase her Lantus to 60 units nightly,   associated with strict monitoring of blood glucose at least once a day in the morning before breakfast, and at any other time as needed.    -She has tolerated and benefited from Trulicity therapy.  She is advised to continue Trulicity 1.5 mg subcutaneously weekly.   -She will benefit from glipizide treatment.  I discussed and  added glipizide 5 mg XL p.o. daily at breakfast.  - Patient specific target  for A1c; LDL, HDL, Triglycerides, and  Waist Circumference were discussed in detail.  2) BP/HTN: she is advised to home monitor blood pressure and report if > 140/90 on 2 separate readings.   She is advised to continue enalapril 10 mg p.o. daily.   3) Lipids/HPL:  LDL back at 121 from 87.  She did not tolerate statins including Crestor.  Her insurance did not provide coverage for Lovaza.     4)  Weight/Diet: She has gained 12 pounds recently, exercise, and carbohydrates information provided. - She is not ready for bariatric Surgery.  5) Chronic Care/Health Maintenance:  -Patient is on ACEI/ARB and Statin medications and encouraged to continue to follow up with Ophthalmology, Podiatrist at least yearly or according to recommendations, and advised to  stay away from smoking. I have recommended yearly flu vaccine and pneumonia vaccination at least every 5 years; moderate intensity exercise for up to 150 minutes weekly; and  sleep for at least 7 hours a day.  - I advised patient to maintain close follow up with Mikey Kirschner, MD for primary care needs.  - Patient Care Time Today:  25 min, of which >50% was spent in  counseling and the rest reviewing her  current and  previous labs/studies, previous treatments, her blood glucose readings, and medications' doses and developing a plan for long-term care based on the latest recommendations for standards of care.   Mearl Latin Olarte participated in the discussions, expressed understanding, and voiced agreement with the above plans.  All questions were answered to her satisfaction. she is encouraged to  contact clinic should she have any questions or concerns prior to her return visit.    Follow up plan: -Return in about 4 months (around 12/07/2019) for Bring Meter and Logs- A1c in Office, Include 8 log sheets.  Glade Lloyd, MD Phone: [phone]  Fax: [phone]  -  This  note was partially dictated with voice recognition software. Similar sounding words can be transcribed inadequately or may not  be corrected upon review.  08/07/2019, 2:38 PM

## 2019-08-13 ENCOUNTER — Telehealth: Payer: Self-pay | Admitting: "Endocrinology

## 2019-08-13 NOTE — Telephone Encounter (Signed)
She can get a sample pen of Ozempic 0.5mg  weekly.

## 2019-08-13 NOTE — Telephone Encounter (Signed)
Patient is aware 

## 2019-08-13 NOTE — Telephone Encounter (Signed)
Dr Nida Please advise 

## 2019-08-13 NOTE — Telephone Encounter (Signed)
Patient said she tried to pick up her Trulicity and the pharmacy said it could not be picked up until Oct. Please Advise

## 2019-09-09 DIAGNOSIS — M216X1 Other acquired deformities of right foot: Secondary | ICD-10-CM | POA: Diagnosis not present

## 2019-09-09 DIAGNOSIS — E1140 Type 2 diabetes mellitus with diabetic neuropathy, unspecified: Secondary | ICD-10-CM | POA: Diagnosis not present

## 2019-09-09 DIAGNOSIS — M216X2 Other acquired deformities of left foot: Secondary | ICD-10-CM | POA: Diagnosis not present

## 2019-09-09 DIAGNOSIS — L851 Acquired keratosis [keratoderma] palmaris et plantaris: Secondary | ICD-10-CM | POA: Diagnosis not present

## 2019-10-07 DIAGNOSIS — E1140 Type 2 diabetes mellitus with diabetic neuropathy, unspecified: Secondary | ICD-10-CM | POA: Diagnosis not present

## 2019-10-07 DIAGNOSIS — M216X2 Other acquired deformities of left foot: Secondary | ICD-10-CM | POA: Diagnosis not present

## 2019-10-07 DIAGNOSIS — M216X1 Other acquired deformities of right foot: Secondary | ICD-10-CM | POA: Diagnosis not present

## 2019-10-07 DIAGNOSIS — L851 Acquired keratosis [keratoderma] palmaris et plantaris: Secondary | ICD-10-CM | POA: Diagnosis not present

## 2019-10-14 ENCOUNTER — Other Ambulatory Visit: Payer: Self-pay | Admitting: Family Medicine

## 2019-10-16 NOTE — Telephone Encounter (Signed)
Ok one mo, rec six mo f u visit

## 2019-10-17 NOTE — Telephone Encounter (Signed)
One mo worth of meds, rec six mo ck up

## 2019-10-24 DIAGNOSIS — Z6841 Body Mass Index (BMI) 40.0 and over, adult: Secondary | ICD-10-CM | POA: Diagnosis not present

## 2019-10-24 DIAGNOSIS — Z01419 Encounter for gynecological examination (general) (routine) without abnormal findings: Secondary | ICD-10-CM | POA: Diagnosis not present

## 2019-10-24 DIAGNOSIS — Z1231 Encounter for screening mammogram for malignant neoplasm of breast: Secondary | ICD-10-CM | POA: Diagnosis not present

## 2019-10-28 ENCOUNTER — Other Ambulatory Visit: Payer: Self-pay | Admitting: "Endocrinology

## 2019-11-17 ENCOUNTER — Other Ambulatory Visit: Payer: Self-pay

## 2019-11-17 ENCOUNTER — Ambulatory Visit (INDEPENDENT_AMBULATORY_CARE_PROVIDER_SITE_OTHER): Payer: BC Managed Care – PPO | Admitting: Family Medicine

## 2019-11-17 DIAGNOSIS — I10 Essential (primary) hypertension: Secondary | ICD-10-CM | POA: Diagnosis not present

## 2019-11-17 DIAGNOSIS — E782 Mixed hyperlipidemia: Secondary | ICD-10-CM

## 2019-11-17 DIAGNOSIS — J4541 Moderate persistent asthma with (acute) exacerbation: Secondary | ICD-10-CM | POA: Diagnosis not present

## 2019-11-17 NOTE — Progress Notes (Signed)
   Subjective:  Audio plus video  Patient ID: Lisa Burns, female    DOB: 04-13-1961, 58 y.o.   MRN: [id number]  HPI Pt is needing refills on Enalapril 10mg , Furosemide 40 mg and Nortriptyline 50 mg. Pt states her BP has been good. She gets someone at work to check blood pressure.   Blood pressure medicine and blood pressure levels reviewed today with patient. Compliant with blood pressure medicine. States does not miss a dose. No obvious side effects. Blood pressure generally good when checked elsewhere. Watching salt intake.   Blood pressure overall good.  Asthma also overall in good control.  Not exercising as much as she would hope. Review of Systems No headache, no major weight loss or weight gain, no chest pain no back pain abdominal pain no change in bowel habits complete ROS otherwise negative     Objective:   Physical Exam  Virtual      Assessment & Plan:  Impression hypertension.  Apparent good control discussed maintain same meds  2.  Asthma clinically stable  3.  Neuropathy.  Medications discussed and refilled  4.  Type 2 diabetes followed by specialist at this time.  Medications refilled.  Diet exercise discussed.  Flu shot discussed.  We will hold off with blood work at this time

## 2019-11-20 ENCOUNTER — Ambulatory Visit (INDEPENDENT_AMBULATORY_CARE_PROVIDER_SITE_OTHER): Payer: BC Managed Care – PPO | Admitting: Family Medicine

## 2019-11-20 ENCOUNTER — Other Ambulatory Visit: Payer: Self-pay

## 2019-11-20 DIAGNOSIS — J329 Chronic sinusitis, unspecified: Secondary | ICD-10-CM

## 2019-11-20 DIAGNOSIS — J310 Chronic rhinitis: Secondary | ICD-10-CM | POA: Diagnosis not present

## 2019-11-20 MED ORDER — CEFDINIR 300 MG PO CAPS: ORAL_CAPSULE | ORAL | 0 refills | Status: DC

## 2019-11-20 MED ORDER — NORTRIPTYLINE HCL 50 MG PO CAPS: ORAL_CAPSULE | ORAL | 5 refills | Status: DC

## 2019-11-20 MED ORDER — ENALAPRIL MALEATE 10 MG PO TABS: ORAL_TABLET | ORAL | 1 refills | Status: DC

## 2019-11-20 MED ORDER — FUROSEMIDE 40 MG PO TABS: ORAL_TABLET | ORAL | 1 refills | Status: DC

## 2019-11-20 NOTE — Progress Notes (Signed)
   Subjective:  Audio only  Patient ID: Lisa Burns, female    DOB: March 11, 1961, 58 y.o.   MRN: [id number]  Cough This is a new problem. Episode onset: Monday. Episode frequency: comes and goes. The cough is productive of sputum. Treatments tried: inhaler. The treatment provided mild relief. Her past medical history is significant for asthma.   Virtual Visit via Video Note  I connected with ZEYNAB PIKUS on 11/20/19 at  8:30 AM EST by a video enabled telemedicine application and verified that I am speaking with the correct person using two identifiers.  Location: Patient: home Provider: office   I discussed the limitations of evaluation and management by telemedicine and the availability of in person appointments. The patient expressed understanding and agreed to proceed.  History of Present Illness:    Observations/Objective:   Assessment and Plan:   Follow Up Instructions:    I discussed the assessment and treatment plan with the patient. The patient was provided an opportunity to ask questions and all were answered. The patient agreed with the plan and demonstrated an understanding of the instructions.   The patient was advised to call back or seek an in-person evaluation if the symptoms worsen or if the condition fails to improve as anticipated.  I provided 17 minutes of non-face-to-face time during this encounter.   Vicente Males, LPN   cough off and om   Them some wheezing   No muscle aches  No body achies  No fever.  No sore throat.   Review of Systems  Respiratory: Positive for cough.   No vomiting no diarrhea no rash     Objective:   Physical Exam   Virtual     Assessment & Plan:  Impression flare asthma with element of productive cough.  Potential for Covid discussed.  Patient feels strongly no exposures.  Unable to test currently anywhere.  With this  New Year's Eve and everyone shutdown or filled.  Warning signs discussed.  Antibiotics  prescribed.

## 2019-12-10 ENCOUNTER — Ambulatory Visit: Payer: BC Managed Care – PPO | Admitting: "Endocrinology

## 2019-12-16 ENCOUNTER — Emergency Department (HOSPITAL_COMMUNITY): Payer: BC Managed Care – PPO

## 2019-12-16 ENCOUNTER — Other Ambulatory Visit: Payer: Self-pay

## 2019-12-16 ENCOUNTER — Encounter (HOSPITAL_COMMUNITY): Payer: Self-pay

## 2019-12-16 ENCOUNTER — Emergency Department (HOSPITAL_COMMUNITY)
Admission: EM | Admit: 2019-12-16 | Discharge: 2019-12-16 | Disposition: A | Discharge status: Home / Self Care | Payer: BC Managed Care – PPO | Attending: Emergency Medicine | Admitting: Emergency Medicine

## 2019-12-16 DIAGNOSIS — E119 Type 2 diabetes mellitus without complications: Secondary | ICD-10-CM | POA: Diagnosis not present

## 2019-12-16 DIAGNOSIS — R1011 Right upper quadrant pain: Secondary | ICD-10-CM | POA: Insufficient documentation

## 2019-12-16 DIAGNOSIS — Z79899 Other long term (current) drug therapy: Secondary | ICD-10-CM | POA: Insufficient documentation

## 2019-12-16 DIAGNOSIS — R42 Dizziness and giddiness: Secondary | ICD-10-CM | POA: Diagnosis not present

## 2019-12-16 DIAGNOSIS — R109 Unspecified abdominal pain: Secondary | ICD-10-CM | POA: Diagnosis not present

## 2019-12-16 DIAGNOSIS — Z87891 Personal history of nicotine dependence: Secondary | ICD-10-CM | POA: Insufficient documentation

## 2019-12-16 DIAGNOSIS — J45909 Unspecified asthma, uncomplicated: Secondary | ICD-10-CM | POA: Insufficient documentation

## 2019-12-16 DIAGNOSIS — I10 Essential (primary) hypertension: Secondary | ICD-10-CM | POA: Diagnosis not present

## 2019-12-16 LAB — CBC WITH DIFFERENTIAL/PLATELET
Abs Immature Granulocytes: 0.02 10*3/uL (ref 0.00–0.07)
Basophils Absolute: 0.1 10*3/uL (ref 0.0–0.1)
Basophils Relative: 1 %
Eosinophils Absolute: 0.1 10*3/uL (ref 0.0–0.5)
Eosinophils Relative: 2 %
HCT: 38.1 % (ref 36.0–46.0)
Hemoglobin: 12.4 g/dL (ref 12.0–15.0)
Immature Granulocytes: 0 %
Lymphocytes Relative: 14 %
Lymphs Abs: 1 10*3/uL (ref 0.7–4.0)
MCH: 28.1 pg (ref 26.0–34.0)
MCHC: 32.5 g/dL (ref 30.0–36.0)
MCV: 86.4 fL (ref 80.0–100.0)
Monocytes Absolute: 0.5 10*3/uL (ref 0.1–1.0)
Monocytes Relative: 6 %
Neutro Abs: 5.6 10*3/uL (ref 1.7–7.7)
Neutrophils Relative %: 77 %
Platelets: 246 10*3/uL (ref 150–400)
RBC: 4.41 MIL/uL (ref 3.87–5.11)
RDW: 13.4 % (ref 11.5–15.5)
WBC: 7.3 10*3/uL (ref 4.0–10.5)
nRBC: 0 % (ref 0.0–0.2)

## 2019-12-16 LAB — COMPREHENSIVE METABOLIC PANEL
ALT: 23 U/L (ref 0–44)
AST: 15 U/L (ref 15–41)
Albumin: 4 g/dL (ref 3.5–5.0)
Alkaline Phosphatase: 91 U/L (ref 38–126)
Anion gap: 10 (ref 5–15)
BUN: 17 mg/dL (ref 6–20)
CO2: 26 mmol/L (ref 22–32)
Calcium: 9.1 mg/dL (ref 8.9–10.3)
Chloride: 99 mmol/L (ref 98–111)
Creatinine, Ser: 0.75 mg/dL (ref 0.44–1.00)
GFR calc Af Amer: >60 mL/min (ref >60)
GFR calc non Af Amer: >60 mL/min (ref >60)
Glucose, Bld: 341 mg/dL — ABNORMAL HIGH (ref 70–99)
Potassium: 3.3 mmol/L — ABNORMAL LOW (ref 3.5–5.1)
Sodium: 135 mmol/L (ref 135–145)
Total Bilirubin: 0.5 mg/dL (ref 0.3–1.2)
Total Protein: 7.2 g/dL (ref 6.5–8.1)

## 2019-12-16 LAB — CBG MONITORING, ED: Glucose-Capillary: 306 mg/dL — ABNORMAL HIGH (ref 70–99)

## 2019-12-16 LAB — LIPASE, BLOOD: Lipase: 134 U/L — ABNORMAL HIGH (ref 11–51)

## 2019-12-16 MED ORDER — IOHEXOL 300 MG/ML  SOLN
100.0000 mL | Freq: Once | INTRAMUSCULAR | Status: AC | PRN
  Administered 2019-12-16: 100 mL via INTRAVENOUS

## 2019-12-16 MED ORDER — MECLIZINE HCL 25 MG PO TABS: 25.0000 mg | ORAL_TABLET | Freq: Three times a day (TID) | ORAL | 0 refills | Status: DC | PRN

## 2019-12-16 MED ORDER — SODIUM CHLORIDE 0.9 % IV SOLN: INTRAVENOUS | Status: DC

## 2019-12-16 MED ORDER — MECLIZINE HCL 12.5 MG PO TABS
25.0000 mg | ORAL_TABLET | Freq: Once | ORAL | Status: AC
  Administered 2019-12-16: 21:00:00 25 mg via ORAL
  Filled 2019-12-16: qty 2

## 2019-12-16 MED ORDER — HYDROCODONE-ACETAMINOPHEN 5-325 MG PO TABS: 1.0000 | ORAL_TABLET | Freq: Four times a day (QID) | ORAL | 0 refills | Status: DC | PRN

## 2019-12-16 NOTE — ED Triage Notes (Signed)
Pt started getting dizzy Sunday morning and states it went away. Dizziness has come back today. States it is worse when she moves her head. Pt is also having abdominal pain. Denies nausea or vomiting

## 2019-12-16 NOTE — ED Provider Notes (Addendum)
[facility] Provider Note   CSN: [id number] Arrival date & time: 12/16/19  1722     History Chief Complaint  Patient presents with  . Dizziness    Lisa Burns is a 59 y.o. female.  Patient with a complaint of dizziness somewhat vertigo-like starting on Sunday morning lasted for 5 to 6 hours then improved had a little bit yesterday and then it became more severe today had some difficulty driving.  Also associated with some right upper quadrant abdominal pain that started around the same time and was a bit worse today.  No nausea or vomiting no fevers no upper respiratory symptoms.  No prior history of similar symptoms.  No speech problems no numbness no weakness.  No significant headache.        Past Medical History:  Diagnosis Date  . Allergy   . Asthma   . Diabetes mellitus with neuropathy ([place])   . Diabetes mellitus without complication ([place])   . GERD (gastroesophageal reflux disease)   . Hypertension   . Microproteinuria   . Reflux   . Venous stasis     Patient Active Problem List   Diagnosis Date Noted  . Personal history of colonic polyps   . Morbid obesity ([place]) 06/11/2017  . Special screening for malignant neoplasms, colon   . Essential hypertension, benign 04/29/2013  . Mixed hyperlipidemia 04/29/2013  . Uncontrolled type 2 diabetes mellitus with complication, with long-term current use of insulin ([place]) 04/29/2013  . Hereditary and idiopathic peripheral neuropathy 04/29/2013  . Asthma with acute exacerbation 02/26/2013    Past Surgical History:  Procedure Laterality Date  . CESAREAN SECTION    . COLONOSCOPY N/A 04/24/2016   Procedure: COLONOSCOPY;  Surgeon: Danie Binder, MD;  [facility];  Service: Endoscopy;  Laterality: N/A;  8:30 Am  . COLONOSCOPY N/A 07/18/2019   Procedure: COLONOSCOPY;  Surgeon: Danie Binder, MD;  Location: [facility];  Service: Endoscopy;  Laterality: N/A;  1:00  . LAPAROSCOPIC NISSEN  FUNDOPLICATION    . OTHER SURGICAL HISTORY     gerd surgery  . POLYPECTOMY  07/18/2019   Procedure: POLYPECTOMY;  Surgeon: Danie Binder, MD;  Location: [facility];  Service: Endoscopy;;  . THROAT SURGERY  2000     OB History    Gravida  1   Para      Term      Preterm      AB      Living  1     SAB      TAB      Ectopic      Multiple      Live Births              Family History  Problem Relation Age of Onset  . Diabetes Mother   . Heart disease Father   . Cancer Father        lung  . Colon cancer Father   . Heart disease Sister   . Diabetes Sister   . Heart disease Brother   . Diabetes Brother   . Colon cancer Paternal Grandmother     Social History   Tobacco Use  . Smoking status: Former Smoker    Packs/day: 1.00    Years: 5.00    Pack years: 5.00    Types: Cigarettes  . Smokeless tobacco: Never Used  Substance Use Topics  . Alcohol use: Yes    Alcohol/week: 0.0 standard drinks  Comment: occasionally  . Drug use: No    Home Medications Prior to Admission medications   Medication Sig Start Date End Date Taking? Authorizing Provider  fluticasone (FLOVENT HFA) 110 MCG/ACT inhaler INHALE TWO PUFFS INTO THE LUNGS TWICE DAILY Patient taking differently: Inhale 3 puffs into the lungs 2 (two) times daily as needed (shortness of breath).  01/28/19  Yes Mikey Kirschner, MD  Azelaic Acid 15 % cream Apply 1 application topically daily. Apply to face 02/14/19   [provider]  cefdinir (OMNICEF) 300 MG capsule Take one tablet po BID for 10 days 11/20/19   Mikey Kirschner, MD  clobetasol (TEMOVATE) 0.05 % external solution Apply 1 application topically 2 (two) times daily. 02/12/19   [provider]  Dulaglutide (TRULICITY) 1.5 0000000 SOPN Inject 1.5 mg as directed every Monday. 08/11/19   Cassandria Anger, MD  enalapril (VASOTEC) 10 MG tablet TAKE ONE TABLET BY MOUTH EVERY NIGHT AT BEDTIME 11/20/19   Mikey Kirschner,  MD  fluticasone [facility]) 50 MCG/ACT nasal spray Place 1 spray into both nostrils daily. Patient taking differently: Place 1 spray into both nostrils daily as needed for allergies.  12/27/17   Mikey Kirschner, MD  furosemide (LASIX) 40 MG tablet TAKE ONE TABLET (40MG  TOTAL) BY MOUTH EVERY MORNING 11/20/19   Mikey Kirschner, MD  gabapentin (NEURONTIN) 400 MG capsule TAKE ONE (1) CAPSULE THREE (3) [place] Patient taking differently: Take 400 mg by mouth 3 (three) times daily as needed (pain).  03/21/19   Mikey Kirschner, MD  glipiZIDE (GLUCOTROL XL) 5 MG 24 hr tablet Take 1 tablet (5 mg total) by mouth daily with breakfast. 08/07/19   Cassandria Anger, MD  glucose blood test strip Use as instructed 02/26/13   Mikey Kirschner, MD  HYDROcodone-acetaminophen (NORCO/VICODIN) 5-325 MG tablet Take 1 tablet by mouth every 6 (six) hours as needed. 12/16/19   Fredia Sorrow, MD  ibuprofen (ADVIL) 200 MG tablet Take 800 mg by mouth every 6 (six) hours as needed for headache or moderate pain.    [provider]  lansoprazole (PREVACID) 15 MG capsule Take 1 capsule (15 mg total) by mouth daily. 12/27/17   Mikey Kirschner, MD  LANTUS SOLOSTAR 100 UNIT/ML Solostar Pen ADMINISTER 60 UNITS UNDER THE SKIN AT BEDTIME 10/28/19   Cassandria Anger, MD  loratadine (CLARITIN) 10 MG tablet Take 10 mg by mouth daily.     [provider]  meclizine (ANTIVERT) 25 MG tablet Take 1 tablet (25 mg total) by mouth 3 (three) times daily as needed for dizziness. 12/16/19   Fredia Sorrow, MD  nortriptyline (PAMELOR) 50 MG capsule TAKE TWO CAPSULES BY MOUTH AT BEDTIME 11/20/19   Mikey Kirschner, MD  PROAIR HFA 108 [phone] Base) MCG/ACT inhaler INHALE TWO PUFFS INTO THE LUNGS EVERY SIX HOURS AS NEEDED Patient taking differently: Inhale 2 puffs into the lungs every 6 (six) hours as needed for wheezing or shortness of breath.  04/29/19   Mikey Kirschner, MD  triamcinolone cream (KENALOG) 0.1 % Apply 1  application topically 2 (two) times daily. Patient taking differently: Apply 1 application topically 2 (two) times daily as needed (rosacea).  08/09/16   Mikey Kirschner, MD  valACYclovir (VALTREX) 500 MG tablet Take 500 mg by mouth 2 (two) times daily as needed (cold sore flare ups).  03/05/13   [provider]    Allergies    Augmentin [amoxicillin-pot clavulanate], Cinnamon, Coconut flavor, Januvia [  sitagliptin], Metformin and related, Pravastatin, and Strawberry (diagnostic)  Review of Systems   Review of Systems  Constitutional: Negative for chills and fever.  HENT: Negative for rhinorrhea and sore throat.   Eyes: Negative for visual disturbance.  Respiratory: Negative for cough and shortness of breath.   Cardiovascular: Negative for chest pain and leg swelling.  Gastrointestinal: Positive for abdominal pain. Negative for diarrhea, nausea and vomiting.  Genitourinary: Negative for dysuria.  Musculoskeletal: Negative for back pain and neck pain.  Skin: Negative for rash.  Neurological: Positive for dizziness. Negative for syncope, speech difficulty, weakness, light-headedness, numbness and headaches.  Hematological: Does not bruise/bleed easily.  Psychiatric/Behavioral: Negative for confusion.    Physical Exam Updated Vital Signs BP (!) 152/66   Pulse 85   Temp 98.3 F (36.8 C) (Oral)   Resp (!) 21   Ht 1.524 m (5')   Wt 99.3 kg   SpO2 99%   BMI 42.77 kg/m   Physical Exam Vitals and nursing note reviewed.  Constitutional:      General: She is not in acute distress.    Appearance: Normal appearance. She is well-developed.  HENT:     Head: Normocephalic and atraumatic.  Eyes:     Extraocular Movements: Extraocular movements intact.     Conjunctiva/sclera: Conjunctivae normal.     Pupils: Pupils are equal, round, and reactive to light.  Cardiovascular:     Rate and Rhythm: Normal rate and regular rhythm.     Heart sounds: No murmur.  Pulmonary:      Effort: Pulmonary effort is normal. No respiratory distress.     Breath sounds: Normal breath sounds.  Abdominal:     Palpations: Abdomen is soft.     Tenderness: There is no abdominal tenderness. There is no guarding.  Musculoskeletal:        General: No swelling. Normal range of motion.     Cervical back: Normal range of motion and neck supple.  Skin:    General: Skin is warm and dry.     Capillary Refill: Capillary refill takes less than 2 seconds.  Neurological:     General: No focal deficit present.     Mental Status: She is alert and oriented to person, place, and time.     Cranial Nerves: No cranial nerve deficit.     Sensory: No sensory deficit.     Motor: No weakness.     ED Results / Procedures / Treatments   Labs (all labs ordered are listed, but only abnormal results are displayed) Labs Reviewed  LIPASE, BLOOD - Abnormal; Notable for the following components:      Result Value   Lipase 134 (*)    All other components within normal limits  COMPREHENSIVE METABOLIC PANEL - Abnormal; Notable for the following components:   Potassium 3.3 (*)    Glucose, Bld 341 (*)    All other components within normal limits  CBG MONITORING, ED - Abnormal; Notable for the following components:   Glucose-Capillary 306 (*)    All other components within normal limits  CBC WITH DIFFERENTIAL/PLATELET    EKG EKG Interpretation  Date/Time:  Tuesday December 16 2019 18:43:06 EST Ventricular Rate:  85 PR Interval:    QRS Duration: 105 QT Interval:  404 QTC Calculation: 481 R Axis:   17 Text Interpretation: Sinus rhythm Nonspecific T abnormalities, lateral leads No significant change since last tracing Confirmed by Fredia Sorrow [phone]) on 12/16/2019 6:50:53 PM   Radiology MR Brain Wo Contrast (neuro  protocol)  Result Date: 12/16/2019 CLINICAL DATA:  Initial evaluation for dizziness for 1 week, ataxia. EXAM: MRI HEAD WITHOUT CONTRAST TECHNIQUE: Multiplanar, multiecho pulse  sequences of the brain and surrounding structures were obtained without intravenous contrast. COMPARISON:  Previous MRI from 01/09/2016. FINDINGS: Brain: Cerebral volume within normal limits for patient age. No focal parenchymal signal abnormality identified. No abnormal foci of restricted diffusion to suggest acute or subacute ischemia. Gray-white matter differentiation well maintained. No encephalomalacia to suggest chronic infarction. No foci of susceptibility artifact to suggest acute or chronic intracranial hemorrhage. No mass lesion, midline shift or mass effect. No hydrocephalus. No extra-axial fluid collection. Major dural sinuses are grossly patent. Incidental note made of an empty sella. Suprasellar region normal. Midline structures intact. Vascular: Major intracranial vascular flow voids well maintained and normal in appearance. Skull and upper cervical spine: Craniocervical junction normal. Visualized upper cervical spine within normal limits. Bone marrow signal intensity normal. No scalp soft tissue abnormality. Sinuses/Orbits: Globes and orbital soft tissues within normal limits. Paranasal sinuses are clear. No mastoid effusion. Inner ear structures normal. Other: None. IMPRESSION: 1. No acute intracranial abnormality. 2. Empty sella. While this finding is often incidental in nature and of no clinical significance, this can also be seen in the setting of idiopathic intracranial hypertension. 3. Otherwise unremarkable brain MRI for age. Electronically Signed   By: Jeannine Boga M.D.   On: 12/16/2019 19:56   CT Abdomen Pelvis W Contrast  Result Date: 12/16/2019 CLINICAL DATA:  Right-sided abdominal pain. EXAM: CT ABDOMEN AND PELVIS WITH CONTRAST TECHNIQUE: Multidetector CT imaging of the abdomen and pelvis was performed using the standard protocol following bolus administration of intravenous contrast. CONTRAST:  135mL OMNIPAQUE IOHEXOL 300 MG/ML  SOLN COMPARISON:  None. FINDINGS: Lower chest:  No acute abnormality. Hepatobiliary: No focal liver abnormality is seen. Diffuse fatty infiltration of the liver parenchyma is seen. No gallstones, gallbladder wall thickening, or biliary dilatation. Pancreas: Unremarkable. No pancreatic ductal dilatation or surrounding inflammatory changes. Spleen: There is mild splenomegaly. Adrenals/Urinary Tract: Adrenal glands are unremarkable. Kidneys are normal in size, without renal calculi or hydronephrosis. An 8 mm cystic appearing area is seen within the posterolateral aspect of the mid right kidney. Bladder is unremarkable. Stomach/Bowel: There is a small hiatal hernia. Surgical clips are seen along the anterior aspect of the expected region of the esophageal hiatus. Appendix appears normal. No evidence of bowel wall thickening, distention, or inflammatory changes. Vascular/Lymphatic: No significant vascular findings are present. No enlarged abdominal or pelvic lymph nodes. Reproductive: Uterus and bilateral adnexa are unremarkable. Other: No abdominal wall hernia or abnormality. No abdominopelvic ascites. Musculoskeletal: Moderate to marked severity degenerative changes seen at the levels of L2-L3, L3-L4 and L4-L5. IMPRESSION: 1. Diffuse fatty infiltration of the liver. 2. Mild splenomegaly. 3. Small hiatal hernia. 4. Moderate to marked degenerative changes of the lumbar spine. Electronically Signed   By: Virgina Norfolk M.D.   On: 12/16/2019 20:51    Procedures Procedures (including critical care time)  Medications Ordered in ED Medications  0.9 %  sodium chloride infusion ( Intravenous New Bag/Given 12/16/19 1841)  0.9 %  sodium chloride infusion (has no administration in time range)  meclizine (ANTIVERT) tablet 25 mg (has no administration in time range)  iohexol (OMNIPAQUE) 300 MG/ML solution 100 mL (100 mLs Intravenous Contrast Given 12/16/19 2024)    ED Course  I have reviewed the triage vital signs and the nursing notes.  Pertinent labs & imaging  results that were available during my  care of the patient were reviewed by me and considered in my medical decision making (see chart for details).    MDM Rules/Calculators/A&P                      Was able to get MRI brain before they left for the day.  It was negative.  This is significant because it rules out any concern for stroke as the cause of the dizziness vertigo.  Patient will be treated with Antivert and follow-up with her primary care doctor.  Regarding the right upper quadrant abdominal pain CT scan of the abdomen without any acute findings.  Patient's lipase was mildly elevated but there was no inflammation around the pancreas but I guess there could be some mild pancreatitis.  Also patient did have fatty liver.  Explanation is not sure.  The rest of the liver function tests were all normal.  Will treat with a short course of pain medicine for this and a work note.  Patient known to have diabetes.  Blood sugar slightly elevated but no signs of DKA.    Final Clinical Impression(s) / ED Diagnoses Final diagnoses:  Dizziness  Right upper quadrant abdominal pain    Rx / DC Orders ED Discharge Orders         Ordered    meclizine (ANTIVERT) 25 MG tablet  3 times daily PRN     12/16/19 2111    HYDROcodone-acetaminophen (NORCO/VICODIN) 5-325 MG tablet  Every 6 hours PRN     12/16/19 2111           Fredia Sorrow, MD 12/16/19 2119    Fredia Sorrow, MD 12/16/19 2120

## 2019-12-16 NOTE — Discharge Instructions (Addendum)
Work-up for the dizziness MRI brain without any acute findings.  Ruling out stroke.  Take the Antivert medication for the dizziness as needed.  If symptoms persist beyond a week follow-up with your primary care doctor.  CAT scan of the abdomen without any acute findings.  Lipase was slightly elevated but the CT of the abdomen showed no inflammation of the pancreas but would recommend a little clear liquid diet for a couple days low-fat diet.  Take the pain medicine as needed.  If that does not improve over the next few days follow-up with your doctor.  Return for any new or worse symptoms.

## 2019-12-17 ENCOUNTER — Telehealth: Payer: Self-pay | Admitting: Family Medicine

## 2019-12-17 ENCOUNTER — Ambulatory Visit: Payer: BC Managed Care – PPO | Admitting: Family Medicine

## 2019-12-17 VITALS — BP 144/86 | Temp 98.6°F | Ht 60.0 in | Wt 217.4 lb

## 2019-12-17 DIAGNOSIS — K859 Acute pancreatitis without necrosis or infection, unspecified: Secondary | ICD-10-CM

## 2019-12-17 MED ORDER — HYDROCODONE-ACETAMINOPHEN 5-325 MG PO TABS: ORAL_TABLET | ORAL | 0 refills | Status: DC

## 2019-12-17 MED FILL — Hydrocodone-Acetaminophen Tab 5-325 MG: ORAL | Qty: 6 | Status: AC

## 2019-12-17 NOTE — Telephone Encounter (Signed)
Medication pended. Pt is aware that med will be at [facility] once signed

## 2019-12-17 NOTE — Telephone Encounter (Signed)
Pt had visit today and is checking on pain medication being sent to pharmacy.

## 2019-12-17 NOTE — Telephone Encounter (Signed)
Lets do hydrocodone 5/35 numb 24 one q four to six prn pain

## 2019-12-17 NOTE — Progress Notes (Signed)
   Subjective:    Patient ID: Lisa Burns, female    DOB: Jul 28, 1961, 59 y.o.   MRN: [id number]  HPIabdominal pain started 3 days ago. pain on right upper quadrant. Went to ED last night and had a scan done.   Dizziness started about the same time 3 days ago. When pt got out of bed she felt dizzy. And now happening off and on throughout the day.   Complete hospital record and all reports and notes and all lab reports and all scans read at length today in presence of patient  Review of Systems No nausea no fever positive abdominal pain no change in bowel habits no chest pain no shortness of breath    Objective:   Physical Exam  Alert and oriented, vitals reviewed and stable, NAD ENT-TM's and ext canals WNL bilat via otoscopic exam Soft palate, tonsils and post pharynx WNL via oropharyngeal exam Neck-symmetric, no masses; thyroid nonpalpable and nontender Pulmonary-no tachypnea or accessory muscle use; Clear without wheezes via auscultation Card--no abnrml murmurs, rhythm reg and rate WNL Carotid pulses symmetric, without bruits Neurological exam no cerebellar findings right upper quadrant and epigastric area moderate tenderness to deep palpation      Assessment & Plan:  Impression acute pancreatitis.  Gallstones certainly the most likely culprit.  Discussed.  Abdominal CT was negative.  Right upper quadrant ultrasound recommended rationale discussed.  Warning signs discussed.  Further recommendations based results  2.  Transient vertigo.  Clinically improving.  Negative MRI.  Likely inner ear discussed continued supportive care expect gradual improvement  Further recommendations based on ultrasound results  Greater than 50% of this 30 minute face to face visit was spent in counseling and discussion and coordination of care regarding the above diagnosis/diagnosies

## 2019-12-18 ENCOUNTER — Ambulatory Visit: Payer: BC Managed Care – PPO | Admitting: "Endocrinology

## 2019-12-24 ENCOUNTER — Encounter: Payer: Self-pay | Admitting: Family Medicine

## 2019-12-25 ENCOUNTER — Ambulatory Visit (HOSPITAL_COMMUNITY)
Admission: RE | Admit: 2019-12-25 | Discharge: 2019-12-25 | Disposition: A | Discharge status: Home / Self Care | Payer: BC Managed Care – PPO | Source: Ambulatory Visit | Attending: Family Medicine | Admitting: Family Medicine

## 2019-12-25 ENCOUNTER — Other Ambulatory Visit: Payer: Self-pay

## 2019-12-25 DIAGNOSIS — K859 Acute pancreatitis without necrosis or infection, unspecified: Secondary | ICD-10-CM | POA: Diagnosis not present

## 2019-12-25 DIAGNOSIS — K7689 Other specified diseases of liver: Secondary | ICD-10-CM | POA: Diagnosis not present

## 2019-12-26 ENCOUNTER — Other Ambulatory Visit: Payer: Self-pay | Admitting: *Deleted

## 2019-12-26 DIAGNOSIS — R109 Unspecified abdominal pain: Secondary | ICD-10-CM

## 2019-12-29 ENCOUNTER — Other Ambulatory Visit: Payer: Self-pay | Admitting: "Endocrinology

## 2019-12-31 ENCOUNTER — Encounter: Payer: Self-pay | Admitting: Gastroenterology

## 2020-01-06 ENCOUNTER — Other Ambulatory Visit: Payer: Self-pay

## 2020-01-06 ENCOUNTER — Encounter: Payer: Self-pay | Admitting: "Endocrinology

## 2020-01-06 ENCOUNTER — Ambulatory Visit: Payer: BC Managed Care – PPO | Admitting: "Endocrinology

## 2020-01-06 VITALS — BP 157/97 | HR 80 | Ht 60.0 in | Wt 220.6 lb

## 2020-01-06 DIAGNOSIS — E118 Type 2 diabetes mellitus with unspecified complications: Secondary | ICD-10-CM | POA: Diagnosis not present

## 2020-01-06 DIAGNOSIS — Z794 Long term (current) use of insulin: Secondary | ICD-10-CM | POA: Diagnosis not present

## 2020-01-06 DIAGNOSIS — I10 Essential (primary) hypertension: Secondary | ICD-10-CM | POA: Diagnosis not present

## 2020-01-06 DIAGNOSIS — E782 Mixed hyperlipidemia: Secondary | ICD-10-CM

## 2020-01-06 DIAGNOSIS — E1165 Type 2 diabetes mellitus with hyperglycemia: Secondary | ICD-10-CM

## 2020-01-06 DIAGNOSIS — IMO0002 Reserved for concepts with insufficient information to code with codable children: Secondary | ICD-10-CM

## 2020-01-06 LAB — POCT GLYCOSYLATED HEMOGLOBIN (HGB A1C): Hemoglobin A1C: 7.9 % — AB (ref 4.0–5.6)

## 2020-01-06 MED ORDER — LANTUS SOLOSTAR 100 UNIT/ML ~~LOC~~ SOPN: PEN_INJECTOR | SUBCUTANEOUS | 3 refills | Status: DC

## 2020-01-06 MED ORDER — GLIPIZIDE ER 5 MG PO TB24: ORAL_TABLET | ORAL | 1 refills | Status: DC

## 2020-01-06 MED ORDER — EZETIMIBE 10 MG PO TABS: 10.0000 mg | ORAL_TABLET | Freq: Every day | ORAL | 1 refills | Status: DC

## 2020-01-06 MED ORDER — TRULICITY 1.5 MG/0.5ML ~~LOC~~ SOAJ: 1.5000 mg | SUBCUTANEOUS | 3 refills | Status: DC

## 2020-01-06 NOTE — Patient Instructions (Signed)

## 2020-01-06 NOTE — Progress Notes (Signed)
01/06/2020                     Endocrinology follow-up note   Subjective:    Patient ID: Lisa Burns, female    DOB: 16-Apr-1961, PCP Mikey Kirschner, MD   Past Medical History:  Diagnosis Date  . Allergy   . Asthma   . Diabetes mellitus with neuropathy ([place])   . Diabetes mellitus without complication ([place])   . GERD (gastroesophageal reflux disease)   . Hypertension   . Microproteinuria   . Reflux   . Venous stasis    Past Surgical History:  Procedure Laterality Date  . CESAREAN SECTION    . COLONOSCOPY N/A 04/24/2016   Procedure: COLONOSCOPY;  Surgeon: Danie Binder, MD;  Location: [facility];  Service: Endoscopy;  Laterality: N/A;  8:30 Am  . COLONOSCOPY N/A 07/18/2019   Procedure: COLONOSCOPY;  Surgeon: Danie Binder, MD;  Location: [facility];  Service: Endoscopy;  Laterality: N/A;  1:00  . LAPAROSCOPIC NISSEN FUNDOPLICATION    . OTHER SURGICAL HISTORY     gerd surgery  . POLYPECTOMY  07/18/2019   Procedure: POLYPECTOMY;  Surgeon: Danie Binder, MD;  Location: [facility];  Service: Endoscopy;;  . THROAT SURGERY  2000   Social History   Socioeconomic History  . Marital status: Married    Spouse name: Not on file  . Number of children: 1  . Years of education: HS  . Highest education level: Not on file  Occupational History  . Occupation: [employer]   Tobacco Use  . Smoking status: Former Smoker    Packs/day: 1.00    Years: 5.00    Pack years: 5.00    Types: Cigarettes  . Smokeless tobacco: Never Used  Substance and Sexual Activity  . Alcohol use: Yes    Alcohol/week: 0.0 standard drinks    Comment: occasionally  . Drug use: No  . Sexual activity: Not on file  Other Topics Concern  . Not on file  Social History Narrative   Drinks 2 cans of soda a day    Social Determinants of Health   Financial Resource Strain:   . Difficulty of Paying Living Expenses: Not on file  Food Insecurity:   . Worried About Charity fundraiser in the  Last Year: Not on file  . Ran Out of Food in the Last Year: Not on file  Transportation Needs:   . Lack of Transportation (Medical): Not on file  . Lack of Transportation (Non-Medical): Not on file  Physical Activity:   . Days of Exercise per Week: Not on file  . Minutes of Exercise per Session: Not on file  Stress:   . Feeling of Stress : Not on file  Social Connections:   . Frequency of Communication with Friends and Family: Not on file  . Frequency of Social Gatherings with Friends and Family: Not on file  . Attends Religious Services: Not on file  . Active Member of Clubs or Organizations: Not on file  . Attends Archivist Meetings: Not on file  . Marital Status: Not on file   Outpatient Encounter Medications as of 01/06/2020  Medication Sig  . Azelaic Acid 15 % cream Apply 1 application topically daily. Apply to face  . clobetasol (TEMOVATE) 0.05 % external solution Apply 1 application topically 2 (two) times daily.  Derrill Memo ON 01/12/2020] Dulaglutide (TRULICITY) 1.5 0000000 SOPN Inject 1.5 mg as directed  every Monday.  . enalapril (VASOTEC) 10 MG tablet TAKE ONE TABLET BY MOUTH EVERY NIGHT AT BEDTIME  . ezetimibe (ZETIA) 10 MG tablet Take 1 tablet (10 mg total) by mouth daily.  . fluticasone (FLONASE) 50 MCG/ACT nasal spray Place 1 spray into both nostrils daily. (Patient taking differently: Place 1 spray into both nostrils daily as needed for allergies. )  . fluticasone (FLOVENT HFA) 110 MCG/ACT inhaler INHALE TWO PUFFS INTO THE LUNGS TWICE DAILY (Patient taking differently: Inhale 3 puffs into the lungs 2 (two) times daily as needed (shortness of breath). )  . furosemide (LASIX) 40 MG tablet TAKE ONE TABLET (40MG  TOTAL) BY MOUTH EVERY MORNING  . gabapentin (NEURONTIN) 400 MG capsule TAKE ONE (1) CAPSULE THREE (3) TIMES EACH DAY (Patient taking differently: Take 400 mg by mouth 3 (three) times daily as needed (pain). )  . glipiZIDE (GLUCOTROL XL) 5 MG 24 hr tablet TAKE  ONE (1) TABLET BY MOUTH EVERY DAY  . glucose blood test strip Use as instructed  . HYDROcodone-acetaminophen (NORCO/VICODIN) 5-325 MG tablet Take one tablet po q 4-6 hrs prn pain  . Insulin Glargine (LANTUS SOLOSTAR) 100 UNIT/ML Solostar Pen ADMINISTER 70 UNITS UNDER THE SKIN AT BEDTIME  . lansoprazole (PREVACID) 15 MG capsule Take 1 capsule (15 mg total) by mouth daily.  Marland Kitchen loratadine (CLARITIN) 10 MG tablet Take 10 mg by mouth daily.   . nortriptyline (PAMELOR) 50 MG capsule TAKE TWO CAPSULES BY MOUTH AT BEDTIME  . PROAIR HFA 108 (90 Base) MCG/ACT inhaler INHALE TWO PUFFS INTO THE LUNGS EVERY SIX HOURS AS NEEDED (Patient taking differently: Inhale 2 puffs into the lungs every 6 (six) hours as needed for wheezing or shortness of breath. )  . valACYclovir (VALTREX) 500 MG tablet Take 500 mg by mouth 2 (two) times daily as needed (cold sore flare ups).   . [DISCONTINUED] Dulaglutide (TRULICITY) 1.5 0000000 SOPN Inject 1.5 mg as directed every Monday.  . [DISCONTINUED] glipiZIDE (GLUCOTROL XL) 5 MG 24 hr tablet TAKE ONE (1) TABLET BY MOUTH EVERY DAY  . [DISCONTINUED] HYDROcodone-acetaminophen (NORCO/VICODIN) 5-325 MG tablet Take 1 tablet by mouth every 6 (six) hours as needed.  . [DISCONTINUED] LANTUS SOLOSTAR 100 UNIT/ML Solostar Pen ADMINISTER 60 UNITS UNDER THE SKIN AT BEDTIME  . [DISCONTINUED] meclizine (ANTIVERT) 25 MG tablet Take 1 tablet (25 mg total) by mouth 3 (three) times daily as needed for dizziness.   No facility-administered encounter medications on file as of 01/06/2020.   ALLERGIES: Allergies  Allergen Reactions  . Augmentin [Amoxicillin-Pot Clavulanate]     GI upset Did it involve swelling of the face/tongue/throat, SOB, or low BP? No Did it involve sudden or severe rash/hives, skin peeling, or any reaction on the inside of your mouth or nose? No Did you need to seek medical attention at a hospital or doctor's office? No When did it last happen?15 years If all above  answers are "NO", may proceed with cephalosporin use.   . Cinnamon Hives and Swelling  . Coconut Flavor Hives and Swelling  . Januvia [Sitagliptin]     GI upset  . Metformin And Related     GI upset  . Pravastatin     Pain in joints  . Strawberry (Diagnostic) Hives and Swelling   VACCINATION STATUS: Immunization History  Administered Date(s) Administered  . Influenza, Seasonal, Injecte, Preservative Fre 08/09/2016  . Influenza,inj,Quad PF,6+ Mos 12/27/2017  . Pneumococcal Polysaccharide-23 12/30/2013    Diabetes She presents for her follow-up diabetic visit. She has  type 2 diabetes mellitus. Onset time: She was diagnosed at approximate age of 15 years. Her disease course has been worsening. There are no hypoglycemic associated symptoms. Pertinent negatives for hypoglycemia include no confusion, headaches, pallor or seizures. There are no diabetic associated symptoms. Pertinent negatives for diabetes include no chest pain, no polydipsia, no polyphagia and no polyuria. There are no hypoglycemic complications. Symptoms are worsening. There are no diabetic complications. Risk factors for coronary artery disease include diabetes mellitus, dyslipidemia, family history, hypertension, obesity, sedentary lifestyle and tobacco exposure. Current diabetic treatment includes insulin injections and oral agent (monotherapy). She is compliant with treatment most of the time. She is following a generally unhealthy diet. When asked about meal planning, she reported none. She has not had a previous visit with a dietitian. She never participates in exercise. There is no change in her home blood glucose trend. Her breakfast blood glucose range is generally 180-200 mg/dl. Her dinner blood glucose range is generally 180-200 mg/dl. Her overall blood glucose range is 180-200 mg/dl. An ACE inhibitor/angiotensin II receptor blocker is being taken. Eye exam is current.  Hyperlipidemia This is a chronic problem. The  current episode started more than 1 year ago. Exacerbating diseases include diabetes and obesity. Pertinent negatives include no chest pain, myalgias or shortness of breath. She is currently on no antihyperlipidemic treatment. Risk factors for coronary artery disease include diabetes mellitus, dyslipidemia, hypertension, obesity and a sedentary lifestyle.  Hypertension This is a chronic problem. The current episode started more than 1 year ago. The problem is controlled. Pertinent negatives include no chest pain, headaches, palpitations or shortness of breath. Risk factors for coronary artery disease include dyslipidemia, diabetes mellitus, obesity and sedentary lifestyle. Past treatments include ACE inhibitors.     Review of systems  Constitutional: + Minimally fluctuating body weight,  current  Body mass index is 43.08 kg/m. , no fatigue, no subjective hyperthermia, no subjective hypothermia Eyes: no blurry vision, no xerophthalmia ENT: no sore throat, no nodules palpated in throat, no dysphagia/odynophagia, no hoarseness Cardiovascular: no Chest Pain, no Shortness of Breath, no palpitations, no leg swelling Respiratory: no cough, no shortness of breath Gastrointestinal: no Nausea/Vomiting/Diarhhea Musculoskeletal: no muscle/joint aches Skin: no rashes, no hyperemia Neurological: no tremors, no numbness, no tingling, no dizziness Psychiatric: no depression, no anxiety   Objective:    BP (!) 157/97   Pulse 80   Ht 5' (1.524 m)   Wt 220 lb 9.6 oz (100.1 kg)   BMI 43.08 kg/m   Wt Readings from Last 3 Encounters:  01/06/20 220 lb 9.6 oz (100.1 kg)  12/17/19 217 lb 6.4 oz (98.6 kg)  12/16/19 219 lb (99.3 kg)     Physical Exam- Limited  Constitutional:  Body mass index is 43.08 kg/m. , not in acute distress, normal state of mind Eyes:  EOMI, no exophthalmos Neck: Supple Thyroid: No gross goiter Respiratory: Adequate breathing efforts Musculoskeletal: no gross deformities,  strength intact in all four extremities, no gross restriction of joint movements Skin:  no rashes, no hyperemia Neurological: no tremor with outstretched hands,    Results for orders placed or performed in visit on 01/06/20  HgB A1c  Result Value Ref Range   Hemoglobin A1C 7.9 (A) 4.0 - 5.6 %   HbA1c POC (<> result, manual entry)     HbA1c, POC (prediabetic range)     HbA1c, POC (controlled diabetic range)     Diabetic Labs (most recent): Lab Results  Component Value Date   HGBA1C 7.9 (  A) 01/06/2020   HGBA1C 8.3 (H) 08/05/2019   HGBA1C 6.9 (H) 01/28/2019   Lipid Panel     Component Value Date/Time   CHOL 208 (H) 11/29/2016 0729   TRIG 217 (H) 11/29/2016 0729   HDL 44 (L) 11/29/2016 0729   CHOLHDL 4.7 11/29/2016 0729   VLDL 43 (H) 11/29/2016 0729   LDLCALC 121 (H) 11/29/2016 0729     Assessment & Plan:   1. Uncontrolled type 2 diabetes mellitus with complication, with long-term current use of insulin ([place]) -She returns with improved glycemic profile and her point-of-care A1c of 7.9%, improving from 8.3%.    - She  remains at a high risk for more acute and chronic complications of diabetes which include CAD, CVA, CKD, retinopathy, and neuropathy. These are all discussed in detail with the patient.    Recent labs reviewed.   - I have re-counseled the patient on diet management and weight loss  by adopting a carbohydrate restricted / protein rich  Diet.  -She made significant changes in her diet.  - she  admits there is a room for improvement in her diet and drink choices. -  Suggestion is made for her to avoid simple carbohydrates  from her diet including Cakes, Sweet Desserts / Pastries, Ice Cream, Soda (diet and regular), Sweet Tea, Candies, Chips, Cookies, Sweet Pastries,  Store Bought Juices, Alcohol in Excess of  1-2 drinks a day, Artificial Sweeteners, Coffee Creamer, and "Sugar-free" Products. This will help patient to have stable blood glucose profile and  potentially avoid unintended weight gain.   - Patient is advised to stick to a routine mealtimes to eat 3 meals  a day and avoid unnecessary snacks ( to snack only to correct hypoglycemia).  - I have approached patient with the following individualized plan to manage diabetes and patient agrees.  -Based on her presentation with above target glycemic profile, she would benefit from a higher dose of basal insulin.  I discussed and increase her Lantus to 70 units nightly,  associated with strict monitoring of blood glucose at least once a day in the morning before breakfast, and at any other time as needed.    -She has tolerated and benefited from Trulicity therapy.  She is advised to continue Trulicity 1.5 mg subcutaneously weekly.     -She has tolerated glipizide.  She is advised to continue glipizide 5 mg XL p.o. daily at breakfast.  - Patient specific target  for A1c; LDL, HDL, Triglycerides, and  Waist Circumference were discussed in detail.  2) BP/HTN: -Her blood pressure is not controlled to target.   She is advised to continue enalapril 10 mg p.o. daily.   3) Lipids/HPL:  LDL back at 121 from 87.  She did not tolerate statins.  Her insurance did not provide coverage for Repatha, Lovaza.  I discussed and prescribed Zetia 10 mg p.o. nightly.    4)  Weight/Diet: Her BMI is 43.  She is a candidate for modest weight loss.  Exercise, and carbohydrates information provided. - She is hesitant to consider bariatric surgery.    5) Chronic Care/Health Maintenance:  -Patient is on ACEI/ARB and Statin medications and encouraged to continue to follow up with Ophthalmology, Podiatrist at least yearly or according to recommendations, and advised to  stay away from smoking. I have recommended yearly flu vaccine and pneumonia vaccination at least every 5 years; moderate intensity exercise for up to 150 minutes weekly; and  sleep for at least 7 hours a day.  -  I advised patient to maintain close  follow up with Mikey Kirschner, MD for primary care needs.  - Time spent on this patient care encounter:  35 min, of which > 50% was spent in  counseling and the rest reviewing her blood glucose logs , discussing her hypoglycemia and hyperglycemia episodes, reviewing her current and  previous labs / studies  ( including abstraction from other facilities) and medications  doses and developing a  long term treatment plan and documenting her care.   Please refer to Patient Instructions for Blood Glucose Monitoring and Insulin/Medications Dosing Guide"  in media tab for additional information. Please  also refer to " Patient Self Inventory" in the Media  tab for reviewed elements of pertinent patient history.  Mearl Latin Samek participated in the discussions, expressed understanding, and voiced agreement with the above plans.  All questions were answered to her satisfaction. she is encouraged to contact clinic should she have any questions or concerns prior to her return visit.     Follow up plan: -Return in about 4 months (around 05/05/2020) for Bring Meter and Logs- A1c in Office.  Glade Lloyd, MD Phone: [phone]  Fax: [phone]  -  This note was partially dictated with voice recognition software. Similar sounding words can be transcribed inadequately or may not  be corrected upon review.  01/06/2020, 4:32 PM

## 2020-01-07 ENCOUNTER — Other Ambulatory Visit: Payer: Self-pay | Admitting: "Endocrinology

## 2020-01-23 ENCOUNTER — Other Ambulatory Visit: Payer: Self-pay

## 2020-01-23 ENCOUNTER — Ambulatory Visit: Payer: BC Managed Care – PPO | Admitting: Gastroenterology

## 2020-01-23 ENCOUNTER — Encounter: Payer: Self-pay | Admitting: Gastroenterology

## 2020-01-23 DIAGNOSIS — R748 Abnormal levels of other serum enzymes: Secondary | ICD-10-CM | POA: Insufficient documentation

## 2020-01-23 DIAGNOSIS — R1010 Upper abdominal pain, unspecified: Secondary | ICD-10-CM | POA: Insufficient documentation

## 2020-01-23 DIAGNOSIS — K5900 Constipation, unspecified: Secondary | ICD-10-CM

## 2020-01-23 DIAGNOSIS — K760 Fatty (change of) liver, not elsewhere classified: Secondary | ICD-10-CM

## 2020-01-23 MED ORDER — LUBIPROSTONE 24 MCG PO CAPS: ORAL_CAPSULE | ORAL | 3 refills | Status: DC

## 2020-01-23 NOTE — Assessment & Plan Note (Signed)
Fatty liver and mild splenomegaly noted on recent imaging.  LFTs have been normal.  She has a normal platelet count.  Suspect [place] in the setting of diabetes and obesity.  Recommend tight glycemic control.  Recommend daily exercise.  2 cups of coffee with low-fat/low sugar creamer or substitute such as Premier protein to flavor her coffee.  Strive for loss of 10% of her body weight, approximately 20 pounds over the next 3 to 4 months.  Advised her that we will need to follow her every 6 months regarding her fatty liver.  Return to the office in 6 months.

## 2020-01-23 NOTE — Patient Instructions (Signed)
1. Try Amitiza one capsule once to twice daily with a meal for constipation. If you have diarrhea, you can skip a dose or day.  2. Call in 2-3 weeks and let me know if your upper abdominal pain is better with management of constipation. 3. Work on tight glycemic control, increasing daily exercise, 10-20 pound weight loss over the next three months. All of this will improve overall health and fatty liver.  4. Drink two cups of coffee daily for fatty liver. Use Premier protein for creamer substitute or stick with a low-fat/low sugar creamer option.  5. We will see you back in six months for follow up of fatty liver.    Fatty Liver Disease  Fatty liver disease occurs when too much fat has built up in your liver cells. Fatty liver disease is also called hepatic steatosis or steatohepatitis. The liver removes harmful substances from your bloodstream and produces fluids that your body needs. It also helps your body use and store energy from the food you eat. In many cases, fatty liver disease does not cause symptoms or problems. It is often diagnosed when tests are being done for other reasons. However, over time, fatty liver can cause inflammation that may lead to more serious liver problems, such as scarring of the liver (cirrhosis) and liver failure. Fatty liver is associated with insulin resistance, increased body fat, high blood pressure (hypertension), and high cholesterol. These are features of metabolic syndrome and increase your risk for stroke, diabetes, and heart disease. What are the causes? This condition may be caused by:  Drinking too much alcohol.  Poor nutrition.  Obesity.  Cushing's syndrome.  Diabetes.  High cholesterol.  Certain drugs.  Poisons.  Some viral infections.  Pregnancy. What increases the risk? You are more likely to develop this condition if you:  Abuse alcohol.  Are overweight.  Have diabetes.  Have hepatitis.  Have a high triglyceride level.   Are pregnant. What are the signs or symptoms? Fatty liver disease often does not cause symptoms. If symptoms do develop, they can include:  Fatigue.  Weakness.  Weight loss.  Confusion.  Abdominal pain.  Nausea and vomiting.  Yellowing of your skin and the white parts of your eyes (jaundice).  Itchy skin. How is this diagnosed? This condition may be diagnosed by:  A physical exam and medical history.  Blood tests.  Imaging tests, such as an ultrasound, CT scan, or MRI.  A liver biopsy. A small sample of liver tissue is removed using a needle. The sample is then looked at under a microscope. How is this treated? Fatty liver disease is often caused by other health conditions. Treatment for fatty liver may involve medicines and lifestyle changes to manage conditions such as:  Alcoholism.  High cholesterol.  Diabetes.  Being overweight or obese. Follow these instructions at home:   Do not drink alcohol. If you have trouble quitting, ask your health care provider how to safely quit with the help of medicine or a supervised program. This is important to keep your condition from getting worse.  Eat a healthy diet as told by your health care provider. Ask your health care provider about working with a diet and nutrition specialist (dietitian) to develop an eating plan.  Exercise regularly. This can help you lose weight and control your cholesterol and diabetes. Talk to your health care provider about an exercise plan and which activities are best for you.  Take over-the-counter and prescription medicines only as told by  your health care provider.  Keep all follow-up visits as told by your health care provider. This is important. Contact a health care provider if: You have trouble controlling your:  Blood sugar. This is especially important if you have diabetes.  Cholesterol.  Drinking of alcohol. Get help right away if:  You have abdominal pain.  You have  jaundice.  You have nausea and vomiting.  You vomit blood or material that looks like coffee grounds.  You have stools that are black, tar-like, or bloody. Summary  Fatty liver disease develops when too much fat builds up in the cells of your liver.  Fatty liver disease often causes no symptoms or problems. However, over time, fatty liver can cause inflammation that may lead to more serious liver problems, such as scarring of the liver (cirrhosis).  You are more likely to develop this condition if you abuse alcohol, are pregnant, are overweight, have diabetes, have hepatitis, or have high triglyceride levels.  Contact your health care provider if you have trouble controlling your weight, blood sugar, cholesterol, or drinking of alcohol. This information is not intended to replace advice given to you by your health care provider. Make sure you discuss any questions you have with your health care provider. Document Revised: 10/19/2017 Document Reviewed: 08/15/2017 Elsevier Patient Education  2020 [place].   Nonalcoholic Fatty Liver Disease Diet, Adult Nonalcoholic fatty liver disease is a condition that causes fat to build up in and around the liver. The disease makes it harder for the liver to work the way that it should. Following a healthy diet can help to keep nonalcoholic fatty liver disease under control. It can also help to prevent or improve conditions that are associated with the disease, such as heart disease, diabetes, high blood pressure, and abnormal cholesterol levels. Along with regular exercise, this diet:  Promotes weight loss.  Helps to control blood sugar levels.  Helps to improve the way that the body uses insulin. What are tips for following this plan? Reading food labels Always check food labels for:  The amount of saturated fat in a food. You should limit your intake of saturated fat. Saturated fat is found in foods that come from animals, including meat  and dairy products such as butter, cheese, and whole milk.  The amount of fiber in a food. You should choose high-fiber foods such as fruits, vegetables, and whole grains. Try to get 25-30 grams (g) of fiber a day.  Cooking  When cooking, use heart-healthy oils that are high in monounsaturated fats. These include olive oil, canola oil, and avocado oil.  Limit frying or deep-frying foods. Cook foods using healthy methods such as baking, boiling, steaming, and grilling instead. Meal planning  You may want to keep track of how many calories you take in. Eating the right amount of calories will help you achieve a healthy weight. Meeting with a registered dietitian can help you get started.  Limit how often you eat takeout and fast food. These foods are usually very high in fat, salt, and sugar.  Use the glycemic index (GI) to plan your meals. The index tells you how quickly a food will raise your blood sugar. Choose low-GI foods (GI less than 55). These foods take a longer time to raise blood sugar. A registered dietitian can help you identify foods lower on the GI scale. Lifestyle  You may want to follow a Mediterranean diet. This diet includes a lot of vegetables, lean meats or fish, whole  grains, fruits, and healthy oils and fats. What foods can I eat?  Fruits Bananas. Apples. Oranges. Grapes. Papaya. Mango. Pomegranate. Kiwi. Grapefruit. Cherries. Vegetables Lettuce. Spinach. Peas. Beets. Cauliflower. Cabbage. Broccoli. Carrots. Tomatoes. Squash. Eggplant. Herbs. Peppers. Onions. Cucumbers. Brussels sprouts. Yams and sweet potatoes. Beans. Lentils. Grains Whole wheat or whole-grain foods, including breads, crackers, cereals, and pasta. Stone-ground whole wheat. Unsweetened oatmeal. Bulgur. Barley. Quinoa. Brown or wild rice. Corn or whole wheat flour tortillas. Meats and other proteins Lean meats. Poultry. Tofu. Seafood and shellfish. Dairy Low-fat or fat-free dairy products, such as  yogurt, cottage cheese, or cheese. Beverages Water. Sugar-free drinks. Tea. Coffee. Low-fat or skim milk. Milk alternatives, such as soy or almond milk. Real fruit juice. Fats and oils Avocado. Canola or olive oil. Nuts and nut butters. Seeds. Seasonings and condiments Mustard. Relish. Low-fat, low-sugar ketchup and barbecue sauce. Low-fat or fat-free mayonnaise. Sweets and desserts Sugar-free sweets. The items listed above may not be a complete list of foods and beverages you can eat. Contact a dietitian for more information. What foods should I limit or avoid? Meats and other proteins Limit red meat to 1-2 times a week. Dairy [employer]. Fats and oils Palm oil and coconut oil. Fried foods. Other foods Processed foods. Foods that contain a lot of salt or sodium. Sweets and desserts Sweets that contain sugar. Beverages Sweetened drinks, such as sweet tea, milkshakes, iced sweet drinks, and sodas. Alcohol. The items listed above may not be a complete list of foods and beverages you should avoid. Contact a dietitian for more information. Where to find more information The [employer] of Diabetes and Digestive and Kidney Diseases: [employer] Summary  Nonalcoholic fatty liver disease is a condition that causes fat to build up in and around the liver.  Following a healthy diet can help to keep nonalcoholic fatty liver disease under control. Your diet should be rich in fruits, vegetables, whole grains, and lean proteins.  Limit your intake of saturated fat. Saturated fat is found in foods that come from animals, including meat and dairy products such as butter, cheese, and whole milk.  This diet promotes weight loss, helps to control blood sugar levels, and helps to improve the way that the body uses insulin. This information is not intended to replace advice given to you by your health care provider. Make sure you discuss any questions you have with your health care  provider. Document Revised: 02/28/2019 Document Reviewed: 11/28/2018 Elsevier Patient Education  [place].

## 2020-01-23 NOTE — Progress Notes (Signed)
Primary Care Physician: Mikey Kirschner, MD  Primary Gastroenterologist:  Barney Drain, MD   Chief Complaint  Patient presents with  . Abdominal Pain    upper abd, had [place] 12/2019 and told she had fatty liver.  . Nausea    " a lot"    HPI: Lisa Burns is a 59 y.o. female here for further evaluation of abdominal pain.  Patient seen in the ED back in January with acute onset abdominal pain.  She had a CT abdomen pelvis with contrast that showed fatty liver, mild splenomegaly, small hiatal hernia, moderate to marked degenerative changes of the lumbar spine.  ED labs as outlined below, minimally elevated lipase at 134.  Had an outpatient right upper quadrant ultrasound on February 4 showing no evidence of gallstones or gallbladder wall thickening, findings consistent with fatty liver, no nodular contour.  She was last seen at time of colonoscopy in August 2020.  She had 6 simple adenomas removed.  Noted to have external and internal hemorrhoids.  Plans for 3-year surveillance colonoscopy.  Patient states she has been having some issues with upper abdominal pain.  Prior to going to the ED her symptoms have been coming on for about a week.  She thought she was constipated but did not get a lot of relief after having a bowel movement.  Pain in the epigastrium/left upper quadrant area.  Was having a bowel movement about 1-2 times per week.  Using fiber Gummies every day.  No nausea or vomiting.  She has been having some increased heartburn lately.  Prior history of lap Nissen in 2000.  More recently has been on Prevacid 15 mg daily with adequate control of her heartburn.  She denies dysphagia.  Has been using a lot of Alka-Seltzer for abdominal discomfort.  She is not sure what formula ie if there is any aspirin in it.  She is concerned about her fatty liver.  She has done a lot of research about it.  She knows she needs better control of her diabetes, exercise, weight loss.  She does not  drink alcohol.  Current Outpatient Medications  Medication Sig Dispense Refill  . Azelaic Acid 15 % cream Apply 1 application topically daily. Apply to face    . clobetasol (TEMOVATE) 0.05 % external solution Apply 1 application topically 2 (two) times daily.    . Dulaglutide (TRULICITY) 1.5 0000000 SOPN Inject 1.5 mg as directed every Monday. 4 pen 3  . enalapril (VASOTEC) 10 MG tablet TAKE ONE TABLET BY MOUTH EVERY NIGHT AT BEDTIME 90 tablet 1  . ezetimibe (ZETIA) 10 MG tablet Take 1 tablet (10 mg total) by mouth daily. 90 tablet 1  . fluticasone (FLONASE) 50 MCG/ACT nasal spray Place 1 spray into both nostrils daily. (Patient taking differently: Place 1 spray into both nostrils daily as needed for allergies. ) 16 g 5  . fluticasone (FLOVENT HFA) 110 MCG/ACT inhaler INHALE TWO PUFFS INTO THE LUNGS TWICE DAILY (Patient taking differently: Inhale 3 puffs into the lungs 2 (two) times daily as needed (shortness of breath). ) 12 g 5  . furosemide (LASIX) 40 MG tablet TAKE ONE TABLET (40MG  TOTAL) BY MOUTH EVERY MORNING 90 tablet 1  . gabapentin (NEURONTIN) 400 MG capsule TAKE ONE (1) CAPSULE THREE (3) TIMES EACH DAY (Patient taking differently: Take 400 mg by mouth 3 (three) times daily as needed (pain). ) 90 capsule 5  . glipiZIDE (GLUCOTROL XL) 5 MG 24 hr tablet TAKE  ONE (1) TABLET BY MOUTH EVERY DAY 90 tablet 1  . glucose blood test strip Use as instructed 100 each 12  . HYDROcodone-acetaminophen (NORCO/VICODIN) 5-325 MG tablet Take one tablet po q 4-6 hrs prn pain 24 tablet 0  . Insulin Glargine (LANTUS SOLOSTAR) 100 UNIT/ML Solostar Pen ADMINISTER 70 UNITS UNDER THE SKIN AT BEDTIME 15 mL 2  . lansoprazole (PREVACID) 15 MG capsule Take 1 capsule (15 mg total) by mouth daily. 30 capsule 5  . loratadine (CLARITIN) 10 MG tablet Take 10 mg by mouth daily.     . nortriptyline (PAMELOR) 50 MG capsule TAKE TWO CAPSULES BY MOUTH AT BEDTIME 60 capsule 5  . PROAIR HFA 108 (90 Base) MCG/ACT inhaler INHALE  TWO PUFFS INTO THE LUNGS EVERY SIX HOURS AS NEEDED (Patient taking differently: Inhale 2 puffs into the lungs every 6 (six) hours as needed for wheezing or shortness of breath. ) 8.5 g 4  . valACYclovir (VALTREX) 500 MG tablet Take 500 mg by mouth 2 (two) times daily as needed (cold sore flare ups).      No current facility-administered medications for this visit.    Allergies as of 01/23/2020 - Review Complete 01/23/2020  Allergen Reaction Noted  . Augmentin [amoxicillin-pot clavulanate]  02/26/2013  . Cinnamon Hives and Swelling 07/14/2019  . Coconut flavor Hives and Swelling 07/14/2019  . Januvia [sitagliptin]  11/04/2015  . Metformin and related  04/29/2013  . Pravastatin  12/27/2017  . Strawberry (diagnostic) Hives and Swelling 07/14/2019    ROS:  General: Negative for anorexia, weight loss, fever, chills, fatigue, weakness. ENT: Negative for hoarseness, difficulty swallowing , nasal congestion. CV: Negative for chest pain, angina, palpitations, dyspnea on exertion, peripheral edema.  Respiratory: Negative for dyspnea at rest, dyspnea on exertion, cough, sputum, wheezing.  GI: See history of present illness. GU:  Negative for dysuria, hematuria, urinary incontinence, urinary frequency, nocturnal urination.  Endo: Negative for unusual weight change.    Physical Examination:   BP (!) 142/83   Pulse 87   Temp (!) 97.1 F (36.2 C) (Oral)   Ht 5' (1.524 m)   Wt 219 lb (99.3 kg)   BMI 42.77 kg/m   General: Well-nourished, well-developed in no acute distress.  Eyes: No icterus. Mouth: Oropharyngeal mucosa moist and pink , no lesions erythema or exudate. Lungs: Clear to auscultation bilaterally.  Heart: Regular rate and rhythm, no murmurs rubs or gallops.  Abdomen: Bowel sounds are normal, nontender, nondistended, no hepatosplenomegaly or masses, no abdominal bruits or hernia , no rebound or guarding.   Extremities: No lower extremity edema. No clubbing or  deformities. Neuro: Alert and oriented x 4   Skin: Warm and dry, no jaundice.   Psych: Alert and cooperative, normal mood and affect.  Labs:  Lab Results  Component Value Date   HGBA1C 7.9 (A) 01/06/2020   Lab Results  Component Value Date   CREATININE 0.75 12/16/2019   BUN 17 12/16/2019   NA 135 12/16/2019   K 3.3 (L) 12/16/2019   CL 99 12/16/2019   CO2 26 12/16/2019   Lab Results  Component Value Date   ALT 23 12/16/2019   AST 15 12/16/2019   ALKPHOS 91 12/16/2019   BILITOT 0.5 12/16/2019   Lab Results  Component Value Date   WBC 7.3 12/16/2019   HGB 12.4 12/16/2019   HCT 38.1 12/16/2019   MCV 86.4 12/16/2019   PLT 246 12/16/2019   Lab Results  Component Value Date   LIPASE 134 (H)  12/16/2019      Imaging Studies: US Abdomen Limited RUQ  Result Date: 12/25/2019 CLINICAL DATA:  59 year old female with a history of acute pancreatitis EXAM: ULTRASOUND ABDOMEN LIMITED RIGHT UPPER QUADRANT COMPARISON:  None. FINDINGS: Gallbladder: No gallstones or wall thickening visualized. No sonographic Murphy sign noted by sonographer. Common bile duct: Diameter: 3 mm-4 mm Liver: Heterogeneous echotexture of liver parenchyma without nodular contour. Portal vein is patent on color Doppler imaging with normal direction of blood flow towards the liver. Other: None. IMPRESSION: Sonographic survey negative for cholelithiasis, and negative for signs of acute cholecystitis. Heterogeneous appearance of the liver suggesting medical liver disease. Electronically Signed   By: Corrie Mckusick D.O.   On: 12/25/2019 12:13

## 2020-01-23 NOTE — Assessment & Plan Note (Signed)
Elevated lipase somewhat nonspecific.  CT of the pancreas was unremarkable.  Will address further management with Dr. Oneida Alar ie consider MRI pancreas/MRCP to rule out underlying neoplasm and microlithiasis.  Further recommendations to follow.

## 2020-01-23 NOTE — Assessment & Plan Note (Signed)
Add Amitiza 24 mcg 1-2 times daily for management of constipation.  We discussed options of pursuing upper endoscopy at this time for her upper abdominal pain the patient feels like her pain is related to constipation.  She is to call in 2 to 3 weeks with a progress report.  If she continues to have upper abdominal discomfort after management of constipation, would pursue upper endoscopy at that time.

## 2020-02-05 ENCOUNTER — Other Ambulatory Visit: Payer: Self-pay

## 2020-02-05 MED ORDER — TRULICITY 1.5 MG/0.5ML ~~LOC~~ SOAJ: 1.5000 mg | SUBCUTANEOUS | 1 refills | Status: DC

## 2020-02-17 ENCOUNTER — Ambulatory Visit: Payer: BC Managed Care – PPO | Admitting: Family Medicine

## 2020-02-17 ENCOUNTER — Other Ambulatory Visit: Payer: Self-pay

## 2020-02-17 VITALS — BP 142/90 | Temp 98.1°F | Ht 60.0 in | Wt 222.6 lb

## 2020-02-17 DIAGNOSIS — I10 Essential (primary) hypertension: Secondary | ICD-10-CM

## 2020-02-17 DIAGNOSIS — G609 Hereditary and idiopathic neuropathy, unspecified: Secondary | ICD-10-CM | POA: Diagnosis not present

## 2020-02-17 DIAGNOSIS — E782 Mixed hyperlipidemia: Secondary | ICD-10-CM

## 2020-02-17 DIAGNOSIS — J4541 Moderate persistent asthma with (acute) exacerbation: Secondary | ICD-10-CM

## 2020-02-17 MED ORDER — LANSOPRAZOLE 15 MG PO CPDR: 15.0000 mg | DELAYED_RELEASE_CAPSULE | Freq: Every day | ORAL | 1 refills | Status: DC

## 2020-02-17 MED ORDER — NORTRIPTYLINE HCL 50 MG PO CAPS: ORAL_CAPSULE | ORAL | 1 refills | Status: DC

## 2020-02-17 MED ORDER — ENALAPRIL MALEATE 20 MG PO TABS: ORAL_TABLET | ORAL | 1 refills | Status: DC

## 2020-02-17 MED ORDER — FLOVENT HFA 110 MCG/ACT IN AERO: INHALATION_SPRAY | RESPIRATORY_TRACT | 5 refills | Status: DC

## 2020-02-17 MED ORDER — GABAPENTIN 400 MG PO CAPS: ORAL_CAPSULE | ORAL | 1 refills | Status: DC

## 2020-02-17 MED ORDER — FUROSEMIDE 40 MG PO TABS: ORAL_TABLET | ORAL | 1 refills | Status: DC

## 2020-02-17 NOTE — Progress Notes (Signed)
   Subjective:    Patient ID: Lisa Burns, female    DOB: 03/18/1961, 59 y.o.   MRN: [id number]  Hypertension This is a chronic problem. Associated symptoms include headaches. (Hands swelling) Compliance problems: takes med every day, walks at least 7,000 steps a day, tries to eat healthy.    Blood pressure medicine and blood pressure levels reviewed today with patient. Compliant with blood pressure medicine. States does not miss a dose. No obvious side effects. Blood pressure generally good when checked elsewhere. Watching salt intake.  Asthma overall stable.  Compliant with the Flovent.  Occasional use of rescue inhaler.  But not very often  Chronic headaches ongoing but current medication definitely helps  Compliant with her gabapentin for her neuropathy.   Review of Systems  Neurological: Positive for headaches.       Objective:   Physical Exam   Alert vitals stable, NAD. Blood pressure good on repeat. HEENT normal. Lungs clear. Heart regular rate and rhythm.      Assessment & Plan:  Impression 1.  Hypertension.  Overall disease control discussed maintain same meds  2.  Chronic asthma.  Stable.  Patient to maintain Flovent.  3.  Chronic neuropathy.  Response to gabapentin  4.  Chronic headaches.  Nortriptyline still benefit  Diet exercise discussed.  Medications refilled.  Follow-up in 6 months.  Dr. Dorris Fetch is a continue to manage diabetes

## 2020-03-02 DIAGNOSIS — Z794 Long term (current) use of insulin: Secondary | ICD-10-CM | POA: Diagnosis not present

## 2020-03-02 DIAGNOSIS — H524 Presbyopia: Secondary | ICD-10-CM | POA: Diagnosis not present

## 2020-03-02 DIAGNOSIS — E119 Type 2 diabetes mellitus without complications: Secondary | ICD-10-CM | POA: Diagnosis not present

## 2020-03-02 DIAGNOSIS — H5213 Myopia, bilateral: Secondary | ICD-10-CM | POA: Diagnosis not present

## 2020-03-02 DIAGNOSIS — H2513 Age-related nuclear cataract, bilateral: Secondary | ICD-10-CM | POA: Diagnosis not present

## 2020-03-02 DIAGNOSIS — H52202 Unspecified astigmatism, left eye: Secondary | ICD-10-CM | POA: Diagnosis not present

## 2020-03-02 DIAGNOSIS — H04123 Dry eye syndrome of bilateral lacrimal glands: Secondary | ICD-10-CM | POA: Diagnosis not present

## 2020-04-12 ENCOUNTER — Telehealth: Payer: Self-pay

## 2020-04-12 NOTE — Telephone Encounter (Signed)
Pt called to get a new Amitiza RX savings card. RX savings card was mailed to the pt. Pt's cost for Amitiza is $60.00.

## 2020-05-05 ENCOUNTER — Encounter: Payer: Self-pay | Admitting: "Endocrinology

## 2020-05-05 ENCOUNTER — Other Ambulatory Visit: Payer: Self-pay

## 2020-05-05 ENCOUNTER — Ambulatory Visit (INDEPENDENT_AMBULATORY_CARE_PROVIDER_SITE_OTHER): Payer: BC Managed Care – PPO | Admitting: "Endocrinology

## 2020-05-05 VITALS — BP 134/75 | HR 84 | Ht 60.0 in | Wt 221.0 lb

## 2020-05-05 DIAGNOSIS — Z794 Long term (current) use of insulin: Secondary | ICD-10-CM | POA: Diagnosis not present

## 2020-05-05 DIAGNOSIS — I10 Essential (primary) hypertension: Secondary | ICD-10-CM | POA: Diagnosis not present

## 2020-05-05 DIAGNOSIS — E782 Mixed hyperlipidemia: Secondary | ICD-10-CM

## 2020-05-05 DIAGNOSIS — E118 Type 2 diabetes mellitus with unspecified complications: Secondary | ICD-10-CM

## 2020-05-05 DIAGNOSIS — E1165 Type 2 diabetes mellitus with hyperglycemia: Secondary | ICD-10-CM | POA: Diagnosis not present

## 2020-05-05 DIAGNOSIS — IMO0002 Reserved for concepts with insufficient information to code with codable children: Secondary | ICD-10-CM

## 2020-05-05 LAB — POCT GLYCOSYLATED HEMOGLOBIN (HGB A1C): Hemoglobin A1C: 7.1 % — AB (ref 4.0–5.6)

## 2020-05-05 NOTE — Progress Notes (Signed)
05/05/2020                     Endocrinology follow-up note   Subjective:    Patient ID: Lisa Burns, female    DOB: 09/19/1961, PCP Mikey Kirschner, MD   Past Medical History:  Diagnosis Date  . Allergy   . Asthma   . Diabetes mellitus with neuropathy ([place])   . Diabetes mellitus without complication ([place])   . GERD (gastroesophageal reflux disease)   . Hypertension   . Microproteinuria   . Reflux   . Venous stasis    Past Surgical History:  Procedure Laterality Date  . CESAREAN SECTION    . COLONOSCOPY N/A 04/24/2016   Procedure: COLONOSCOPY;  Surgeon: Danie Binder, MD;  Location: [facility];  Service: Endoscopy;  Laterality: N/A;  8:30 Am  . COLONOSCOPY N/A 07/18/2019   Dr. Oneida Alar: 6 simple adenomas removed.  External/internal hemorrhoids.  Next colonoscopy in 3 years.  Marland Kitchen LAPAROSCOPIC NISSEN FUNDOPLICATION  6606  . OTHER SURGICAL HISTORY     gerd surgery  . POLYPECTOMY  07/18/2019   Procedure: POLYPECTOMY;  Surgeon: Danie Binder, MD;  Location: [facility];  Service: Endoscopy;;  . THROAT SURGERY  2000   Social History   Socioeconomic History  . Marital status: Married    Spouse name: Not on file  . Number of children: 1  . Years of education: HS  . Highest education level: Not on file  Occupational History  . Occupation: [employer]   Tobacco Use  . Smoking status: Former Smoker    Packs/day: 1.00    Years: 5.00    Pack years: 5.00    Types: Cigarettes  . Smokeless tobacco: Never Used  Vaping Use  . Vaping Use: Never used  Substance and Sexual Activity  . Alcohol use: Yes    Alcohol/week: 0.0 standard drinks    Comment: occasionally  . Drug use: No  . Sexual activity: Not on file  Other Topics Concern  . Not on file  Social History Narrative   Drinks 2 cans of soda a day    Social Determinants of Health   Financial Resource Strain:   . Difficulty of Paying Living Expenses:   Food Insecurity:   . Worried About Sales executive in the Last Year:   . Arboriculturist in the Last Year:   Transportation Needs:   . Film/video editor (Medical):   Marland Kitchen Lack of Transportation (Non-Medical):   Physical Activity:   . Days of Exercise per Week:   . Minutes of Exercise per Session:   Stress:   . Feeling of Stress :   Social Connections:   . Frequency of Communication with Friends and Family:   . Frequency of Social Gatherings with Friends and Family:   . Attends Religious Services:   . Active Member of Clubs or Organizations:   . Attends Archivist Meetings:   Marland Kitchen Marital Status:    Outpatient Encounter Medications as of 05/05/2020  Medication Sig  . Azelaic Acid 15 % cream Apply 1 application topically daily. Apply to face  . clobetasol (TEMOVATE) 0.05 % external solution Apply 1 application topically 2 (two) times daily.  . Dulaglutide (TRULICITY) 1.5 TK/1.6WF SOPN Inject 1.5 mg as directed every Monday.  . enalapril (VASOTEC) 20 MG tablet TAKE ONE TABLET BY MOUTH EVERY NIGHT AT BEDTIME  . fluticasone (FLONASE) 50 MCG/ACT nasal spray Place 1  spray into both nostrils daily. (Patient taking differently: Place 1 spray into both nostrils daily as needed for allergies. )  . fluticasone (FLOVENT HFA) 110 MCG/ACT inhaler INHALE TWO PUFFS INTO THE LUNGS TWICE DAILY  . furosemide (LASIX) 40 MG tablet TAKE ONE TABLET (40MG  TOTAL) BY MOUTH EVERY MORNING  . gabapentin (NEURONTIN) 400 MG capsule TAKE ONE (1) CAPSULE THREE (3) TIMES EACH DAY  . glipiZIDE (GLUCOTROL XL) 5 MG 24 hr tablet TAKE ONE (1) TABLET BY MOUTH EVERY DAY  . glucose blood test strip Use as instructed  . Insulin Glargine (LANTUS SOLOSTAR) 100 UNIT/ML Solostar Pen ADMINISTER 70 UNITS UNDER THE SKIN AT BEDTIME  . lansoprazole (PREVACID) 15 MG capsule Take 1 capsule (15 mg total) by mouth daily.  Marland Kitchen loratadine (CLARITIN) 10 MG tablet Take 10 mg by mouth daily.   Marland Kitchen lubiprostone (AMITIZA) 24 MCG capsule Take one capsule once to twice daily with meals for  constipation  . nortriptyline (PAMELOR) 50 MG capsule TAKE TWO CAPSULES BY MOUTH AT BEDTIME  . PROAIR HFA 108 (90 Base) MCG/ACT inhaler INHALE TWO PUFFS INTO THE LUNGS EVERY SIX HOURS AS NEEDED (Patient taking differently: Inhale 2 puffs into the lungs every 6 (six) hours as needed for wheezing or shortness of breath. )  . valACYclovir (VALTREX) 500 MG tablet Take 500 mg by mouth 2 (two) times daily as needed (cold sore flare ups).    No facility-administered encounter medications on file as of 05/05/2020.   ALLERGIES: Allergies  Allergen Reactions  . Augmentin [Amoxicillin-Pot Clavulanate]     GI upset Did it involve swelling of the face/tongue/throat, SOB, or low BP? No Did it involve sudden or severe rash/hives, skin peeling, or any reaction on the inside of your mouth or nose? No Did you need to seek medical attention at a hospital or doctor's office? No When did it last happen?15 years If all above answers are "NO", may proceed with cephalosporin use.   . Cinnamon Hives and Swelling  . Coconut Flavor Hives and Swelling  . Januvia [Sitagliptin]     GI upset  . Metformin And Related     GI upset  . Pravastatin     Pain in joints  . Strawberry (Diagnostic) Hives and Swelling   VACCINATION STATUS: Immunization History  Administered Date(s) Administered  . Influenza, Seasonal, Injecte, Preservative Fre 08/09/2016  . Influenza,inj,Quad PF,6+ Mos 12/27/2017  . Pneumococcal Polysaccharide-23 12/30/2013    Diabetes She presents for her follow-up diabetic visit. She has type 2 diabetes mellitus. Onset time: She was diagnosed at approximate age of 36 years. Her disease course has been improving. There are no hypoglycemic associated symptoms. Pertinent negatives for hypoglycemia include no confusion, headaches, pallor or seizures. There are no diabetic associated symptoms. Pertinent negatives for diabetes include no chest pain, no polydipsia, no polyphagia and no polyuria. There  are no hypoglycemic complications. Symptoms are improving. There are no diabetic complications. Risk factors for coronary artery disease include diabetes mellitus, dyslipidemia, family history, hypertension, obesity, sedentary lifestyle and tobacco exposure. Current diabetic treatment includes insulin injections and oral agent (monotherapy). She is compliant with treatment most of the time. Her weight is fluctuating minimally. She is following a generally unhealthy diet. When asked about meal planning, she reported none. She has not had a previous visit with a dietitian. She never participates in exercise. Her home blood glucose trend is decreasing steadily. Her breakfast blood glucose range is generally 140-180 mg/dl. Her overall blood glucose range is 140-180 mg/dl. (She  presents with controlled glycemic profile to near target levels.  Point-of-care A1c 7.1%, improving from 8.3%.  No documented or reported hypoglycemia.) An ACE inhibitor/angiotensin II receptor blocker is being taken. Eye exam is current.  Hyperlipidemia This is a chronic problem. The current episode started more than 1 year ago. Exacerbating diseases include diabetes and obesity. Pertinent negatives include no chest pain, myalgias or shortness of breath. She is currently on no antihyperlipidemic treatment. Risk factors for coronary artery disease include diabetes mellitus, dyslipidemia, hypertension, obesity and a sedentary lifestyle.  Hypertension This is a chronic problem. The current episode started more than 1 year ago. The problem is controlled. Pertinent negatives include no chest pain, headaches, palpitations or shortness of breath. Risk factors for coronary artery disease include dyslipidemia, diabetes mellitus, obesity and sedentary lifestyle. Past treatments include ACE inhibitors.     Review of systems  Constitutional: + Minimally fluctuating body weight,  current  Body mass index is 43.16 kg/m. , no fatigue, no subjective  hyperthermia, no subjective hypothermia Eyes: no blurry vision, no xerophthalmia ENT: no sore throat, no nodules palpated in throat, no dysphagia/odynophagia, no hoarseness Cardiovascular: no Chest Pain, no Shortness of Breath, no palpitations, no leg swelling Respiratory: no cough, no shortness of breath Gastrointestinal: no Nausea/Vomiting/Diarhhea Musculoskeletal: no muscle/joint aches Skin: no rashes, no hyperemia Neurological: no tremors, no numbness, no tingling, no dizziness Psychiatric: no depression, no anxiety    Objective:    BP 134/75   Pulse 84   Ht 5' (1.524 m)   Wt 221 lb (100.2 kg)   BMI 43.16 kg/m   Wt Readings from Last 3 Encounters:  05/05/20 221 lb (100.2 kg)  02/17/20 222 lb 9.6 oz (101 kg)  01/23/20 219 lb (99.3 kg)     Physical Exam- Limited  Constitutional:  Body mass index is 43.16 kg/m. , not in acute distress, normal state of mind Eyes:  EOMI, no exophthalmos Neck: Supple Thyroid: No gross goiter Respiratory: Adequate breathing efforts Musculoskeletal: no gross deformities, strength intact in all four extremities, no gross restriction of joint movements Skin:  no rashes, no hyperemia Neurological: no tremor with outstretched hands,    Results for orders placed or performed in visit on 05/05/20  HgB A1c  Result Value Ref Range   Hemoglobin A1C 7.1 (A) 4.0 - 5.6 %   HbA1c POC (<> result, manual entry)     HbA1c, POC (prediabetic range)     HbA1c, POC (controlled diabetic range)     Diabetic Labs (most recent): Lab Results  Component Value Date   HGBA1C 7.1 (A) 05/05/2020   HGBA1C 7.9 (A) 01/06/2020   HGBA1C 8.3 (H) 08/05/2019   Lipid Panel     Component Value Date/Time   CHOL 208 (H) 11/29/2016 0729   TRIG 217 (H) 11/29/2016 0729   HDL 44 (L) 11/29/2016 0729   CHOLHDL 4.7 11/29/2016 0729   VLDL 43 (H) 11/29/2016 0729   LDLCALC 121 (H) 11/29/2016 0729     Assessment & Plan:   1. Uncontrolled type 2 diabetes mellitus with  complication.   She presents with controlled glycemic profile to near target levels.  Point-of-care A1c 7.1%, improving from 8.3%.  No documented or reported hypoglycemia.  - She  remains at a high risk for more acute and chronic complications of diabetes which include CAD, CVA, CKD, retinopathy, and neuropathy. These are all discussed in detail with the patient.    Recent labs reviewed.   - I have re-counseled the patient on diet  management and weight loss  by adopting a carbohydrate restricted / protein rich  Diet.  -She made significant changes in her diet.  - she  admits there is a room for improvement in her diet and drink choices. -  Suggestion is made for her to avoid simple carbohydrates  from her diet including Cakes, Sweet Desserts / Pastries, Ice Cream, Soda (diet and regular), Sweet Tea, Candies, Chips, Cookies, Sweet Pastries,  Store Bought Juices, Alcohol in Excess of  1-2 drinks a day, Artificial Sweeteners, Coffee Creamer, and "Sugar-free" Products. This will help patient to have stable blood glucose profile and potentially avoid unintended weight gain.  - Patient is advised to stick to a routine mealtimes to eat 3 meals  a day and avoid unnecessary snacks ( to snack only to correct hypoglycemia).  - I have approached patient with the following individualized plan to manage diabetes and patient agrees.  -Based on her presentation with controlled glycemic profile, she will not need bolus insulin for now.   -She is advised to continue Lantus 70 units nightly,  associated with strict monitoring of blood glucose at least once a day in the morning before breakfast, and at any other time as needed.    -  She is advised to continue Trulicity 1.5 mg subcutaneously weekly.     -She has tolerated glipizide, advised to continue glipizide 5 mg XL p.o. daily at breakfast.  - Patient specific target  for A1c; LDL, HDL, Triglycerides, and  Waist Circumference were discussed in  detail.  2) BP/HTN: -Her blood pressure is controlled to target.   She is advised to continue enalapril 10 mg p.o. daily.   3) Lipids/HPL: Her recent lipid panel showed increased LDL at 121 from 87.   She did not tolerate statins.  Her insurance did not provide coverage for Repatha, Lovaza.  I discussed and prescribed Zetia 10 mg p.o. nightly.  She will be considered for fasting lipid panel before next visit.   4)  Weight/Diet: Her BMI is 43.  She is a candidate for modest weight loss.  Exercise, and carbohydrates information provided. - She is hesitant to consider bariatric surgery.    5) Chronic Care/Health Maintenance:  -Patient is on ACEI/ARB and Statin medications and encouraged to continue to follow up with Ophthalmology, Podiatrist at least yearly or according to recommendations, and advised to  stay away from smoking. I have recommended yearly flu vaccine and pneumonia vaccination at least every 5 years; moderate intensity exercise for up to 150 minutes weekly; and  sleep for at least 7 hours a day.  - I advised patient to maintain close follow up with Mikey Kirschner, MD for primary care needs.  - Time spent on this patient care encounter:  35 min, of which > 50% was spent in  counseling and the rest reviewing her blood glucose logs , discussing her hypoglycemia and hyperglycemia episodes, reviewing her current and  previous labs / studies  ( including abstraction from other facilities) and medications  doses and developing a  long term treatment plan and documenting her care.   Please refer to Patient Instructions for Blood Glucose Monitoring and Insulin/Medications Dosing Guide"  in media tab for additional information. Please  also refer to " Patient Self Inventory" in the Media  tab for reviewed elements of pertinent patient history.  Mearl Latin Lusher participated in the discussions, expressed understanding, and voiced agreement with the above plans.  All questions were answered to  her satisfaction.  she is encouraged to contact clinic should she have any questions or concerns prior to her return visit.    Follow up plan: -Return in about 4 months (around 09/04/2020) for F/U with Pre-visit Labs, Meter, Logs, A1c here., NV A1c in Office, NV Office Urine MA.  Glade Lloyd, MD Phone: [phone]  Fax: [phone]  -  This note was partially dictated with voice recognition software. Similar sounding words can be transcribed inadequately or may not  be corrected upon review.  05/05/2020, 6:25 PM

## 2020-05-05 NOTE — Patient Instructions (Signed)

## 2020-06-15 ENCOUNTER — Other Ambulatory Visit: Payer: Self-pay | Admitting: "Endocrinology

## 2020-06-21 DIAGNOSIS — L218 Other seborrheic dermatitis: Secondary | ICD-10-CM | POA: Diagnosis not present

## 2020-06-21 DIAGNOSIS — L718 Other rosacea: Secondary | ICD-10-CM | POA: Diagnosis not present

## 2020-07-19 ENCOUNTER — Other Ambulatory Visit: Payer: Self-pay | Admitting: "Endocrinology

## 2020-07-28 ENCOUNTER — Ambulatory Visit: Payer: BC Managed Care – PPO | Admitting: Gastroenterology

## 2020-09-02 DIAGNOSIS — Z794 Long term (current) use of insulin: Secondary | ICD-10-CM | POA: Diagnosis not present

## 2020-09-02 DIAGNOSIS — E1165 Type 2 diabetes mellitus with hyperglycemia: Secondary | ICD-10-CM | POA: Diagnosis not present

## 2020-09-02 DIAGNOSIS — E118 Type 2 diabetes mellitus with unspecified complications: Secondary | ICD-10-CM | POA: Diagnosis not present

## 2020-09-02 DIAGNOSIS — E782 Mixed hyperlipidemia: Secondary | ICD-10-CM | POA: Diagnosis not present

## 2020-09-03 LAB — COMPLETE METABOLIC PANEL WITH GFR
AG Ratio: 2 (calc) (ref 1.0–2.5)
ALT: 21 U/L (ref 6–29)
AST: 14 U/L (ref 10–35)
Albumin: 3.9 g/dL (ref 3.6–5.1)
Alkaline phosphatase (APISO): 86 U/L (ref 37–153)
BUN: 17 mg/dL (ref 7–25)
CO2: 30 mmol/L (ref 20–32)
Calcium: 9.1 mg/dL (ref 8.6–10.4)
Chloride: 104 mmol/L (ref 98–110)
Creat: 0.74 mg/dL (ref 0.50–0.99)
GFR, Est African American: 102 mL/min/{1.73_m2} (ref >=60)
GFR, Est Non African American: 88 mL/min/{1.73_m2} (ref >=60)
Globulin: 2 g/dL (calc) (ref 1.9–3.7)
Glucose, Bld: 133 mg/dL — ABNORMAL HIGH (ref 65–99)
Potassium: 3.9 mmol/L (ref 3.5–5.3)
Sodium: 140 mmol/L (ref 135–146)
Total Bilirubin: 0.4 mg/dL (ref 0.2–1.2)
Total Protein: 5.9 g/dL — ABNORMAL LOW (ref 6.1–8.1)

## 2020-09-03 LAB — LIPID PANEL
Cholesterol: 210 mg/dL — ABNORMAL HIGH (ref <200)
HDL: 48 mg/dL — ABNORMAL LOW (ref >=50)
LDL Cholesterol (Calc): 121 mg/dL (calc) — ABNORMAL HIGH
Non-HDL Cholesterol (Calc): 162 mg/dL (calc) — ABNORMAL HIGH (ref <130)
Total CHOL/HDL Ratio: 4.4 (calc) (ref <5.0)
Triglycerides: 265 mg/dL — ABNORMAL HIGH (ref <150)

## 2020-09-03 LAB — TSH: TSH: 2.02 mIU/L (ref 0.40–4.50)

## 2020-09-03 LAB — T4, FREE: Free T4: 0.9 ng/dL (ref 0.8–1.8)

## 2020-09-06 ENCOUNTER — Other Ambulatory Visit: Payer: Self-pay

## 2020-09-06 ENCOUNTER — Encounter: Payer: Self-pay | Admitting: Nurse Practitioner

## 2020-09-06 ENCOUNTER — Ambulatory Visit: Payer: BC Managed Care – PPO | Admitting: "Endocrinology

## 2020-09-06 ENCOUNTER — Ambulatory Visit (INDEPENDENT_AMBULATORY_CARE_PROVIDER_SITE_OTHER): Payer: BC Managed Care – PPO | Admitting: Nurse Practitioner

## 2020-09-06 VITALS — BP 144/82 | HR 91 | Ht 60.0 in | Wt 226.0 lb

## 2020-09-06 DIAGNOSIS — Z794 Long term (current) use of insulin: Secondary | ICD-10-CM | POA: Diagnosis not present

## 2020-09-06 DIAGNOSIS — I10 Essential (primary) hypertension: Secondary | ICD-10-CM

## 2020-09-06 DIAGNOSIS — E782 Mixed hyperlipidemia: Secondary | ICD-10-CM | POA: Diagnosis not present

## 2020-09-06 DIAGNOSIS — E1165 Type 2 diabetes mellitus with hyperglycemia: Secondary | ICD-10-CM | POA: Diagnosis not present

## 2020-09-06 DIAGNOSIS — IMO0002 Reserved for concepts with insufficient information to code with codable children: Secondary | ICD-10-CM

## 2020-09-06 DIAGNOSIS — E118 Type 2 diabetes mellitus with unspecified complications: Secondary | ICD-10-CM | POA: Diagnosis not present

## 2020-09-06 LAB — POCT GLYCOSYLATED HEMOGLOBIN (HGB A1C): Hemoglobin A1C: 7.1 % — AB (ref 4.0–5.6)

## 2020-09-06 LAB — POCT UA - MICROALBUMIN: Microalbumin Ur, POC: 10 mg/L

## 2020-09-06 MED ORDER — LANTUS SOLOSTAR 100 UNIT/ML ~~LOC~~ SOPN: PEN_INJECTOR | SUBCUTANEOUS | 2 refills | Status: DC

## 2020-09-06 MED ORDER — REPATHA 140 MG/ML ~~LOC~~ SOSY: 140.0000 mg | PREFILLED_SYRINGE | SUBCUTANEOUS | 3 refills | Status: DC

## 2020-09-06 NOTE — Patient Instructions (Signed)

## 2020-09-06 NOTE — Progress Notes (Signed)
09/06/2020                     Endocrinology follow-up note   Subjective:    Patient ID: Lisa Burns, female    DOB: 01-May-1961, PCP Mikey Kirschner, MD (Inactive)   Past Medical History:  Diagnosis Date  . Allergy   . Asthma   . Diabetes mellitus with neuropathy ([place])   . Diabetes mellitus without complication ([place])   . GERD (gastroesophageal reflux disease)   . Hypertension   . Microproteinuria   . Reflux   . Venous stasis    Past Surgical History:  Procedure Laterality Date  . CESAREAN SECTION    . COLONOSCOPY N/A 04/24/2016   Procedure: COLONOSCOPY;  Surgeon: Danie Binder, MD;  Location: [facility];  Service: Endoscopy;  Laterality: N/A;  8:30 Am  . COLONOSCOPY N/A 07/18/2019   Dr. Oneida Alar: 6 simple adenomas removed.  External/internal hemorrhoids.  Next colonoscopy in 3 years.  Marland Kitchen LAPAROSCOPIC NISSEN FUNDOPLICATION  7412  . OTHER SURGICAL HISTORY     gerd surgery  . POLYPECTOMY  07/18/2019   Procedure: POLYPECTOMY;  Surgeon: Danie Binder, MD;  Location: [facility];  Service: Endoscopy;;  . THROAT SURGERY  2000   Social History   Socioeconomic History  . Marital status: Married    Spouse name: Not on file  . Number of children: 1  . Years of education: HS  . Highest education level: Not on file  Occupational History  . Occupation: [employer]   Tobacco Use  . Smoking status: Former Smoker    Packs/day: 1.00    Years: 5.00    Pack years: 5.00    Types: Cigarettes  . Smokeless tobacco: Never Used  Vaping Use  . Vaping Use: Never used  Substance and Sexual Activity  . Alcohol use: Yes    Alcohol/week: 0.0 standard drinks    Comment: occasionally  . Drug use: No  . Sexual activity: Not on file  Other Topics Concern  . Not on file  Social History Narrative   Drinks 2 cans of soda a day    Social Determinants of Health   Financial Resource Strain:   . Difficulty of Paying Living Expenses: Not on file  Food Insecurity:   . Worried  About Charity fundraiser in the Last Year: Not on file  . Ran Out of Food in the Last Year: Not on file  Transportation Needs:   . Lack of Transportation (Medical): Not on file  . Lack of Transportation (Non-Medical): Not on file  Physical Activity:   . Days of Exercise per Week: Not on file  . Minutes of Exercise per Session: Not on file  Stress:   . Feeling of Stress : Not on file  Social Connections:   . Frequency of Communication with Friends and Family: Not on file  . Frequency of Social Gatherings with Friends and Family: Not on file  . Attends Religious Services: Not on file  . Active Member of Clubs or Organizations: Not on file  . Attends Archivist Meetings: Not on file  . Marital Status: Not on file   Outpatient Encounter Medications as of 09/06/2020  Medication Sig  . Azelaic Acid 15 % cream Apply 1 application topically daily. Apply to face  . clobetasol (TEMOVATE) 0.05 % external solution Apply 1 application topically 2 (two) times daily.  . enalapril (VASOTEC) 20 MG tablet TAKE ONE TABLET BY  MOUTH EVERY NIGHT AT BEDTIME  . fluticasone (FLONASE) 50 MCG/ACT nasal spray Place 1 spray into both nostrils daily. (Patient taking differently: Place 1 spray into both nostrils daily as needed for allergies. )  . fluticasone (FLOVENT HFA) 110 MCG/ACT inhaler INHALE TWO PUFFS INTO THE LUNGS TWICE DAILY  . furosemide (LASIX) 40 MG tablet TAKE ONE TABLET (40MG  TOTAL) BY MOUTH EVERY MORNING  . gabapentin (NEURONTIN) 400 MG capsule TAKE ONE (1) CAPSULE THREE (3) TIMES EACH DAY  . glipiZIDE (GLUCOTROL XL) 5 MG 24 hr tablet TAKE ONE (1) TABLET BY MOUTH EVERY DAY  . glucose blood test strip Use as instructed  . insulin glargine (LANTUS SOLOSTAR) 100 UNIT/ML Solostar Pen Inject 80 units SQ daily at bedtime  . lansoprazole (PREVACID) 15 MG capsule Take 1 capsule (15 mg total) by mouth daily.  Marland Kitchen loratadine (CLARITIN) 10 MG tablet Take 10 mg by mouth daily.   Marland Kitchen lubiprostone  (AMITIZA) 24 MCG capsule Take one capsule once to twice daily with meals for constipation  . nortriptyline (PAMELOR) 50 MG capsule TAKE TWO CAPSULES BY MOUTH AT BEDTIME  . PROAIR HFA 108 (90 Base) MCG/ACT inhaler INHALE TWO PUFFS INTO THE LUNGS EVERY SIX HOURS AS NEEDED (Patient taking differently: Inhale 2 puffs into the lungs every 6 (six) hours as needed for wheezing or shortness of breath. )  . TRULICITY 1.5 JJ/0.0XF SOPN ADMINISTER 1.5 MG UNDER THE SKIN EVERY MONDAY AS DIRECTED  . valACYclovir (VALTREX) 500 MG tablet Take 500 mg by mouth 2 (two) times daily as needed (cold sore flare ups).   . [DISCONTINUED] LANTUS SOLOSTAR 100 UNIT/ML Solostar Pen ADMINISTER 70 UNITS UNDER THE SKIN AT BEDTIME  . Evolocumab (REPATHA) 140 MG/ML SOSY Inject 140 mg into the skin every 14 (fourteen) days.   No facility-administered encounter medications on file as of 09/06/2020.   ALLERGIES: Allergies  Allergen Reactions  . Augmentin [Amoxicillin-Pot Clavulanate]     GI upset Did it involve swelling of the face/tongue/throat, SOB, or low BP? No Did it involve sudden or severe rash/hives, skin peeling, or any reaction on the inside of your mouth or nose? No Did you need to seek medical attention at a hospital or doctor's office? No When did it last happen?15 years If all above answers are "NO", may proceed with cephalosporin use.   . Cinnamon Hives and Swelling  . Coconut Flavor Hives and Swelling  . Januvia [Sitagliptin]     GI upset  . Metformin And Related     GI upset  . Pravastatin     Pain in joints  . Strawberry (Diagnostic) Hives and Swelling   VACCINATION STATUS: Immunization History  Administered Date(s) Administered  . Influenza, Seasonal, Injecte, Preservative Fre 08/09/2016  . Influenza,inj,Quad PF,6+ Mos 12/27/2017  . Pneumococcal Polysaccharide-23 12/30/2013    Diabetes She presents for her follow-up diabetic visit. She has type 2 diabetes mellitus. Onset time: She was  diagnosed at approximate age of 50 years. Her disease course has been stable. There are no hypoglycemic associated symptoms. Pertinent negatives for hypoglycemia include no confusion, headaches, pallor or seizures. There are no diabetic associated symptoms. Pertinent negatives for diabetes include no chest pain, no polydipsia, no polyphagia and no polyuria. There are no hypoglycemic complications. Symptoms are stable. There are no diabetic complications. Risk factors for coronary artery disease include diabetes mellitus, dyslipidemia, family history, hypertension, obesity, sedentary lifestyle and tobacco exposure. Current diabetic treatment includes insulin injections and oral agent (monotherapy). She is compliant with treatment most  of the time. Her weight is increasing steadily. She is following a generally unhealthy diet. When asked about meal planning, she reported none. She has not had a previous visit with a dietitian. She never participates in exercise. Her home blood glucose trend is fluctuating minimally. Her breakfast blood glucose range is generally 140-180 mg/dl. Her overall blood glucose range is 140-180 mg/dl. (She presents today without her meter or logs to review.  She does report her 14- day average is around 148 and her 30-day average is around 154, consistent with her POCT A1C of 7.1% today, unchanged from last visit.  She denies any significant hypoglycemia.) An ACE inhibitor/angiotensin II receptor blocker is being taken. She sees a podiatrist.Eye exam is current.  Hyperlipidemia This is a chronic problem. The current episode started more than 1 year ago. The problem is uncontrolled. Recent lipid tests were reviewed and are high. Exacerbating diseases include chronic renal disease, diabetes and obesity. Factors aggravating her hyperlipidemia include fatty foods. Pertinent negatives include no chest pain, myalgias or shortness of breath. She is currently on no antihyperlipidemic treatment (she  does not tolerate statins). The current treatment provides no improvement of lipids. Compliance problems include medication side effects, adherence to diet and adherence to exercise.  Risk factors for coronary artery disease include diabetes mellitus, dyslipidemia, hypertension, obesity and a sedentary lifestyle.  Hypertension This is a chronic problem. The current episode started more than 1 year ago. The problem has been gradually worsening since onset. The problem is uncontrolled. Associated symptoms include peripheral edema. Pertinent negatives include no chest pain, headaches, palpitations or shortness of breath. There are no associated agents to hypertension. Risk factors for coronary artery disease include dyslipidemia, diabetes mellitus, obesity and sedentary lifestyle. Past treatments include ACE inhibitors and diuretics. The current treatment provides mild improvement. Compliance problems include diet and exercise.  Hypertensive end-organ damage includes kidney disease. Identifiable causes of hypertension include chronic renal disease.     Review of systems  Constitutional: + Minimally fluctuating body weight,  current  Body mass index is 44.14 kg/m. , no fatigue, no subjective hyperthermia, no subjective hypothermia Eyes: no blurry vision, no xerophthalmia ENT: no sore throat, no nodules palpated in throat, no dysphagia/odynophagia, no hoarseness Cardiovascular: no Chest Pain, no Shortness of Breath, no palpitations, + mild intermittent leg swelling Respiratory: no cough, no shortness of breath Gastrointestinal: no Nausea/Vomiting/Diarrhea Musculoskeletal: no muscle/joint aches Skin: no rashes, no hyperemia Neurological: no tremors, no numbness, no tingling, no dizziness Psychiatric: no depression, no anxiety    Objective:    BP (!) 144/82 (BP Location: Right Arm, Patient Position: Sitting)   Pulse 91   Ht 5' (1.524 m)   Wt 226 lb (102.5 kg)   BMI 44.14 kg/m   Wt Readings  from Last 3 Encounters:  09/06/20 226 lb (102.5 kg)  05/05/20 221 lb (100.2 kg)  02/17/20 222 lb 9.6 oz (101 kg)    BP Readings from Last 3 Encounters:  09/06/20 (!) 144/82  05/05/20 134/75  02/17/20 (!) 142/90    Physical Exam- Limited  Constitutional:  Body mass index is 44.14 kg/m. , not in acute distress, normal state of mind Eyes:  EOMI, no exophthalmos Neck: Supple Thyroid: No gross goiter Cardiovascular: RRR, no murmers, rubs, or gallops, no edema Respiratory: Adequate breathing efforts, no crackles, rales, rhonchi, or wheezing Musculoskeletal: no gross deformities, strength intact in all four extremities, no gross restriction of joint movements Skin:  no rashes, no hyperemia Neurological: no tremor with outstretched hands  Foot exam:   No rashes, ulcers, cuts, onychodystrophy.   Good pulses bilat.  Good sensation to 10 g monofilament bilat.  + calluses to bilateral feet (sees podiatry for this)   Results for orders placed or performed in visit on 09/06/20  POCT UA - Microalbumin  Result Value Ref Range   Microalbumin Ur, POC 10 mg/L   Creatinine, POC     Albumin/Creatinine Ratio, Urine, POC    HgB A1c  Result Value Ref Range   Hemoglobin A1C 7.1 (A) 4.0 - 5.6 %   HbA1c POC (<> result, manual entry)     HbA1c, POC (prediabetic range)     HbA1c, POC (controlled diabetic range)     Diabetic Labs (most recent): Lab Results  Component Value Date   HGBA1C 7.1 (A) 09/06/2020   HGBA1C 7.1 (A) 05/05/2020   HGBA1C 7.9 (A) 01/06/2020   Lipid Panel     Component Value Date/Time   CHOL 210 (H) 09/02/2020 0707   TRIG 265 (H) 09/02/2020 0707   HDL 48 (L) 09/02/2020 0707   CHOLHDL 4.4 09/02/2020 0707   VLDL 43 (H) 11/29/2016 0729   LDLCALC 121 (H) 09/02/2020 0707     Assessment & Plan:   1. Uncontrolled type 2 diabetes mellitus with complication.  She presents today without her meter or logs to review.  She does report her 14- day average is around 148  and her 30-day average is around 154, consistent with her POCT A1C of 7.1% today, unchanged from last visit.  She denies any significant hypoglycemia.  - She  remains at a high risk for more acute and chronic complications of diabetes which include CAD, CVA, CKD, retinopathy, and neuropathy. These are all discussed in detail with the patient.    Recent labs reviewed.  - Nutritional counseling repeated at each appointment due to patients tendency to fall back in to old habits.  - The patient admits there is a room for improvement in their diet and drink choices. -  Suggestion is made for the patient to avoid simple carbohydrates from their diet including Cakes, Sweet Desserts / Pastries, Ice Cream, Soda (diet and regular), Sweet Tea, Candies, Chips, Cookies, Sweet Pastries,  Store Bought Juices, Alcohol in Excess of  1-2 drinks a day, Artificial Sweeteners, Coffee Creamer, and "Sugar-free" Products. This will help patient to have stable blood glucose profile and potentially avoid unintended weight gain.   - I encouraged the patient to switch to  unprocessed or minimally processed complex starch and increased protein intake (animal or plant source), fruits, and vegetables.   - Patient is advised to stick to a routine mealtimes to eat 3 meals  a day and avoid unnecessary snacks ( to snack only to correct hypoglycemia).  - I have approached patient with the following individualized plan to manage diabetes and patient agrees.  -Based on her near target fasting glycemic profile, she will benefit from slight increase in her Lantus to 80 units SQ daily at bedtime for her to achieve control of her diabetes to target.  -She is advised to continue Trulicity 1.5 mg SQ weekly and Glipizide 5 mg XL daily with breakfast.  -She is encouraged to continue monitoring blood glucose at least twice daily, before breakfast and before bed, and call the clinic if she has readings less than 70 or greater than 200 for 3  tests in a row.  - Patient specific target  for A1c; LDL, HDL, Triglycerides, and  Waist Circumference were discussed in  detail.  2) BP/HTN: Her blood pressure is not controlled to target.  She is advised to continue Enalapril 20 mg po daily and Lasix 40 mg po daily.  3) Lipids/HPL:  Her recent lipid panel from 09/02/20 shows uncontrolled LDL of 121 and elevated triglycerides of 265.  She does not tolerate statins and does not take any medications for this.  She is willing to try Repatha.  Rx sent in for Repatha to pharmacy.  4)  Weight/Diet:  Her Body mass index is 44.14 kg/m.--  She is a candidate for modest weight loss.  Exercise, and carbohydrates information provided.  5) Chronic Care/Health Maintenance: -Patient is on ACEI/ARB medications and encouraged to continue to follow up with Ophthalmology, Podiatrist at least yearly or according to recommendations, and advised to  stay away from smoking. I have recommended yearly flu vaccine and pneumonia vaccination at least every 5 years; moderate intensity exercise for up to 150 minutes weekly; and  sleep for at least 7 hours a day.  - I advised patient to maintain close follow up with Mikey Kirschner, MD (Inactive) for primary care needs.  - Time spent on this patient care encounter:  35 min, of which > 50% was spent in  counseling and the rest reviewing her blood glucose logs , discussing her hypoglycemia and hyperglycemia episodes, reviewing her current and  previous labs / studies  ( including abstraction from other facilities) and medications  doses and developing a  long term treatment plan and documenting her care.   Please refer to Patient Instructions for Blood Glucose Monitoring and Insulin/Medications Dosing Guide"  in media tab for additional information. Please  also refer to " Patient Self Inventory" in the Media  tab for reviewed elements of pertinent patient history.  Mearl Latin Trela participated in the discussions, expressed  understanding, and voiced agreement with the above plans.  All questions were answered to her satisfaction. she is encouraged to contact clinic should she have any questions or concerns prior to her return visit.    Follow up plan: -Return in about 4 months (around 01/07/2021) for Diabetes follow up with A1c in office, No previsit labs, Bring glucometer and logs.  Rayetta Pigg, [facility] [address] [place], [place] [address] Phone: [phone] Fax: [phone]  09/06/2020, 4:31 PM

## 2020-10-04 ENCOUNTER — Other Ambulatory Visit: Payer: Self-pay | Admitting: "Endocrinology

## 2020-10-06 ENCOUNTER — Other Ambulatory Visit: Payer: Self-pay | Admitting: Family Medicine

## 2020-10-08 NOTE — Telephone Encounter (Signed)
Patient has schedule appointment for 12/10 for medication follow up and needing medication sent in because she is out of medication

## 2020-10-29 ENCOUNTER — Other Ambulatory Visit: Payer: Self-pay

## 2020-10-29 ENCOUNTER — Encounter: Payer: Self-pay | Admitting: Family Medicine

## 2020-10-29 ENCOUNTER — Ambulatory Visit: Payer: BC Managed Care – PPO | Admitting: Family Medicine

## 2020-10-29 VITALS — BP 118/74 | HR 102 | Temp 97.8°F | Ht 60.0 in | Wt 220.0 lb

## 2020-10-29 DIAGNOSIS — J4540 Moderate persistent asthma, uncomplicated: Secondary | ICD-10-CM

## 2020-10-29 DIAGNOSIS — E1142 Type 2 diabetes mellitus with diabetic polyneuropathy: Secondary | ICD-10-CM | POA: Diagnosis not present

## 2020-10-29 DIAGNOSIS — I10 Essential (primary) hypertension: Secondary | ICD-10-CM | POA: Diagnosis not present

## 2020-10-29 DIAGNOSIS — E782 Mixed hyperlipidemia: Secondary | ICD-10-CM

## 2020-10-29 DIAGNOSIS — K148 Other diseases of tongue: Secondary | ICD-10-CM

## 2020-10-29 MED ORDER — LANSOPRAZOLE 15 MG PO CPDR: 15.0000 mg | DELAYED_RELEASE_CAPSULE | Freq: Every day | ORAL | 1 refills | Status: AC

## 2020-10-29 MED ORDER — GABAPENTIN 400 MG PO CAPS: ORAL_CAPSULE | ORAL | 1 refills | Status: AC

## 2020-10-29 MED ORDER — FLOVENT HFA 110 MCG/ACT IN AERO: INHALATION_SPRAY | RESPIRATORY_TRACT | 5 refills | Status: DC

## 2020-10-29 MED ORDER — ENALAPRIL MALEATE 20 MG PO TABS: 20.0000 mg | ORAL_TABLET | Freq: Every day | ORAL | 1 refills | Status: DC

## 2020-10-29 MED ORDER — ALBUTEROL SULFATE HFA 108 (90 BASE) MCG/ACT IN AERS: INHALATION_SPRAY | RESPIRATORY_TRACT | 5 refills | Status: DC

## 2020-10-29 MED ORDER — NORTRIPTYLINE HCL 50 MG PO CAPS: ORAL_CAPSULE | ORAL | 1 refills | Status: DC

## 2020-10-29 MED ORDER — FUROSEMIDE 40 MG PO TABS: ORAL_TABLET | ORAL | 1 refills | Status: DC

## 2020-10-29 NOTE — Progress Notes (Signed)
Patient ID: Lisa Burns, female    DOB: [birth year], 59 y.o.   MRN: [id number]   Chief Complaint  Patient presents with  . Med check   . Hypertension  . Hyperlipidemia  . Asthma   Subjective:    HPI  F/u htn, hld, dm2, and asthma and med check up. Pt states no concerns today.  Sees Dr. Dorris Fetch for diabetes.  Been feeling well in past year.  No new concerns. dm2- going well with sugars.  Taking trulicity, lantus, glipizide,  Last a1c 7.1  Pt having neuropathy in feet taking nortriptyline 50mg  qhs.  Taking lasix for blood pressure and leg swelling, mild.  Seeing Dr. Katy Fitch- for diabetic eye, no retinopathy.  Has appt in 1/22 to have gyn exam. Seeing Dr. Harlin Rain ob/gyn.  Taking repatha now due to elevated cholesterol and triglyerides.  Tried statins in past and didn't tolerate in past. Doing well with repatha.   Has tongue lesion on left tongue on side.  hitting it repeatedly, thinking she bit it last week.  Took valtrex and thought would help.  Not seeing a difference. Noticing it 10 days ago.  Medical History Demaris has a past medical history of Allergy, Asthma, Diabetes mellitus with neuropathy ([place]), Diabetes mellitus without complication ([place]), GERD (gastroesophageal reflux disease), Hypertension, Microproteinuria, Reflux, and Venous stasis.   Outpatient Encounter Medications as of 10/29/2020  Medication Sig  . Azelaic Acid 15 % cream Apply 1 application topically daily. Apply to face  . clobetasol (TEMOVATE) 0.05 % external solution Apply 1 application topically 2 (two) times daily.  . Evolocumab (REPATHA) 140 MG/ML SOSY Inject 140 mg into the skin every 14 (fourteen) days.  . fluticasone (FLONASE) 50 MCG/ACT nasal spray Place 1 spray into both nostrils daily. (Patient taking differently: Place 1 spray into both nostrils daily as needed for allergies.)  . glipiZIDE (GLUCOTROL XL) 5 MG 24 hr tablet TAKE ONE (1) TABLET BY MOUTH EVERY DAY  . glucose blood  test strip Use as instructed  . insulin glargine (LANTUS SOLOSTAR) 100 UNIT/ML Solostar Pen Inject 80 units SQ daily at bedtime  . loratadine (CLARITIN) 10 MG tablet Take 10 mg by mouth daily.   . TRULICITY 1.5 PI/9.5JO SOPN ADMINISTER 1.5 MG UNDER THE SKIN EVERY MONDAY AS DIRECTED  . valACYclovir (VALTREX) 500 MG tablet Take 500 mg by mouth 2 (two) times daily as needed (cold sore flare ups).   . [DISCONTINUED] enalapril (VASOTEC) 20 MG tablet TAKE ONE TABLET BY MOUTH EVERY NIGHT AT BEDTIME  . [DISCONTINUED] furosemide (LASIX) 40 MG tablet TAKE ONE TABLET (40MG  TOTAL) BY MOUTH EVERY MORNING  . [DISCONTINUED] gabapentin (NEURONTIN) 400 MG capsule TAKE ONE (1) CAPSULE THREE (3) TIMES EACH DAY  . [DISCONTINUED] lansoprazole (PREVACID) 15 MG capsule Take 1 capsule (15 mg total) by mouth daily.  . [DISCONTINUED] nortriptyline (PAMELOR) 50 MG capsule TAKE TWO CAPSULES BY MOUTH AT BEDTIME  . [DISCONTINUED] PROAIR HFA 108 (90 Base) MCG/ACT inhaler INHALE TWO PUFFS INTO THE LUNGS EVERY SIX HOURS AS NEEDED (Patient taking differently: Inhale 2 puffs into the lungs every 6 (six) hours as needed for wheezing or shortness of breath.)  . albuterol (PROAIR HFA) 108 (90 Base) MCG/ACT inhaler INHALE TWO PUFFS INTO THE LUNGS EVERY SIX HOURS AS NEEDED  . enalapril (VASOTEC) 20 MG tablet Take 1 tablet (20 mg total) by mouth at bedtime.  . fluticasone (FLOVENT HFA) 110 MCG/ACT inhaler INHALE TWO PUFFS INTO THE LUNGS TWICE DAILY  . furosemide (LASIX)  40 MG tablet TAKE ONE TABLET (40MG  TOTAL) BY MOUTH EVERY MORNING  . gabapentin (NEURONTIN) 400 MG capsule TAKE ONE (1) CAPSULE THREE (3) TIMES EACH DAY  . lansoprazole (PREVACID) 15 MG capsule Take 1 capsule (15 mg total) by mouth daily.  . nortriptyline (PAMELOR) 50 MG capsule TAKE TWO CAPSULES BY MOUTH AT BEDTIME  . [DISCONTINUED] fluticasone (FLOVENT HFA) 110 MCG/ACT inhaler INHALE TWO PUFFS INTO THE LUNGS TWICE DAILY  . [DISCONTINUED] lubiprostone (AMITIZA) 24 MCG  capsule Take one capsule once to twice daily with meals for constipation   No facility-administered encounter medications on file as of 10/29/2020.     Review of Systems  Constitutional: Negative for chills and fever.  HENT: Negative for congestion, mouth sores, rhinorrhea and sore throat.        +tongue lesion  Respiratory: Negative for cough, shortness of breath and wheezing.   Cardiovascular: Negative for chest pain and leg swelling.  Gastrointestinal: Negative for abdominal pain, diarrhea, nausea and vomiting.  Genitourinary: Negative for dysuria and frequency.  Musculoskeletal: Negative for arthralgias and back pain.  Skin: Negative for rash.  Neurological: Negative for dizziness, weakness and headaches.     Vitals BP 118/74   Pulse (!) 102   Temp 97.8 F (36.6 C)   Ht 5' (1.524 m)   Wt 220 lb (99.8 kg)   SpO2 94%   BMI 42.97 kg/m   Objective:   Physical Exam Vitals and nursing note reviewed.  Constitutional:      Appearance: Normal appearance.  HENT:     Head: Normocephalic and atraumatic.     Nose: Nose normal.     Mouth/Throat:     Mouth: Mucous membranes are dry.     Pharynx: Oropharynx is clear.     Comments: +small laceration to the left tongue, no bleeding. Eyes:     Extraocular Movements: Extraocular movements intact.     Conjunctiva/sclera: Conjunctivae normal.     Pupils: Pupils are equal, round, and reactive to light.  Cardiovascular:     Rate and Rhythm: Regular rhythm. Tachycardia present.     Pulses: Normal pulses.     Heart sounds: Normal heart sounds.  Pulmonary:     Effort: Pulmonary effort is normal.     Breath sounds: Normal breath sounds. No wheezing, rhonchi or rales.  Musculoskeletal:        General: Normal range of motion.     Right lower leg: No edema.     Left lower leg: No edema.  Skin:    General: Skin is warm and dry.     Findings: No lesion or rash.  Neurological:     General: No focal deficit present.     Mental  Status: She is alert and oriented to person, place, and time.     Cranial Nerves: No cranial nerve deficit.  Psychiatric:        Mood and Affect: Mood normal.        Behavior: Behavior normal.        Thought Content: Thought content normal.        Judgment: Judgment normal.      Assessment and Plan   1. DM type 2 with diabetic peripheral neuropathy (HCC) - gabapentin (NEURONTIN) 400 MG capsule; TAKE ONE (1) CAPSULE THREE (3) TIMES EACH DAY  Dispense: 270 capsule; Refill: 1 - nortriptyline (PAMELOR) 50 MG capsule; TAKE TWO CAPSULES BY MOUTH AT BEDTIME  Dispense: 180 capsule; Refill: 1  2. Essential hypertension, benign - enalapril (VASOTEC)  20 MG tablet; Take 1 tablet (20 mg total) by mouth at bedtime.  Dispense: 90 tablet; Refill: 1 - furosemide (LASIX) 40 MG tablet; TAKE ONE TABLET (40MG  TOTAL) BY MOUTH EVERY MORNING  Dispense: 90 tablet; Refill: 1  3. Morbid obesity ([place])  4. Mixed hyperlipidemia  5. Moderate persistent asthma without complication - albuterol (PROAIR HFA) 108 (90 Base) MCG/ACT inhaler; INHALE TWO PUFFS INTO THE LUNGS EVERY SIX HOURS AS NEEDED  Dispense: 8.5 g; Refill: 5 - fluticasone (FLOVENT HFA) 110 MCG/ACT inhaler; INHALE TWO PUFFS INTO THE LUNGS TWICE DAILY  Dispense: 12 g; Refill: 5  6. Tongue lesion    Pt doing well.  Dm2- seeing endo, Dr. Dorris Fetch. a1c at 7.1.   Neuropathy- stable. Cont with pamelor and gabapentin.  Asthma- stable, controlled. Cont meds.  hld- cont repatha and f/u with cards. Labs done in 10/21 reviewed.   htn- stable, cont meds.  Tongue lesion- f/u with oral surgery if not improving in the next week.   F/u 8mo or prn.

## 2020-11-08 ENCOUNTER — Other Ambulatory Visit: Payer: Self-pay | Admitting: "Endocrinology

## 2020-11-23 ENCOUNTER — Telehealth: Payer: Self-pay

## 2020-11-23 ENCOUNTER — Other Ambulatory Visit: Payer: Self-pay

## 2020-11-23 DIAGNOSIS — E1165 Type 2 diabetes mellitus with hyperglycemia: Secondary | ICD-10-CM

## 2020-11-23 DIAGNOSIS — IMO0002 Reserved for concepts with insufficient information to code with codable children: Secondary | ICD-10-CM

## 2020-11-23 MED ORDER — LANTUS SOLOSTAR 100 UNIT/ML ~~LOC~~ SOPN: PEN_INJECTOR | SUBCUTANEOUS | 2 refills | Status: DC

## 2020-11-23 NOTE — Telephone Encounter (Signed)
Tammy at [facility] left a VM that they need a new RX for patient for her Lantus

## 2020-11-23 NOTE — Telephone Encounter (Signed)
Sent in

## 2020-11-24 NOTE — Telephone Encounter (Signed)
Called [facility] with verbal order for lantus 80 units daily at bedtime, quantity 85ml.

## 2020-11-24 NOTE — Telephone Encounter (Signed)
Tammy called in regards to this and left a voicemail stating they did not receive this. She can be reached at [phone]   [facility] - [place], [place] - [address] Phone:  [phone]  Fax:  [phone]

## 2020-12-24 ENCOUNTER — Other Ambulatory Visit: Payer: Self-pay | Admitting: "Endocrinology

## 2020-12-24 DIAGNOSIS — IMO0002 Reserved for concepts with insufficient information to code with codable children: Secondary | ICD-10-CM

## 2020-12-24 DIAGNOSIS — E1165 Type 2 diabetes mellitus with hyperglycemia: Secondary | ICD-10-CM

## 2021-01-04 ENCOUNTER — Other Ambulatory Visit: Payer: Self-pay | Admitting: "Endocrinology

## 2021-01-10 ENCOUNTER — Other Ambulatory Visit: Payer: Self-pay

## 2021-01-10 ENCOUNTER — Ambulatory Visit (INDEPENDENT_AMBULATORY_CARE_PROVIDER_SITE_OTHER): Payer: BC Managed Care – PPO | Admitting: Nurse Practitioner

## 2021-01-10 ENCOUNTER — Encounter: Payer: Self-pay | Admitting: Nurse Practitioner

## 2021-01-10 VITALS — BP 150/76 | HR 78 | Ht 60.0 in | Wt 217.2 lb

## 2021-01-10 DIAGNOSIS — E782 Mixed hyperlipidemia: Secondary | ICD-10-CM

## 2021-01-10 DIAGNOSIS — IMO0002 Reserved for concepts with insufficient information to code with codable children: Secondary | ICD-10-CM

## 2021-01-10 DIAGNOSIS — E1165 Type 2 diabetes mellitus with hyperglycemia: Secondary | ICD-10-CM

## 2021-01-10 DIAGNOSIS — Z794 Long term (current) use of insulin: Secondary | ICD-10-CM

## 2021-01-10 DIAGNOSIS — I10 Essential (primary) hypertension: Secondary | ICD-10-CM | POA: Diagnosis not present

## 2021-01-10 DIAGNOSIS — E118 Type 2 diabetes mellitus with unspecified complications: Secondary | ICD-10-CM | POA: Diagnosis not present

## 2021-01-10 LAB — POCT GLYCOSYLATED HEMOGLOBIN (HGB A1C): HbA1c, POC (controlled diabetic range): 6 % (ref 0.0–7.0)

## 2021-01-10 MED ORDER — GLIPIZIDE ER 5 MG PO TB24: 5.0000 mg | ORAL_TABLET | Freq: Every day | ORAL | 3 refills | Status: DC

## 2021-01-10 MED ORDER — TRULICITY 1.5 MG/0.5ML ~~LOC~~ SOAJ: 1.5000 mg | SUBCUTANEOUS | 3 refills | Status: DC

## 2021-01-10 MED ORDER — LANTUS SOLOSTAR 100 UNIT/ML ~~LOC~~ SOPN
80.0000 [IU] | PEN_INJECTOR | Freq: Every day | SUBCUTANEOUS | 3 refills | Status: DC
Start: 2021-01-10 — End: 2021-07-11

## 2021-01-10 NOTE — Progress Notes (Signed)
01/10/2021                     Endocrinology follow-up note   Subjective:    Patient ID: Lisa Burns, female    DOB: 1961/01/26, PCP Erven Colla, DO   Past Medical History:  Diagnosis Date  . Allergy   . Asthma   . Diabetes mellitus with neuropathy ([place])   . Diabetes mellitus without complication ([place])   . GERD (gastroesophageal reflux disease)   . Hypertension   . Microproteinuria   . Reflux   . Venous stasis    Past Surgical History:  Procedure Laterality Date  . CESAREAN SECTION    . COLONOSCOPY N/A 04/24/2016   Procedure: COLONOSCOPY;  Surgeon: Danie Binder, MD;  Location: [facility];  Service: Endoscopy;  Laterality: N/A;  8:30 Am  . COLONOSCOPY N/A 07/18/2019   Dr. Oneida Alar: 6 simple adenomas removed.  External/internal hemorrhoids.  Next colonoscopy in 3 years.  Marland Kitchen LAPAROSCOPIC NISSEN FUNDOPLICATION  1025  . OTHER SURGICAL HISTORY     gerd surgery  . POLYPECTOMY  07/18/2019   Procedure: POLYPECTOMY;  Surgeon: Danie Binder, MD;  Location: [facility];  Service: Endoscopy;;  . THROAT SURGERY  2000   Social History   Socioeconomic History  . Marital status: Married    Spouse name: Not on file  . Number of children: 1  . Years of education: HS  . Highest education level: Not on file  Occupational History  . Occupation: [employer]   Tobacco Use  . Smoking status: Former Smoker    Packs/day: 1.00    Years: 5.00    Pack years: 5.00    Types: Cigarettes  . Smokeless tobacco: Never Used  Vaping Use  . Vaping Use: Never used  Substance and Sexual Activity  . Alcohol use: Yes    Alcohol/week: 0.0 standard drinks    Comment: occasionally  . Drug use: No  . Sexual activity: Not on file  Other Topics Concern  . Not on file  Social History Narrative   Drinks 2 cans of soda a day    Social Determinants of Health   Financial Resource Strain: Not on file  Food Insecurity: Not on file  Transportation Needs: Not on file  Physical Activity:  Not on file  Stress: Not on file  Social Connections: Not on file   Outpatient Encounter Medications as of 01/10/2021  Medication Sig  . albuterol (PROAIR HFA) 108 (90 Base) MCG/ACT inhaler INHALE TWO PUFFS INTO THE LUNGS EVERY SIX HOURS AS NEEDED  . Azelaic Acid 15 % cream Apply 1 application topically daily. Apply to face  . clobetasol (TEMOVATE) 0.05 % external solution Apply 1 application topically 2 (two) times daily.  . enalapril (VASOTEC) 20 MG tablet Take 1 tablet (20 mg total) by mouth at bedtime.  . Evolocumab (REPATHA) 140 MG/ML SOSY Inject 140 mg into the skin every 14 (fourteen) days.  . fluticasone (FLONASE) 50 MCG/ACT nasal spray Place 1 spray into both nostrils daily. (Patient taking differently: Place 1 spray into both nostrils daily as needed for allergies.)  . fluticasone (FLOVENT HFA) 110 MCG/ACT inhaler INHALE TWO PUFFS INTO THE LUNGS TWICE DAILY  . furosemide (LASIX) 40 MG tablet TAKE ONE TABLET (40MG  TOTAL) BY MOUTH EVERY MORNING  . gabapentin (NEURONTIN) 400 MG capsule TAKE ONE (1) CAPSULE THREE (3) TIMES EACH DAY  . glucose blood test strip Use as instructed  . lansoprazole (PREVACID) 15  MG capsule Take 1 capsule (15 mg total) by mouth daily.  Marland Kitchen loratadine (CLARITIN) 10 MG tablet Take 10 mg by mouth daily.   . nortriptyline (PAMELOR) 50 MG capsule TAKE TWO CAPSULES BY MOUTH AT BEDTIME  . valACYclovir (VALTREX) 500 MG tablet Take 500 mg by mouth 2 (two) times daily as needed (cold sore flare ups).   . [DISCONTINUED] glipiZIDE (GLUCOTROL XL) 5 MG 24 hr tablet TAKE ONE (1) TABLET BY MOUTH EVERY DAY  . [DISCONTINUED] insulin glargine (LANTUS SOLOSTAR) 100 UNIT/ML Solostar Pen INJECT 80 UNITS SUBCUTANEOUSLY AT BEDTIME.  . [DISCONTINUED] TRULICITY 1.5 RF/1.6BW SOPN ADMINISTER 1.5 MG UNDER THE SKIN EVERY MONDAY AS DIRECTED  . Dulaglutide (TRULICITY) 1.5 GY/6.5LD SOPN Inject 1.5 mg into the skin once a week.  Marland Kitchen glipiZIDE (GLUCOTROL XL) 5 MG 24 hr tablet Take 1 tablet (5 mg  total) by mouth daily with breakfast.  . insulin glargine (LANTUS SOLOSTAR) 100 UNIT/ML Solostar Pen Inject 80 Units into the skin at bedtime. INJECT 80 UNITS SUBCUTANEOUSLY AT BEDTIME.  . [DISCONTINUED] REPATHA SURECLICK 357 MG/ML SOAJ [id number] Milligram(s) SUB-Q Every 2 Weeks   No facility-administered encounter medications on file as of 01/10/2021.   ALLERGIES: Allergies  Allergen Reactions  . Augmentin [Amoxicillin-Pot Clavulanate]     GI upset Did it involve swelling of the face/tongue/throat, SOB, or low BP? No Did it involve sudden or severe rash/hives, skin peeling, or any reaction on the inside of your mouth or nose? No Did you need to seek medical attention at a hospital or doctor's office? No When did it last happen?15 years If all above answers are "NO", may proceed with cephalosporin use.   . Cinnamon Hives and Swelling  . Coconut Flavor Hives and Swelling  . Januvia [Sitagliptin]     GI upset  . Metformin And Related     GI upset  . Pravastatin     Pain in joints  . Strawberry (Diagnostic) Hives and Swelling   VACCINATION STATUS: Immunization History  Administered Date(s) Administered  . Influenza, Seasonal, Injecte, Preservative Fre 08/09/2016  . Influenza,inj,Quad PF,6+ Mos 12/27/2017  . PFIZER(Purple Top)SARS-COV-2 Vaccination 03/11/2020, 04/11/2020  . Pneumococcal Polysaccharide-23 12/30/2013    Diabetes She presents for her follow-up diabetic visit. She has type 2 diabetes mellitus. Onset time: She was diagnosed at approximate age of 21 years. Her disease course has been improving. There are no hypoglycemic associated symptoms. Pertinent negatives for hypoglycemia include no confusion, headaches, pallor or seizures. There are no diabetic associated symptoms. Pertinent negatives for diabetes include no chest pain, no polydipsia, no polyphagia and no polyuria. There are no hypoglycemic complications. Symptoms are stable. There are no diabetic  complications. Risk factors for coronary artery disease include diabetes mellitus, dyslipidemia, family history, hypertension, obesity, sedentary lifestyle and tobacco exposure. Current diabetic treatment includes insulin injections and oral agent (monotherapy). She is compliant with treatment most of the time. Her weight is decreasing steadily. She is following a generally unhealthy diet. When asked about meal planning, she reported none. She has not had a previous visit with a dietitian. She never participates in exercise. Her home blood glucose trend is decreasing steadily. Her breakfast blood glucose range is generally 110-130 mg/dl. (She presents today with her logs, no meter, showing stable and at goal fasting glycemic profile.  Her POCT A1c today is 6%, improving since last visit of 7.1%.  She denies any hypoglycemia.) An ACE inhibitor/angiotensin II receptor blocker is being taken. She sees a podiatrist.Eye exam is current.  Hyperlipidemia  This is a chronic problem. The current episode started more than 1 year ago. The problem is uncontrolled. Recent lipid tests were reviewed and are high. Exacerbating diseases include chronic renal disease, diabetes and obesity. Factors aggravating her hyperlipidemia include fatty foods. Pertinent negatives include no chest pain, myalgias or shortness of breath. She is currently on no antihyperlipidemic treatment (she does not tolerate statins). The current treatment provides no improvement of lipids. Compliance problems include medication side effects, adherence to diet and adherence to exercise.  Risk factors for coronary artery disease include diabetes mellitus, dyslipidemia, hypertension, obesity and a sedentary lifestyle.  Hypertension This is a chronic problem. The current episode started more than 1 year ago. The problem has been waxing and waning since onset. The problem is uncontrolled. Associated symptoms include peripheral edema. Pertinent negatives include  no chest pain, headaches, palpitations or shortness of breath. There are no associated agents to hypertension. Risk factors for coronary artery disease include dyslipidemia, diabetes mellitus, obesity, sedentary lifestyle and post-menopausal state. Past treatments include ACE inhibitors and diuretics. The current treatment provides mild improvement. Compliance problems include diet and exercise.  Hypertensive end-organ damage includes kidney disease. Identifiable causes of hypertension include chronic renal disease.     Review of systems  Constitutional: + Minimally fluctuating body weight,  current  Body mass index is 42.42 kg/m. , no fatigue, no subjective hyperthermia, no subjective hypothermia Eyes: no blurry vision, no xerophthalmia ENT: no sore throat, no nodules palpated in throat, no dysphagia/odynophagia, no hoarseness Cardiovascular: no Chest Pain, no Shortness of Breath, no palpitations, + mild intermittent leg swelling Respiratory: no cough, no shortness of breath Gastrointestinal: no Nausea/Vomiting/Diarrhea Musculoskeletal: no muscle/joint aches Skin: no rashes, no hyperemia Neurological: no tremors, no numbness, no tingling, no dizziness Psychiatric: no depression, no anxiety    Objective:    BP (!) 150/76 (BP Location: Right Arm)   Pulse 78   Ht 5' (1.524 m)   Wt 217 lb 3.2 oz (98.5 kg)   BMI 42.42 kg/m   Wt Readings from Last 3 Encounters:  01/10/21 217 lb 3.2 oz (98.5 kg)  10/29/20 220 lb (99.8 kg)  09/06/20 226 lb (102.5 kg)    BP Readings from Last 3 Encounters:  01/10/21 (!) 150/76  10/29/20 118/74  09/06/20 (!) 144/82    Physical Exam- Limited  Constitutional:  Body mass index is 42.42 kg/m. , not in acute distress, normal state of mind Eyes:  EOMI, no exophthalmos Neck: Supple Thyroid: No gross goiter Cardiovascular: RRR, no murmers, rubs, or gallops, no edema Respiratory: Adequate breathing efforts, no crackles, rales, rhonchi, or  wheezing Musculoskeletal: no gross deformities, strength intact in all four extremities, no gross restriction of joint movements Skin:  no rashes, no hyperemia Neurological: no tremor with outstretched hands   POCT ABI Results 01/10/21   Right ABI:  1.27      Left ABI:  1.32  Right leg systolic / diastolic: 976/73 mmHg Left leg systolic / diastolic: 419/37 mmHg  Arm systolic / diastolic: 902/40 mmHG  Detailed report will be scanned into patient chart.   Results for orders placed or performed in visit on 01/10/21  HgB A1c  Result Value Ref Range   Hemoglobin A1C     HbA1c POC (<> result, manual entry)     HbA1c, POC (prediabetic range)     HbA1c, POC (controlled diabetic range) 6.0 0.0 - 7.0 %   Diabetic Labs (most recent): Lab Results  Component Value Date   HGBA1C 6.0 01/10/2021  HGBA1C 7.1 (A) 09/06/2020   HGBA1C 7.1 (A) 05/05/2020   Lipid Panel     Component Value Date/Time   CHOL 210 (H) 09/02/2020 0707   TRIG 265 (H) 09/02/2020 0707   HDL 48 (L) 09/02/2020 0707   CHOLHDL 4.4 09/02/2020 0707   VLDL 43 (H) 11/29/2016 0729   LDLCALC 121 (H) 09/02/2020 0707     Assessment & Plan:   1) Uncontrolled type 2 diabetes mellitus with complication.  She presents today with her logs, no meter, showing stable and at goal fasting glycemic profile.  Her POCT A1c today is 6%, improving since last visit of 7.1%.  She denies any hypoglycemia.  - She  remains at a high risk for more acute and chronic complications of diabetes which include CAD, CVA, CKD, retinopathy, and neuropathy. These are all discussed in detail with the patient.    Recent labs reviewed.  - Nutritional counseling repeated at each appointment due to patients tendency to fall back in to old habits.  - The patient admits there is a room for improvement in their diet and drink choices. -  Suggestion is made for the patient to avoid simple carbohydrates from their diet including Cakes, Sweet Desserts /  Pastries, Ice Cream, Soda (diet and regular), Sweet Tea, Candies, Chips, Cookies, Sweet Pastries,  Store Bought Juices, Alcohol in Excess of  1-2 drinks a day, Artificial Sweeteners, Coffee Creamer, and "Sugar-free" Products. This will help patient to have stable blood glucose profile and potentially avoid unintended weight gain.   - I encouraged the patient to switch to  unprocessed or minimally processed complex starch and increased protein intake (animal or plant source), fruits, and vegetables.   - Patient is advised to stick to a routine mealtimes to eat 3 meals  a day and avoid unnecessary snacks ( to snack only to correct hypoglycemia).  - I have approached patient with the following individualized plan to manage diabetes and patient agrees.  -Given her stable at goal glycemic profile, she can continue current medication regimen of Lantus 80 units SQ nightly, Trulicity 1.5 mg SQ weekly, and Glipizide 5mg  XL daily with breakfast.   -She is encouraged to continue monitoring blood glucose at least twice daily, before breakfast and before bed, and call the clinic if she has readings less than 70 or greater than 200 for 3 tests in a row.  - Patient specific target  for A1c; LDL, HDL, Triglycerides, and  Waist Circumference were discussed in detail.  2) BP/HTN: Her blood pressure is not controlled to target today.  She is advised to continue Enalapril 20 mg po daily and Lasix 40 mg po daily.  She may benefit from increase in dose if BP remains elevated at next visit.  3) Lipids/HPL:  Her recent lipid panel from 09/02/20 shows uncontrolled LDL of 121 and elevated triglycerides of 265.  She does not tolerate statins and does not take any medications for this.  She is advised to continue Repatha 140 mg SQ every 2 weeks.  4)  Weight/Diet:  Her Body mass index is 42.42 kg/m.--  She is a candidate for modest weight loss.  Exercise, and carbohydrates information provided.  5) Chronic Care/Health  Maintenance: -Patient is on ACEI/ARB medications and encouraged to continue to follow up with Ophthalmology, Podiatrist at least yearly or according to recommendations, and advised to  stay away from smoking. I have recommended yearly flu vaccine and pneumonia vaccination at least every 5 years; moderate intensity exercise for up  to 150 minutes weekly; and  sleep for at least 7 hours a day.  - I advised patient to maintain close follow up with Erven Colla, DO for primary care needs.  - Time spent on this patient care encounter:  40 min, of which > 50% was spent in  counseling and the rest reviewing her blood glucose logs , discussing her hypoglycemia and hyperglycemia episodes, reviewing her current and  previous labs / studies  ( including abstraction from other facilities) and medications  doses and developing a  long term treatment plan and documenting her care.   Please refer to Patient Instructions for Blood Glucose Monitoring and Insulin/Medications Dosing Guide"  in media tab for additional information. Please  also refer to " Patient Self Inventory" in the Media  tab for reviewed elements of pertinent patient history.  Lisa Burns participated in the discussions, expressed understanding, and voiced agreement with the above plans.  All questions were answered to her satisfaction. she is encouraged to contact clinic should she have any questions or concerns prior to her return visit.    Follow up plan: -Return in about 6 months (around 07/10/2021) for Diabetes follow up with A1c in office, Previsit labs, Bring glucometer and logs.  Rayetta Pigg, [facility] [address] [place], [place] [address] Phone: [phone] Fax: [phone]  01/10/2021, 4:22 PM

## 2021-01-10 NOTE — Patient Instructions (Signed)

## 2021-04-15 DIAGNOSIS — Z6841 Body Mass Index (BMI) 40.0 and over, adult: Secondary | ICD-10-CM | POA: Diagnosis not present

## 2021-04-15 DIAGNOSIS — Z1231 Encounter for screening mammogram for malignant neoplasm of breast: Secondary | ICD-10-CM | POA: Diagnosis not present

## 2021-04-15 DIAGNOSIS — Z01419 Encounter for gynecological examination (general) (routine) without abnormal findings: Secondary | ICD-10-CM | POA: Diagnosis not present

## 2021-04-28 DIAGNOSIS — H524 Presbyopia: Secondary | ICD-10-CM | POA: Diagnosis not present

## 2021-04-28 DIAGNOSIS — Z794 Long term (current) use of insulin: Secondary | ICD-10-CM | POA: Diagnosis not present

## 2021-04-28 DIAGNOSIS — H5213 Myopia, bilateral: Secondary | ICD-10-CM | POA: Diagnosis not present

## 2021-04-28 DIAGNOSIS — E119 Type 2 diabetes mellitus without complications: Secondary | ICD-10-CM | POA: Diagnosis not present

## 2021-04-28 DIAGNOSIS — H2513 Age-related nuclear cataract, bilateral: Secondary | ICD-10-CM | POA: Diagnosis not present

## 2021-04-28 DIAGNOSIS — H52203 Unspecified astigmatism, bilateral: Secondary | ICD-10-CM | POA: Diagnosis not present

## 2021-04-28 DIAGNOSIS — H04123 Dry eye syndrome of bilateral lacrimal glands: Secondary | ICD-10-CM | POA: Diagnosis not present

## 2021-04-28 LAB — HM DIABETES EYE EXAM

## 2021-05-04 ENCOUNTER — Other Ambulatory Visit: Payer: Self-pay | Admitting: Family Medicine

## 2021-05-04 DIAGNOSIS — I10 Essential (primary) hypertension: Secondary | ICD-10-CM

## 2021-05-05 NOTE — Telephone Encounter (Signed)
Needs f/u in July for refills and f/u htn. Thx. Dr. Lovena Le

## 2021-05-06 DIAGNOSIS — L851 Acquired keratosis [keratoderma] palmaris et plantaris: Secondary | ICD-10-CM | POA: Diagnosis not present

## 2021-05-06 DIAGNOSIS — M216X2 Other acquired deformities of left foot: Secondary | ICD-10-CM | POA: Diagnosis not present

## 2021-05-06 DIAGNOSIS — E1140 Type 2 diabetes mellitus with diabetic neuropathy, unspecified: Secondary | ICD-10-CM | POA: Diagnosis not present

## 2021-05-06 DIAGNOSIS — M216X1 Other acquired deformities of right foot: Secondary | ICD-10-CM | POA: Diagnosis not present

## 2021-05-10 NOTE — Telephone Encounter (Signed)
Pt scheduled for 05/24/21

## 2021-05-10 NOTE — Telephone Encounter (Signed)
Left message to return call 

## 2021-05-12 ENCOUNTER — Other Ambulatory Visit: Payer: Self-pay | Admitting: Family Medicine

## 2021-05-12 ENCOUNTER — Encounter: Payer: Self-pay | Admitting: Family Medicine

## 2021-05-12 DIAGNOSIS — E1142 Type 2 diabetes mellitus with diabetic polyneuropathy: Secondary | ICD-10-CM

## 2021-05-12 DIAGNOSIS — I10 Essential (primary) hypertension: Secondary | ICD-10-CM

## 2021-05-13 ENCOUNTER — Other Ambulatory Visit: Payer: Self-pay | Admitting: *Deleted

## 2021-05-13 DIAGNOSIS — E782 Mixed hyperlipidemia: Secondary | ICD-10-CM

## 2021-05-13 DIAGNOSIS — Z79899 Other long term (current) drug therapy: Secondary | ICD-10-CM

## 2021-05-13 NOTE — Telephone Encounter (Signed)
Lab Results  Component Value Date   HGBA1C 6.0 01/10/2021    Lab Results  Component Value Date   CREATININE 0.74 09/02/2020     Lab Results  Component Value Date   CHOL 210 (H) 09/02/2020   HDL 48 (L) 09/02/2020   LDLCALC 121 (H) 09/02/2020   TRIG 265 (H) 09/02/2020   CHOLHDL 4.4 09/02/2020     BP Readings from Last 3 Encounters:  01/10/21 (!) 150/76  10/29/20 118/74  09/06/20 (!) 144/82

## 2021-05-13 NOTE — Telephone Encounter (Signed)
Med check up 10/29/20 and has upcoming appt in july

## 2021-05-13 NOTE — Telephone Encounter (Signed)
Bw orders put in and pt was notified. [facility]

## 2021-05-24 ENCOUNTER — Ambulatory Visit: Payer: Self-pay | Admitting: Family Medicine

## 2021-06-03 ENCOUNTER — Other Ambulatory Visit: Payer: Self-pay | Admitting: Family Medicine

## 2021-06-03 DIAGNOSIS — I10 Essential (primary) hypertension: Secondary | ICD-10-CM

## 2021-06-07 ENCOUNTER — Ambulatory Visit: Payer: Self-pay | Admitting: Family Medicine

## 2021-06-23 ENCOUNTER — Telehealth: Payer: Self-pay | Admitting: Orthopedic Surgery

## 2021-06-23 NOTE — Telephone Encounter (Signed)
Patient called via voice message / call returned within the hour; relays she fell on 06/20/21, has had no treatment yet, and said she has a sore and bruised arm. Due to no availability today or tomorrow, patient will either go to Urgent care or try [place] office, as said she works in [place].

## 2021-06-24 ENCOUNTER — Ambulatory Visit (INDEPENDENT_AMBULATORY_CARE_PROVIDER_SITE_OTHER): Payer: BC Managed Care – PPO

## 2021-06-24 ENCOUNTER — Encounter: Payer: Self-pay | Admitting: Emergency Medicine

## 2021-06-24 ENCOUNTER — Ambulatory Visit
Admission: EM | Admit: 2021-06-24 | Discharge: 2021-06-24 | Disposition: A | Discharge status: Home / Self Care | Payer: BC Managed Care – PPO | Attending: Emergency Medicine | Admitting: Emergency Medicine

## 2021-06-24 DIAGNOSIS — M25532 Pain in left wrist: Secondary | ICD-10-CM

## 2021-06-24 DIAGNOSIS — M79642 Pain in left hand: Secondary | ICD-10-CM

## 2021-06-24 DIAGNOSIS — W19XXXA Unspecified fall, initial encounter: Secondary | ICD-10-CM

## 2021-06-24 MED ORDER — MELOXICAM 15 MG PO TABS: 15.0000 mg | ORAL_TABLET | Freq: Every day | ORAL | 0 refills | Status: DC

## 2021-06-24 NOTE — Discharge Instructions (Addendum)
X-rays negative for fracture or dislocation Continue conservative management of rest, ice, and elevation Ace applied Take mobic as needed for pain relief (may cause abdominal discomfort, ulcers, and GI bleeds avoid taking with other NSAIDs) Follow up with orthopedist for further evaluation and management Return or go to the ER if you have any new or worsening symptoms (fever, chills, chest pain, redness, swelling, deformity, etc...)

## 2021-06-24 NOTE — ED Provider Notes (Signed)
[facility]   [id number] 06/24/21 Arrival Time: 1910  CC: LT Hand pain and injury  SUBJECTIVE: History from: patient. Lisa Burns is a 60 y.o. female complains of LT thumb pain and injury that began x 4 days ago.  Denies a precipitating event or specific injury.  Localizes the pain to the base of thumb.  Describes the pain as intermittent.  Has tried OTC medications without relief.  Symptoms are made worse with ROM.  Denies similar symptoms in the past.  Denies fever, chills, erythema, effusion, weakness, numbness and tingling.   ROS: As per HPI.  All other pertinent ROS negative.     Past Medical History:  Diagnosis Date   Allergy    Asthma    Diabetes mellitus with neuropathy ([place])    Diabetes mellitus without complication ([place])    GERD (gastroesophageal reflux disease)    Hypertension    Microproteinuria    Reflux    Venous stasis    Past Surgical History:  Procedure Laterality Date   CESAREAN SECTION     COLONOSCOPY N/A 04/24/2016   Procedure: COLONOSCOPY;  Surgeon: Danie Binder, MD;  [facility];  Service: Endoscopy;  Laterality: N/A;  8:30 Am   COLONOSCOPY N/A 07/18/2019   Dr. Oneida Alar: 6 simple adenomas removed.  External/internal hemorrhoids.  Next colonoscopy in 3 years.   LAPAROSCOPIC NISSEN FUNDOPLICATION  [id number]   OTHER SURGICAL HISTORY     gerd surgery   POLYPECTOMY  07/18/2019   Procedure: POLYPECTOMY;  Surgeon: Danie Binder, MD;  [facility]: [facility];  Service: Endoscopy;;   THROAT SURGERY  2000   Allergies  Allergen Reactions   Augmentin [Amoxicillin-Pot Clavulanate]     GI upset Did it involve swelling of the face/tongue/throat, SOB, or low BP? No Did it involve sudden or severe rash/hives, skin peeling, or any reaction on the inside of your mouth or nose? No Did you need to seek medical attention at a hospital or doctor's office? No When did it last happen?      15 years If all above answers are "NO", may proceed with  cephalosporin use.    Cinnamon Hives and Swelling   Coconut Flavor Hives and Swelling   Januvia [Sitagliptin]     GI upset   Metformin And Related     GI upset   Pravastatin     Pain in joints   Strawberry (Diagnostic) Hives and Swelling   No current facility-administered medications on file prior to encounter.   Current Outpatient Medications on File Prior to Encounter  Medication Sig Dispense Refill   albuterol (PROAIR HFA) 108 (90 Base) MCG/ACT inhaler INHALE TWO PUFFS INTO THE LUNGS EVERY SIX HOURS AS NEEDED 8.5 g 5   Azelaic Acid 15 % cream Apply 1 application topically daily. Apply to face     clobetasol (TEMOVATE) 0.05 % external solution Apply 1 application topically 2 (two) times daily.     Dulaglutide (TRULICITY) 1.5 0000000 SOPN Inject 1.5 mg into the skin once a week. 6 mL 3   enalapril (VASOTEC) 20 MG tablet TAKE ONE TABLET ('20MG'$  TOTAL) BY MOUTH ATBEDTIME 90 tablet 0   Evolocumab (REPATHA) 140 MG/ML SOSY Inject 140 mg into the skin every 14 (fourteen) days. 6 mL 3   fluticasone (FLONASE) 50 MCG/ACT nasal spray Place 1 spray into both nostrils daily. (Patient taking differently: Place 1 spray into both nostrils daily as needed for allergies.) 16 g 5   fluticasone (FLOVENT HFA) 110  MCG/ACT inhaler INHALE TWO PUFFS INTO THE LUNGS TWICE DAILY 12 g 5   furosemide (LASIX) 40 MG tablet TAKE ONE TABLET ('40MG'$  TOTAL) BY MOUTH EVERY MORNING 30 tablet 0   gabapentin (NEURONTIN) 400 MG capsule TAKE ONE (1) CAPSULE THREE (3) TIMES EACH DAY 270 capsule 1   glipiZIDE (GLUCOTROL XL) 5 MG 24 hr tablet Take 1 tablet (5 mg total) by mouth daily with breakfast. 90 tablet 3   glucose blood test strip Use as instructed 100 each 12   insulin glargine (LANTUS SOLOSTAR) 100 UNIT/ML Solostar Pen Inject 80 Units into the skin at bedtime. INJECT 80 UNITS SUBCUTANEOUSLY AT BEDTIME. 75 mL 3   lansoprazole (PREVACID) 15 MG capsule Take 1 capsule (15 mg total) by mouth daily. 90 capsule 1   loratadine  (CLARITIN) 10 MG tablet Take 10 mg by mouth daily.      nortriptyline (PAMELOR) 50 MG capsule TAKE TWO CAPSULES BY MOUTH AT BEDTIME 180 capsule 0   valACYclovir (VALTREX) 500 MG tablet Take 500 mg by mouth 2 (two) times daily as needed (cold sore flare ups).      Social History   Socioeconomic History   Marital status: Married    Spouse name: Not on file   Number of children: 1   Years of education: HS   Highest education level: Not on file  Occupational History   Occupation: [employer]   Tobacco Use   Smoking status: Former    Packs/day: 1.00    Years: 5.00    Pack years: 5.00    Types: Cigarettes   Smokeless tobacco: Never  Vaping Use   Vaping Use: Never used  Substance and Sexual Activity   Alcohol use: Yes    Alcohol/week: 0.0 standard drinks    Comment: occasionally   Drug use: No   Sexual activity: Not on file  Other Topics Concern   Not on file  Social History Narrative   Drinks 2 cans of soda a day    Social Determinants of Health   Financial Resource Strain: Not on file  Food Insecurity: Not on file  Transportation Needs: Not on file  Physical Activity: Not on file  Stress: Not on file  Social Connections: Not on file  Intimate Partner Violence: Not on file   Family History  Problem Relation Age of Onset   Diabetes Mother    Heart disease Father    Cancer Father        lung   Colon cancer Father    Heart disease Sister    Diabetes Sister    Heart disease Brother    Diabetes Brother    Colon cancer Paternal Grandmother     OBJECTIVE:  Vitals:   06/24/21 1922  BP: (!) 149/82  Pulse: 78  Resp: 18  Temp: 98.5 F (36.9 C)  TempSrc: Oral  SpO2: 96%    General appearance: ALERT; in no acute distress.  Head: NCAT Lungs: Normal respiratory effort CV: Radial pulse 2+ Musculoskeletal: LT hand Inspection: Ecchymosis base of thumb Palpation: TTP over base of thumb, and lateral wrist ROM: FROM active and passive Strength: deferred Skin:  warm and dry Neurologic: Ambulates without difficulty; Sensation intact about the upper extremities Psychological: alert and cooperative; normal mood and affect  DIAGNOSTIC STUDIES:  DG Hand Complete Left  Result Date: 06/24/2021 CLINICAL DATA:  Fall with pain EXAM: LEFT HAND - COMPLETE 3+ VIEW COMPARISON:  None. FINDINGS: There is no evidence of fracture or dislocation. There is no  evidence of arthropathy or other focal bone abnormality. Soft tissues are unremarkable. IMPRESSION: Negative. Electronically Signed   By: Donavan Foil M.D.   On: 06/24/2021 19:36     ASSESSMENT & PLAN:  1. Hand pain, left   2. Pain of left hand   3. Left wrist pain      Meds ordered this encounter  Medications   meloxicam (MOBIC) 15 MG tablet    Sig: Take 1 tablet (15 mg total) by mouth daily.    Dispense:  20 tablet    Refill:  0    Order Specific Question:   Supervising Provider    Answer:   Raylene Everts [id number]   X-rays negative for fracture or dislocation Continue conservative management of rest, ice, and elevation Ace applied Take mobic as needed for pain relief (may cause abdominal discomfort, ulcers, and GI bleeds avoid taking with other NSAIDs) Follow up with orthopedist for further evaluation and management Return or go to the ER if you have any new or worsening symptoms (fever, chills, chest pain, redness, swelling, deformity, etc...)    Reviewed expectations re: course of current medical issues. Questions answered. Outlined signs and symptoms indicating need for more acute intervention. Patient verbalized understanding. After Visit Summary given.     Lestine Box, PA-C 06/24/21 1953

## 2021-06-24 NOTE — ED Triage Notes (Signed)
LT hand pain after fall on Monday

## 2021-07-04 ENCOUNTER — Other Ambulatory Visit: Payer: Self-pay | Admitting: Family Medicine

## 2021-07-04 DIAGNOSIS — I10 Essential (primary) hypertension: Secondary | ICD-10-CM

## 2021-07-04 NOTE — Telephone Encounter (Signed)
pls make sure pt gets labs before appt.  Will give small amt of lasix till see her at appt.  Dr. Lovena Le

## 2021-07-04 NOTE — Telephone Encounter (Signed)
Mychart message sent to patient.

## 2021-07-07 ENCOUNTER — Encounter: Payer: Self-pay | Admitting: Family Medicine

## 2021-07-07 ENCOUNTER — Ambulatory Visit: Payer: BC Managed Care – PPO | Admitting: Family Medicine

## 2021-07-07 LAB — CBC WITH DIFFERENTIAL/PLATELET
Absolute Monocytes: 432 cells/uL (ref 200–950)
Basophils Absolute: 69 cells/uL (ref 0–200)
Basophils Relative: 1.5 %
Eosinophils Absolute: 152 cells/uL (ref 15–500)
Eosinophils Relative: 3.3 %
HCT: 36.6 % (ref 35.0–45.0)
Hemoglobin: 12.2 g/dL (ref 11.7–15.5)
Lymphs Abs: 1260 cells/uL (ref 850–3900)
MCH: 29 pg (ref 27.0–33.0)
MCHC: 33.3 g/dL (ref 32.0–36.0)
MCV: 87.1 fL (ref 80.0–100.0)
MPV: 10 fL (ref 7.5–12.5)
Monocytes Relative: 9.4 %
Neutro Abs: 2686 cells/uL (ref 1500–7800)
Neutrophils Relative %: 58.4 %
Platelets: 238 10*3/uL (ref 140–400)
RBC: 4.2 10*6/uL (ref 3.80–5.10)
RDW: 13.6 % (ref 11.0–15.0)
Total Lymphocyte: 27.4 %
WBC: 4.6 10*3/uL (ref 3.8–10.8)

## 2021-07-07 LAB — LIPID PANEL
Cholesterol: 112 mg/dL (ref <200)
HDL: 60 mg/dL (ref >=50)
LDL Cholesterol (Calc): 34 mg/dL (calc)
Non-HDL Cholesterol (Calc): 52 mg/dL (calc) (ref <130)
Total CHOL/HDL Ratio: 1.9 (calc) (ref <5.0)
Triglycerides: 96 mg/dL (ref <150)

## 2021-07-07 LAB — COMPREHENSIVE METABOLIC PANEL
AG Ratio: 2.1 (calc) (ref 1.0–2.5)
ALT: 19 U/L (ref 6–29)
AST: 12 U/L (ref 10–35)
Albumin: 4.1 g/dL (ref 3.6–5.1)
Alkaline phosphatase (APISO): 96 U/L (ref 37–153)
BUN: 12 mg/dL (ref 7–25)
CO2: 27 mmol/L (ref 20–32)
Calcium: 9.4 mg/dL (ref 8.6–10.4)
Chloride: 109 mmol/L (ref 98–110)
Creat: 0.76 mg/dL (ref 0.50–1.05)
Globulin: 2 g/dL (calc) (ref 1.9–3.7)
Glucose, Bld: 46 mg/dL — ABNORMAL LOW (ref 65–99)
Potassium: 3.8 mmol/L (ref 3.5–5.3)
Sodium: 143 mmol/L (ref 135–146)
Total Bilirubin: 0.4 mg/dL (ref 0.2–1.2)
Total Protein: 6.1 g/dL (ref 6.1–8.1)

## 2021-07-11 ENCOUNTER — Other Ambulatory Visit: Payer: Self-pay

## 2021-07-11 ENCOUNTER — Ambulatory Visit: Payer: BC Managed Care – PPO | Admitting: Nurse Practitioner

## 2021-07-11 ENCOUNTER — Encounter: Payer: Self-pay | Admitting: Nurse Practitioner

## 2021-07-11 VITALS — BP 143/86 | HR 80 | Ht 60.0 in | Wt 219.0 lb

## 2021-07-11 DIAGNOSIS — I10 Essential (primary) hypertension: Secondary | ICD-10-CM

## 2021-07-11 DIAGNOSIS — Z794 Long term (current) use of insulin: Secondary | ICD-10-CM | POA: Diagnosis not present

## 2021-07-11 DIAGNOSIS — E118 Type 2 diabetes mellitus with unspecified complications: Secondary | ICD-10-CM | POA: Diagnosis not present

## 2021-07-11 DIAGNOSIS — E782 Mixed hyperlipidemia: Secondary | ICD-10-CM

## 2021-07-11 DIAGNOSIS — IMO0002 Reserved for concepts with insufficient information to code with codable children: Secondary | ICD-10-CM

## 2021-07-11 DIAGNOSIS — E1165 Type 2 diabetes mellitus with hyperglycemia: Secondary | ICD-10-CM | POA: Diagnosis not present

## 2021-07-11 LAB — POCT UA - MICROALBUMIN
Albumin/Creatinine Ratio, Urine, POC: <30
Creatinine, POC: 50 mg/dL
Microalbumin Ur, POC: 10 mg/L

## 2021-07-11 LAB — POCT GLYCOSYLATED HEMOGLOBIN (HGB A1C): Hemoglobin A1C: 5.6 % (ref 4.0–5.6)

## 2021-07-11 MED ORDER — LANTUS SOLOSTAR 100 UNIT/ML ~~LOC~~ SOPN: 70.0000 [IU] | PEN_INJECTOR | Freq: Every day | SUBCUTANEOUS | 3 refills | Status: DC

## 2021-07-11 NOTE — Patient Instructions (Signed)

## 2021-07-11 NOTE — Progress Notes (Signed)
07/11/2021                     Endocrinology follow-up note   Subjective:    Patient ID: Lisa Burns, female    DOB: 1960/12/09, PCP Lisa Colla, Lisa Burns   Past Medical History:  Diagnosis Date   Allergy    Asthma    Diabetes mellitus with neuropathy ([place])    Diabetes mellitus without complication ([place])    GERD (gastroesophageal reflux disease)    Hypertension    Microproteinuria    Reflux    Venous stasis    Past Surgical History:  Procedure Laterality Date   CESAREAN SECTION     COLONOSCOPY N/A 04/24/2016   Procedure: COLONOSCOPY;  Surgeon: Danie Binder, MD;  [facility];  Service: Endoscopy;  Laterality: N/A;  8:30 Am   COLONOSCOPY N/A 07/18/2019   Dr. Oneida Alar: 6 simple adenomas removed.  External/internal hemorrhoids.  Next colonoscopy in 3 years.   LAPAROSCOPIC NISSEN FUNDOPLICATION  [id number]   OTHER SURGICAL HISTORY     gerd surgery   POLYPECTOMY  07/18/2019   Procedure: POLYPECTOMY;  Surgeon: Danie Binder, MD;  [facility];  Service: Endoscopy;;   THROAT SURGERY  2000   Social History   Socioeconomic History   Marital status: Married    Spouse name: Not on file   Number of children: 1   Years of education: HS   Highest education level: Not on file  Occupational History   Occupation: [employer]   Tobacco Use   Smoking status: Former    Packs/day: 1.00    Years: 5.00    Pack years: 5.00    Types: Cigarettes   Smokeless tobacco: Never  Vaping Use   Vaping Use: Never used  Substance and Sexual Activity   Alcohol use: Yes    Alcohol/week: 0.0 standard drinks    Comment: occasionally   Drug use: No   Sexual activity: Not on file  Other Topics Concern   Not on file  Social History Narrative   Drinks 2 cans of soda a day    Social Determinants of Health   Financial Resource Strain: Not on file  Food Insecurity: Not on file  Transportation Needs: Not on file  Physical Activity: Not on file  Stress: Not on file   Social Connections: Not on file   Outpatient Encounter Medications as of 07/11/2021  Medication Sig   albuterol (PROAIR HFA) 108 (90 Base) MCG/ACT inhaler INHALE TWO PUFFS INTO THE LUNGS EVERY SIX HOURS AS NEEDED   Azelaic Acid 15 % cream Apply 1 application topically daily. Apply to face   clobetasol (TEMOVATE) 0.05 % external solution Apply 1 application topically 2 (two) times daily.   Dulaglutide (TRULICITY) 1.5 0000000 SOPN Inject 1.5 mg into the skin once a week.   enalapril (VASOTEC) 20 MG tablet TAKE ONE TABLET ('20MG'$  TOTAL) BY MOUTH ATBEDTIME   Evolocumab (REPATHA) 140 MG/ML SOSY Inject 140 mg into the skin every 14 (fourteen) days.   fluticasone (FLONASE) 50 MCG/ACT nasal spray Place 1 spray into both nostrils daily. (Patient taking differently: Place 1 spray into both nostrils daily as needed for allergies.)   fluticasone (FLOVENT HFA) 110 MCG/ACT inhaler INHALE TWO PUFFS INTO THE LUNGS TWICE DAILY   furosemide (LASIX) 40 MG tablet TAKE ONE TABLET ('40MG'$  TOTAL) BY MOUTH EVERY MORNING   gabapentin (NEURONTIN) 400 MG capsule TAKE ONE (1) CAPSULE THREE (3) TIMES EACH DAY  glipiZIDE (GLUCOTROL XL) 5 MG 24 hr tablet Take 1 tablet (5 mg total) by mouth daily with breakfast.   glucose blood test strip Use as instructed   insulin glargine (LANTUS SOLOSTAR) 100 UNIT/ML Solostar Pen Inject 70 Units into the skin at bedtime. INJECT 80 UNITS SUBCUTANEOUSLY AT BEDTIME.   lansoprazole (PREVACID) 15 MG capsule Take 1 capsule (15 mg total) by mouth daily.   loratadine (CLARITIN) 10 MG tablet Take 10 mg by mouth daily.    meloxicam (MOBIC) 15 MG tablet Take 1 tablet (15 mg total) by mouth daily.   nortriptyline (PAMELOR) 50 MG capsule TAKE TWO CAPSULES BY MOUTH AT BEDTIME   valACYclovir (VALTREX) 500 MG tablet Take 500 mg by mouth 2 (two) times daily as needed (cold sore flare ups).    [DISCONTINUED] insulin glargine (LANTUS SOLOSTAR) 100 UNIT/ML Solostar Pen Inject 80 Units into the skin at  bedtime. INJECT 80 UNITS SUBCUTANEOUSLY AT BEDTIME.   No facility-administered encounter medications on file as of 07/11/2021.   ALLERGIES: Allergies  Allergen Reactions   Augmentin [Amoxicillin-Pot Clavulanate]     GI upset Did it involve swelling of the face/tongue/throat, SOB, or low BP? No Did it involve sudden or severe rash/hives, skin peeling, or any reaction on the inside of your mouth or nose? No Did you need to seek medical attention at a hospital or doctor's office? No When did it last happen?      15 years If all above answers are "NO", may proceed with cephalosporin use.    Cinnamon Hives and Swelling   Coconut Flavor Hives and Swelling   Januvia [Sitagliptin]     GI upset   Metformin And Related     GI upset   Pravastatin     Pain in joints   Strawberry (Diagnostic) Hives and Swelling   VACCINATION STATUS: Immunization History  Administered Date(s) Administered   Influenza, Seasonal, Injecte, Preservative Fre 08/09/2016   Influenza,inj,Quad PF,6+ Mos 12/27/2017   PFIZER(Purple Top)SARS-COV-2 Vaccination 03/11/2020, 04/11/2020   Pneumococcal Polysaccharide-23 12/30/2013    Diabetes She presents for her follow-up diabetic visit. She has type 2 diabetes mellitus. Onset time: She was diagnosed at approximate age of 58 years. Her disease course has been improving. Hypoglycemia symptoms include nervousness/anxiousness, sweats and tremors. Pertinent negatives for hypoglycemia include no confusion, headaches, pallor or seizures. There are no diabetic associated symptoms. Pertinent negatives for diabetes include no chest pain, no polydipsia, no polyphagia and no polyuria. There are no hypoglycemic complications. Symptoms are stable. There are no diabetic complications. Risk factors for coronary artery disease include diabetes mellitus, dyslipidemia, family history, hypertension, obesity, sedentary lifestyle and tobacco exposure. Current diabetic treatment includes insulin  injections and oral agent (monotherapy). She is compliant with treatment most of the time. Her weight is stable. She is following a generally healthy diet. When asked about meal planning, she reported none. She has not had a previous visit with a dietitian. She never participates in exercise. Her home blood glucose trend is decreasing steadily. Her breakfast blood glucose range is generally 70-90 mg/dl. (She presents today with her logs, no meter, showing tight fasting glycemic profile.  Her POCT A1c today is 5.6%, improving from last visit of 6%.  She does report occasional symptoms of hypoglycemia, usually before meals, likely due to timing. ) An ACE inhibitor/angiotensin II receptor blocker is being taken. She sees a podiatrist.Eye exam is current.  Hyperlipidemia This is a chronic problem. The current episode started more than 1 year ago. The problem  is controlled. Recent lipid tests were reviewed and are normal. Exacerbating diseases include chronic renal disease, diabetes and obesity. Factors aggravating her hyperlipidemia include fatty foods. Pertinent negatives include no chest pain, myalgias or shortness of breath. Treatments tried: she does not tolerate statins; is on Repatha. The current treatment provides no improvement of lipids. Compliance problems include medication side effects, adherence to diet and adherence to exercise.  Risk factors for coronary artery disease include diabetes mellitus, dyslipidemia, hypertension, obesity and a sedentary lifestyle.  Hypertension This is a chronic problem. The current episode started more than 1 year ago. The problem has been gradually improving since onset. The problem is controlled. Associated symptoms include peripheral edema and sweats. Pertinent negatives include no chest pain, headaches, palpitations or shortness of breath. There are no associated agents to hypertension. Risk factors for coronary artery disease include dyslipidemia, diabetes mellitus,  obesity, sedentary lifestyle and post-menopausal state. Past treatments include ACE inhibitors and diuretics. The current treatment provides mild improvement. Compliance problems include diet and exercise.  Hypertensive end-organ damage includes kidney disease. Identifiable causes of hypertension include chronic renal disease.    Review of systems  Constitutional: + Minimally fluctuating body weight,  current Body mass index is 42.77 kg/m. , no fatigue, no subjective hyperthermia, no subjective hypothermia Eyes: no blurry vision, no xerophthalmia ENT: no sore throat, no nodules palpated in throat, no dysphagia/odynophagia, no hoarseness Cardiovascular: no chest pain, no shortness of breath, no palpitations, no leg swelling Respiratory: no cough, no shortness of breath Gastrointestinal: no nausea/vomiting/diarrhea Musculoskeletal: no muscle/joint aches Skin: no rashes, no hyperemia Neurological: no tremors, no numbness, no tingling, no dizziness Psychiatric: no depression, no anxiety    Objective:    BP (!) 143/86   Pulse 80   Ht 5' (1.524 m)   Wt 219 lb (99.3 kg)   BMI 42.77 kg/m   Wt Readings from Last 3 Encounters:  07/11/21 219 lb (99.3 kg)  01/10/21 217 lb 3.2 oz (98.5 kg)  10/29/20 220 lb (99.8 kg)    BP Readings from Last 3 Encounters:  07/11/21 (!) 143/86  06/24/21 (!) 149/82  01/10/21 (!) 150/76     Physical Exam- Limited  Constitutional:  Body mass index is 42.77 kg/m. , not in acute distress, normal state of mind Eyes:  EOMI, no exophthalmos Neck: Supple Cardiovascular: RRR, no murmurs, rubs, or gallops, no edema Respiratory: Adequate breathing efforts, no crackles, rales, rhonchi, or wheezing Musculoskeletal: no gross deformities, strength intact in all four extremities, no gross restriction of joint movements Skin:  no rashes, no hyperemia Neurological: no tremor with outstretched hands    Results for orders placed or performed in visit on 07/11/21   HgB A1c  Result Value Ref Range   Hemoglobin A1C 5.6 4.0 - 5.6 %   HbA1c POC (<> result, manual entry)     HbA1c, POC (prediabetic range)     HbA1c, POC (controlled diabetic range)    POCT UA - Microalbumin  Result Value Ref Range   Microalbumin Ur, POC 10 mg/L   Creatinine, POC 50 mg/dL   Albumin/Creatinine Ratio, Urine, POC <30    Diabetic Labs (most recent): Lab Results  Component Value Date   HGBA1C 5.6 07/11/2021   HGBA1C 6.0 01/10/2021   HGBA1C 7.1 (A) 09/06/2020   Lipid Panel     Component Value Date/Time   CHOL 112 07/07/2021 0720   TRIG 96 07/07/2021 0720   HDL 60 07/07/2021 0720   CHOLHDL 1.9 07/07/2021 0720   VLDL  43 (H) 11/29/2016 0729   LDLCALC 34 07/07/2021 0720     Assessment & Plan:   1) Uncontrolled type 2 diabetes mellitus with complication.  She presents today with her logs, no meter, showing tight fasting glycemic profile.  Her POCT A1c today is 5.6%, improving from last visit of 6%.  She does report occasional symptoms of hypoglycemia, usually before meals, likely due to timing.   - She  remains at a high risk for more acute and chronic complications of diabetes which include CAD, CVA, CKD, retinopathy, and neuropathy. These are all discussed in detail with the patient.    Recent labs reviewed.  - Nutritional counseling repeated at each appointment due to patients tendency to fall back in to old habits.  - The patient admits there is a room for improvement in their diet and drink choices. -  Suggestion is made for the patient to avoid simple carbohydrates from their diet including Cakes, Sweet Desserts / Pastries, Ice Cream, Soda (diet and regular), Sweet Tea, Candies, Chips, Cookies, Sweet Pastries, Store Bought Juices, Alcohol in Excess of 1-2 drinks a day, Artificial Sweeteners, Coffee Creamer, and "Sugar-free" Products. This will help patient to have stable blood glucose profile and potentially avoid unintended weight gain.   - I encouraged the  patient to switch to unprocessed or minimally processed complex starch and increased protein intake (animal or plant source), fruits, and vegetables.   - Patient is advised to stick to a routine mealtimes to eat 3 meals a day and avoid unnecessary snacks (to snack only to correct hypoglycemia).  - I have approached patient with the following individualized plan to manage diabetes and patient agrees.  -Based on her tight fasting glycemic profile, she is advised to lower her dose of Lantus to 70 units SQ nightly and continue Trulicity 1.5 mg SQ weekly and Glipizide 5 mg XL daily with breakfast.   I anticipate the potential need to reduce insulin dose further if she continues to maintain such good control of her diabetes.     -She is encouraged to continue monitoring blood glucose at least twice daily, before breakfast and before bed, and call the clinic if she has readings less than 70 or greater than 200 for 3 tests in a row.  - Patient specific target  for A1c; LDL, HDL, Triglycerides, and  Waist Circumference were discussed in detail.  2) BP/HTN: Her blood pressure is not controlled to target, just slightly elevated today.  She is advised to continue Enalapril 20 mg po daily and Lasix 40 mg po daily.    3) Lipids/HPL:  Her recent lipid panel from 07/07/21 shows controlled LDL of 34.  She does not tolerate statins.  She is advised to continue Repatha 140 mg SQ every 2 weeks.  4)  Weight/Diet:  Her Body mass index is 42.77 kg/m.--  She is a candidate for modest weight loss.  Exercise, and carbohydrates information provided.  5) Chronic Care/Health Maintenance: -Patient is on ACEI/ARB medications and encouraged to continue to follow up with Ophthalmology, Podiatrist at least yearly or according to recommendations, and advised to  stay away from smoking. I have recommended yearly flu vaccine and pneumonia vaccination at least every 5 years; moderate intensity exercise for up to 150 minutes weekly;  and  sleep for at least 7 hours a day.  - I advised patient to maintain close follow up with Lisa Colla, Lisa Burns for primary care needs.    I spent 30 minutes in the  care of the patient today including review of labs from [facility], Lipids, Thyroid Function, Hematology (current and previous including abstractions from other facilities); face-to-face time discussing  her blood glucose readings/logs, discussing hypoglycemia and hyperglycemia episodes and symptoms, medications doses, her options of short and long term treatment based on the latest standards of care / guidelines;  discussion about incorporating lifestyle medicine;  and documenting the encounter.    Please refer to Patient Instructions for Blood Glucose Monitoring and Insulin/Medications Dosing Guide"  in media tab for additional information. Please  also refer to " Patient Self Inventory" in the Media  tab for reviewed elements of pertinent patient history.  Lisa Burns participated in the discussions, expressed understanding, and voiced agreement with the above plans.  All questions were answered to her satisfaction. she is encouraged to contact clinic should she have any questions or concerns prior to her return visit.    Follow up plan: -Return in about 6 months (around 01/11/2022) for Diabetes F/U with A1c in office, No previsit labs, Bring meter and logs.    Rayetta Pigg, [facility] [address] [place], [place] [address] Phone: [phone] Fax: [phone]  07/11/2021, 3:01 PM

## 2021-09-07 DIAGNOSIS — E119 Type 2 diabetes mellitus without complications: Secondary | ICD-10-CM | POA: Diagnosis not present

## 2021-09-07 DIAGNOSIS — E785 Hyperlipidemia, unspecified: Secondary | ICD-10-CM | POA: Diagnosis not present

## 2021-09-07 DIAGNOSIS — R809 Proteinuria, unspecified: Secondary | ICD-10-CM | POA: Diagnosis not present

## 2021-09-07 DIAGNOSIS — Z23 Encounter for immunization: Secondary | ICD-10-CM | POA: Diagnosis not present

## 2021-09-07 DIAGNOSIS — R059 Cough, unspecified: Secondary | ICD-10-CM | POA: Diagnosis not present

## 2021-09-07 DIAGNOSIS — I10 Essential (primary) hypertension: Secondary | ICD-10-CM | POA: Diagnosis not present

## 2021-09-14 ENCOUNTER — Other Ambulatory Visit: Payer: Self-pay | Admitting: Nurse Practitioner

## 2021-09-16 DIAGNOSIS — E119 Type 2 diabetes mellitus without complications: Secondary | ICD-10-CM | POA: Diagnosis not present

## 2021-09-16 DIAGNOSIS — I10 Essential (primary) hypertension: Secondary | ICD-10-CM | POA: Diagnosis not present

## 2021-10-05 DIAGNOSIS — Z0001 Encounter for general adult medical examination with abnormal findings: Secondary | ICD-10-CM | POA: Diagnosis not present

## 2021-10-17 IMAGING — MR MR HEAD W/O CM
9 of 11 series · 37 of 48 positions shown · non-contrast
Comparison: Previous MRI from 01/09/2016.

CLINICAL DATA: Initial evaluation for dizziness for 1 week, ataxia.

EXAM:
MRI HEAD WITHOUT CONTRAST
TECHNIQUE: Multiplanar, multiecho pulse sequences of the brain and surrounding
structures were obtained without intravenous contrast.

[Series 2: T1 · sagittal · 5.0mm · 0.42mm/px · 1 of 20 slices shown (1 of 2)]
[im 1/20]
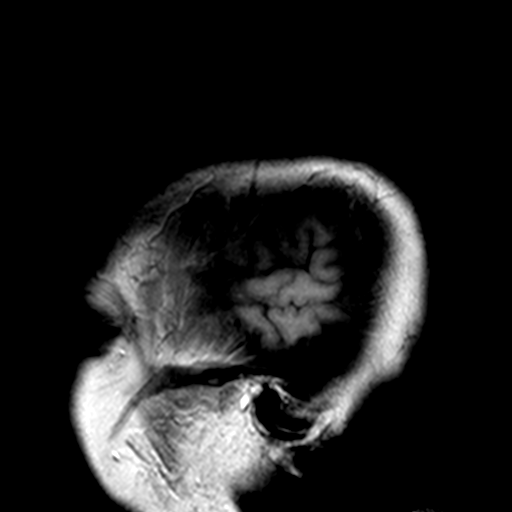

[Series 3: DWI · axial · 3.0mm · 0.82mm/px · z∈[-70,+76]mm · 6 of 50 slices shown (1 of 2)]
[im 1/50]
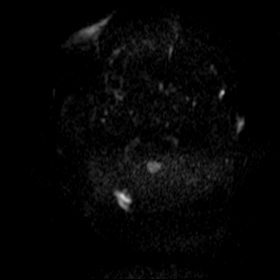
[im 10/50]
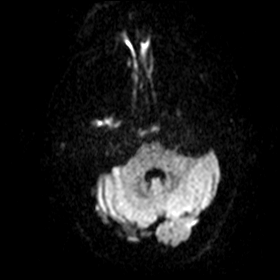
[im 20/50]
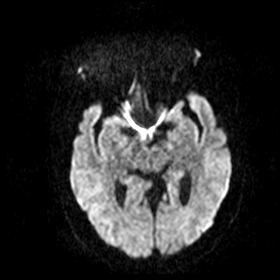
[im 30/50]
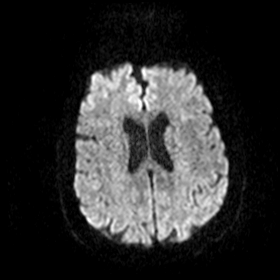
[im 40/50]
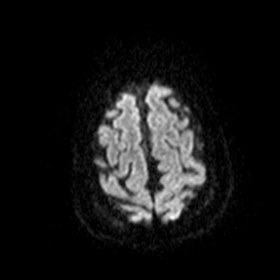
[im 50/50]
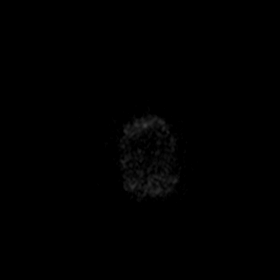

[Series 4: ax dwi_adc · axial · 3.0mm · 0.82mm/px · z∈[-70,+46]mm · 5 of 50 slices shown]
[im 1/50]
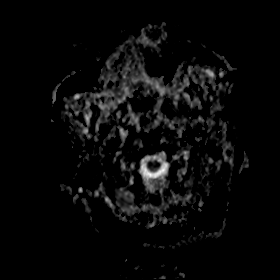
[im 10/50]
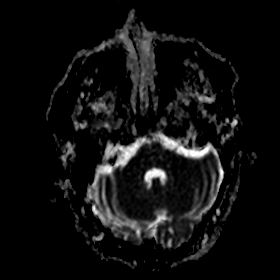
[im 20/50]
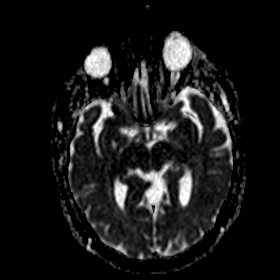
[im 30/50]
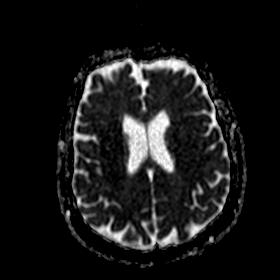
[im 40/50]
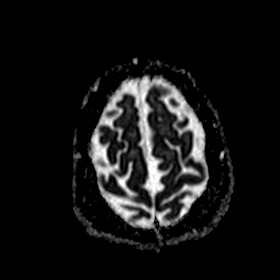

[Series 5: DWI · coronal · 5.0mm · 0.47mm/px · 4 of 34 slices shown (2 of 2)]
[im 1/34]
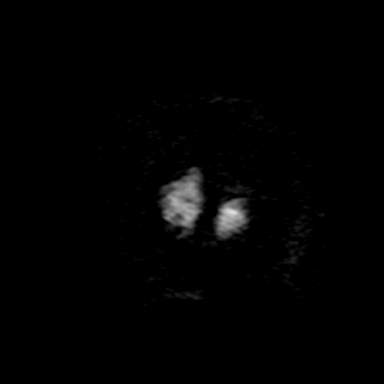
[im 12/34]
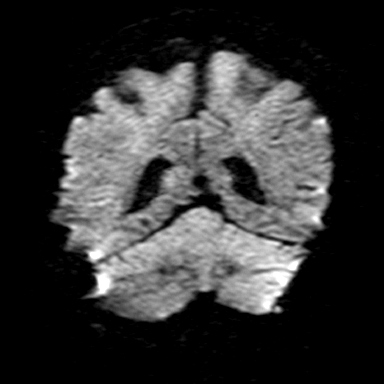
[im 23/34]
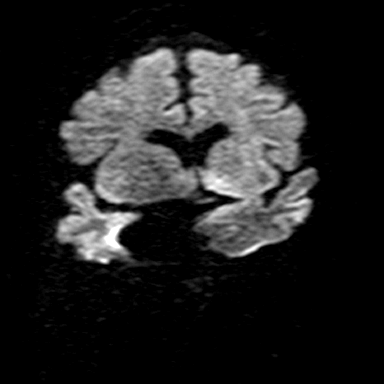
[im 34/34]
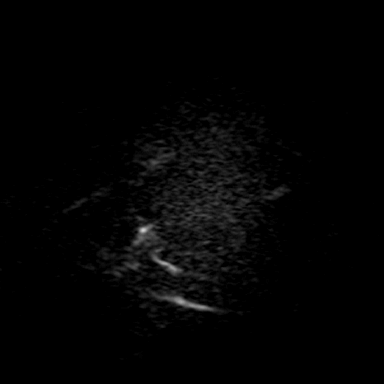

[Series 7: T2 · axial · 5.0mm · 0.75mm/px · z∈[-69,+74]mm · 3 of 23 slices shown (1 of 3)]
[im 1/23]
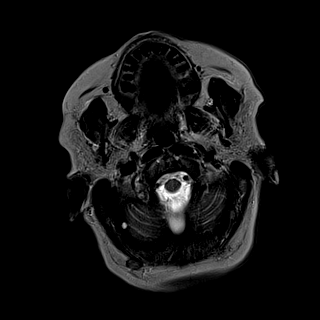
[im 12/23]
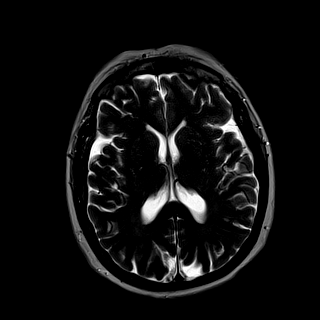
[im 23/23]
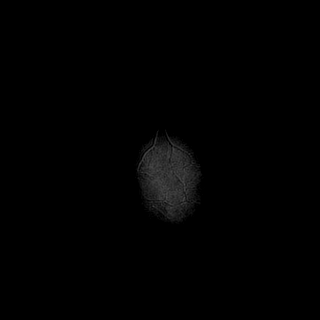

[Series 8: T2 · axial · 5.0mm · 0.45mm/px · z∈[-62,+68]mm · 2 of 21 slices shown (2 of 3)]
[im 1/21]
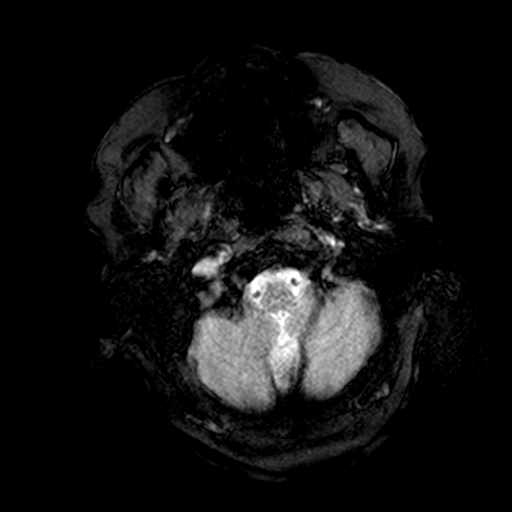
[im 21/21]
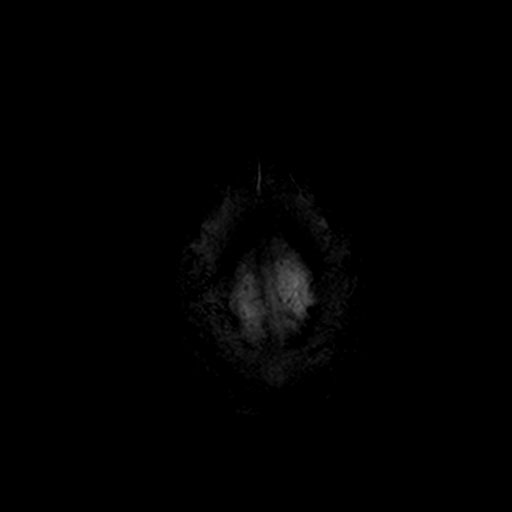

[Series 9: FLAIR · axial · 3.0mm · 0.94mm/px · z∈[-66,+71]mm · 5 of 47 slices shown]
[im 1/47]
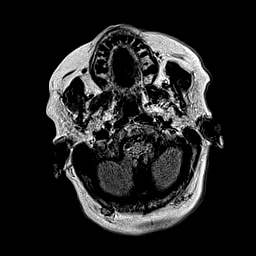
[im 12/47]
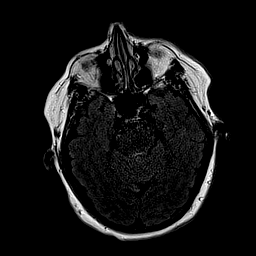
[im 24/47]
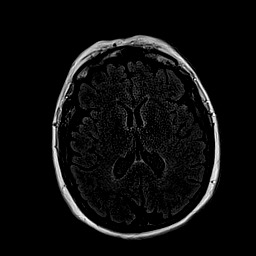
[im 35/47]
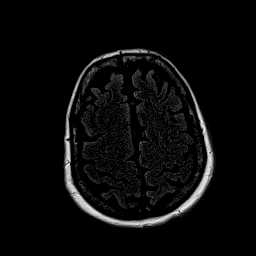
[im 47/47]
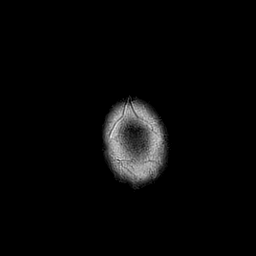

[Series 10: T1 · axial · 2.0mm · 0.47mm/px · z∈[-81,+107]mm · 8 of 95 slices shown (2 of 2)]
[im 1/95]
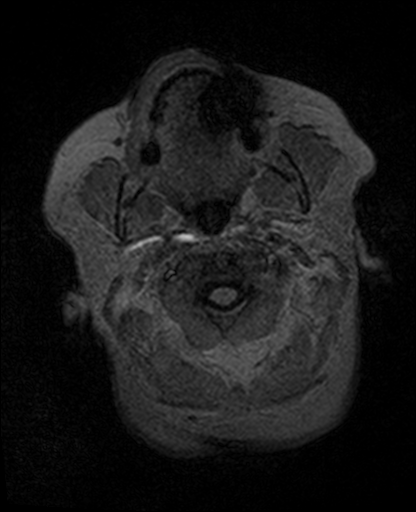
[im 19/95]
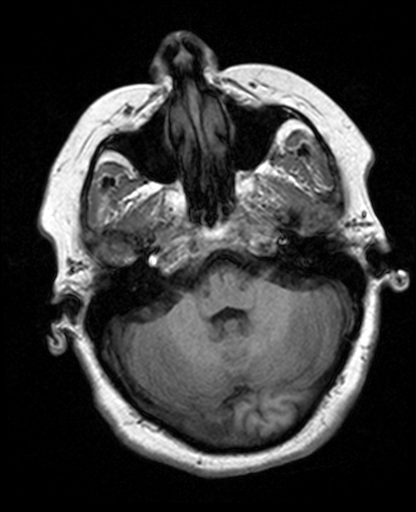
[im 29/95]
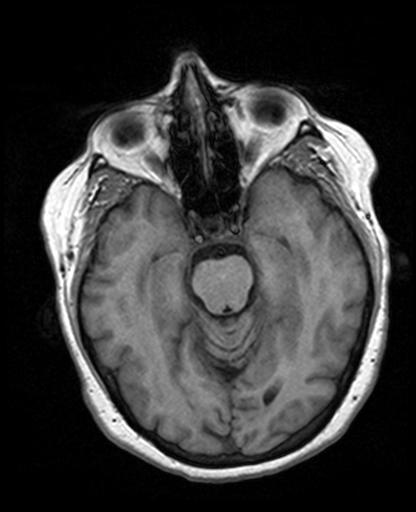
[im 38/95]
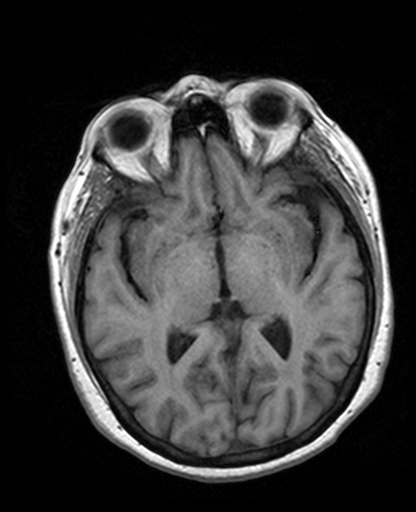
[im 57/95]
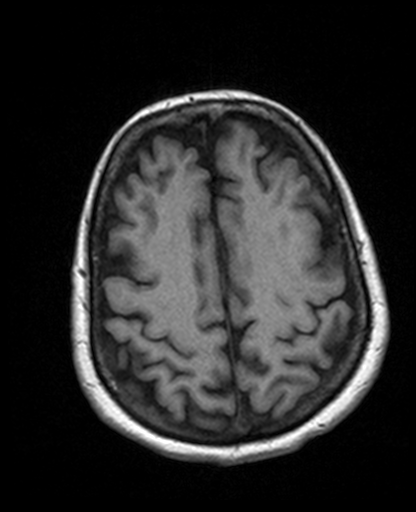
[im 66/95]
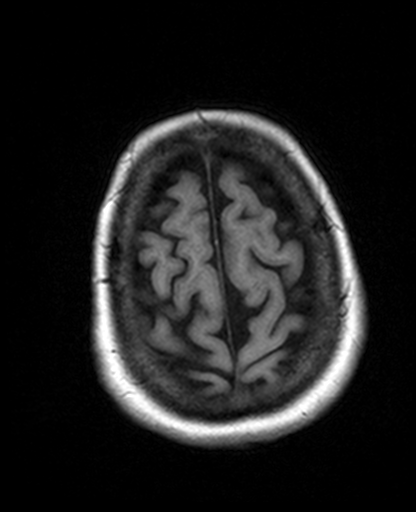
[im 76/95]
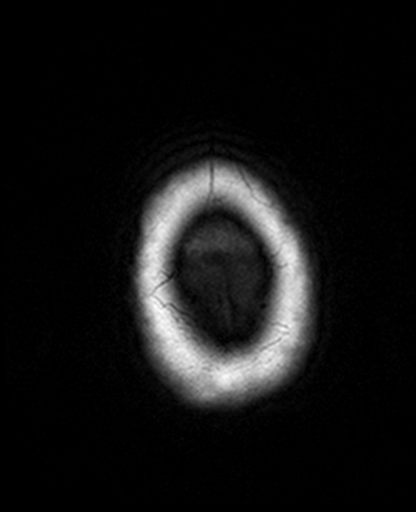
[im 95/95]
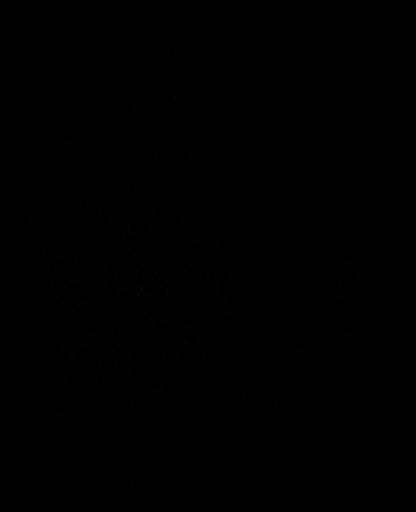

[Series 11: T2 · coronal · 5.0mm · 0.62mm/px · 3 of 28 slices shown (3 of 3)]
[im 1/28]
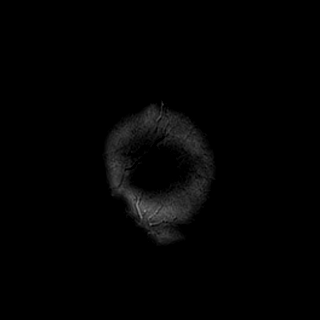
[im 14/28]
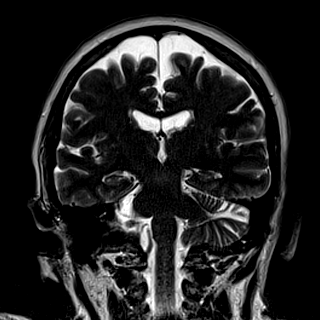
[im 28/28]
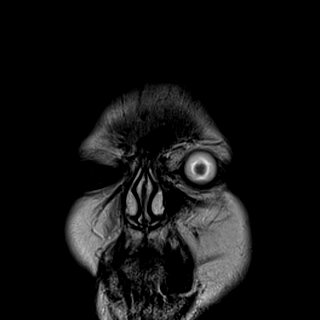

[37 of 48 positions shown; findings below may reference images not displayed]

FINDINGS: Brain: Cerebral volume within normal limits for patient age. No
focal parenchymal signal abnormality identified.

No abnormal foci of restricted diffusion to suggest acute or
subacute ischemia. Gray-white matter differentiation well
maintained. No encephalomalacia to suggest chronic infarction. No
foci of susceptibility artifact to suggest acute or chronic
intracranial hemorrhage.

No mass lesion, midline shift or mass effect. No hydrocephalus. No
extra-axial fluid collection. Major dural sinuses are grossly
patent.

Incidental note made of an empty sella. Suprasellar region normal.
Midline structures intact.

Vascular: Major intracranial vascular flow voids well maintained and
normal in appearance.

Skull and upper cervical spine: Craniocervical junction normal.
Visualized upper cervical spine within normal limits. Bone marrow
signal intensity normal. No scalp soft tissue abnormality.

Sinuses/Orbits: Globes and orbital soft tissues within normal
limits.

Paranasal sinuses are clear. No mastoid effusion. Inner ear
structures normal.

Other: None.
IMPRESSION: 1. No acute intracranial abnormality.
2. Empty sella. While this finding is often incidental in nature and
of no clinical significance, this can also be seen in the setting of
idiopathic intracranial hypertension.
3. Otherwise unremarkable brain MRI for age.

## 2021-11-03 DIAGNOSIS — L820 Inflamed seborrheic keratosis: Secondary | ICD-10-CM | POA: Diagnosis not present

## 2021-11-03 DIAGNOSIS — L718 Other rosacea: Secondary | ICD-10-CM | POA: Diagnosis not present

## 2021-11-03 DIAGNOSIS — L821 Other seborrheic keratosis: Secondary | ICD-10-CM | POA: Diagnosis not present

## 2021-11-03 DIAGNOSIS — L400 Psoriasis vulgaris: Secondary | ICD-10-CM | POA: Diagnosis not present

## 2021-11-25 DIAGNOSIS — G4733 Obstructive sleep apnea (adult) (pediatric): Secondary | ICD-10-CM | POA: Diagnosis not present

## 2021-12-17 ENCOUNTER — Other Ambulatory Visit: Payer: Self-pay

## 2021-12-17 ENCOUNTER — Ambulatory Visit
Admission: EM | Admit: 2021-12-17 | Discharge: 2021-12-17 | Disposition: A | Discharge status: Home / Self Care | Payer: BC Managed Care – PPO | Attending: Family Medicine | Admitting: Family Medicine

## 2021-12-17 DIAGNOSIS — J4521 Mild intermittent asthma with (acute) exacerbation: Secondary | ICD-10-CM | POA: Diagnosis not present

## 2021-12-17 DIAGNOSIS — J069 Acute upper respiratory infection, unspecified: Secondary | ICD-10-CM

## 2021-12-17 DIAGNOSIS — Z20828 Contact with and (suspected) exposure to other viral communicable diseases: Secondary | ICD-10-CM

## 2021-12-17 MED ORDER — PROMETHAZINE-DM 6.25-15 MG/5ML PO SYRP: 5.0000 mL | ORAL_SOLUTION | Freq: Four times a day (QID) | ORAL | 0 refills | Status: DC | PRN

## 2021-12-17 MED ORDER — PREDNISONE 20 MG PO TABS: 40.0000 mg | ORAL_TABLET | Freq: Every day | ORAL | 0 refills | Status: DC

## 2021-12-17 NOTE — ED Triage Notes (Signed)
Patient states she has been sick Thursday morning  Patient states he has a bad wet cough with mucus  Patient states she has tried cough and Alka Seltzer cold without any relief  Patient denies Fever

## 2021-12-17 NOTE — Discharge Instructions (Addendum)
Be aware, your blood sugars will rise while you are taking prednisone. Monitor appropriately.

## 2021-12-18 LAB — COVID-19, FLU A+B NAA
Influenza A, NAA: NOT DETECTED
Influenza B, NAA: NOT DETECTED
SARS-CoV-2, NAA: NOT DETECTED

## 2021-12-19 NOTE — ED Provider Notes (Signed)
[place]   [id number] 12/17/21 Arrival Time: 7517  ASSESSMENT & PLAN:  1. Mild intermittent asthma with acute exacerbation   2. Exposure to the flu   3. Viral URI with cough    Viral testing pending. No resp distress. Begin: Meds ordered this encounter  Medications   predniSONE (DELTASONE) 20 MG tablet    Sig: Take 2 tablets (40 mg total) by mouth daily.    Dispense:  10 tablet    Refill:  0   promethazine-dextromethorphan (PROMETHAZINE-DM) 6.25-15 MG/5ML syrup    Sig: Take 5 mLs by mouth 4 (four) times daily as needed for cough.    Dispense:  118 mL    Refill:  0   OTC symptom care as needed.  Recommend:  Follow-up Information     Celene Squibb, MD.   Specialty: Internal Medicine Why: As needed. Contact information: [facility] [address] [phone]                 Reviewed expectations re: course of current medical issues. Questions answered. Outlined signs and symptoms indicating need for more acute intervention. Patient verbalized understanding. After Visit Summary given.  SUBJECTIVE: History from: patient.  Lisa Burns is a 61 y.o. female who presents with complaint of coughing and wheezing; x 2-3 d; abrupt onset. No CP or specific SOB. With nasal congestion. Fever: none reported. Overall normal PO intake without n/v. Sick contacts: no. Ambulatory without difficulty. No LE edema. Typically her asthma is well controlled.   Social History   Tobacco Use  Smoking Status Former   Packs/day: 1.00   Years: 5.00   Pack years: 5.00   Types: Cigarettes  Smokeless Tobacco Never     OBJECTIVE:  Vitals:   12/17/21 1131  BP: (!) 165/81  Pulse: 80  Resp: 18  Temp: 97.7 F (36.5 C)  TempSrc: Oral  SpO2: 97%     General appearance: alert; NAD HEENT: [place]; AT; with nasal congestion Neck: supple without LAD Cv: RRR without murmer Lungs: unlabored respirations, moderate bilateral expiratory wheezing; cough:  mild; no significant respiratory distress Skin: warm and dry Psychological: alert and cooperative; normal mood and affect   Allergies  Allergen Reactions   Augmentin [Amoxicillin-Pot Clavulanate]     GI upset Did it involve swelling of the face/tongue/throat, SOB, or low BP? No Did it involve sudden or severe rash/hives, skin peeling, or any reaction on the inside of your mouth or nose? No Did you need to seek medical attention at a hospital or doctor's office? No When did it last happen?      15 years If all above answers are NO, may proceed with cephalosporin use.    Cinnamon Hives and Swelling   Coconut Flavor Hives and Swelling   Januvia [Sitagliptin]     GI upset   Metformin And Related     GI upset   Pravastatin     Pain in joints   Strawberry (Diagnostic) Hives and Swelling    Past Medical History:  Diagnosis Date   Allergy    Asthma    Diabetes mellitus with neuropathy ([place])    Diabetes mellitus without complication (HCC)    GERD (gastroesophageal reflux disease)    Hypertension    Microproteinuria    Reflux    Venous stasis    Family History  Problem Relation Age of Onset   Diabetes Mother    Heart disease Father    Cancer Father  lung   Colon cancer Father    Heart disease Sister    Diabetes Sister    Heart disease Brother    Diabetes Brother    Colon cancer Paternal Grandmother    Social History   Socioeconomic History   Marital status: Married    Spouse name: Not on file   Number of children: 1   Years of education: HS   Highest education level: Not on file  Occupational History   Occupation: [employer]   Tobacco Use   Smoking status: Former    Packs/day: 1.00    Years: 5.00    Pack years: 5.00    Types: Cigarettes   Smokeless tobacco: Never  Vaping Use   Vaping Use: Never used  Substance and Sexual Activity   Alcohol use: Yes    Alcohol/week: 0.0 standard drinks    Comment: occasionally   Drug use: No   Sexual  activity: Yes    Birth control/protection: None  Other Topics Concern   Not on file  Social History Narrative   Drinks 2 cans of soda a day    Social Determinants of Health   Financial Resource Strain: Not on file  Food Insecurity: Not on file  Transportation Needs: Not on file  Physical Activity: Not on file  Stress: Not on file  Social Connections: Not on file  Intimate Partner Violence: Not on file             Vanessa Kick, MD 12/19/21 1117

## 2021-12-26 DIAGNOSIS — G4733 Obstructive sleep apnea (adult) (pediatric): Secondary | ICD-10-CM | POA: Diagnosis not present

## 2021-12-28 DIAGNOSIS — K121 Other forms of stomatitis: Secondary | ICD-10-CM | POA: Diagnosis not present

## 2021-12-28 DIAGNOSIS — J029 Acute pharyngitis, unspecified: Secondary | ICD-10-CM | POA: Diagnosis not present

## 2021-12-28 DIAGNOSIS — R059 Cough, unspecified: Secondary | ICD-10-CM | POA: Diagnosis not present

## 2022-01-11 ENCOUNTER — Other Ambulatory Visit: Payer: Self-pay

## 2022-01-11 ENCOUNTER — Encounter: Payer: Self-pay | Admitting: Nurse Practitioner

## 2022-01-11 ENCOUNTER — Ambulatory Visit: Payer: BC Managed Care – PPO | Admitting: Nurse Practitioner

## 2022-01-11 VITALS — BP 125/71 | HR 84 | Ht 60.0 in | Wt 227.8 lb

## 2022-01-11 DIAGNOSIS — E119 Type 2 diabetes mellitus without complications: Secondary | ICD-10-CM

## 2022-01-11 DIAGNOSIS — E782 Mixed hyperlipidemia: Secondary | ICD-10-CM

## 2022-01-11 DIAGNOSIS — Z794 Long term (current) use of insulin: Secondary | ICD-10-CM | POA: Diagnosis not present

## 2022-01-11 DIAGNOSIS — I10 Essential (primary) hypertension: Secondary | ICD-10-CM

## 2022-01-11 LAB — POCT GLYCOSYLATED HEMOGLOBIN (HGB A1C): HbA1c, POC (controlled diabetic range): 8.5 % — AB (ref 0.0–7.0)

## 2022-01-11 MED ORDER — LANTUS SOLOSTAR 100 UNIT/ML ~~LOC~~ SOPN: 70.0000 [IU] | PEN_INJECTOR | Freq: Every day | SUBCUTANEOUS | 3 refills | Status: DC

## 2022-01-11 MED ORDER — GLIPIZIDE ER 5 MG PO TB24: 5.0000 mg | ORAL_TABLET | Freq: Every day | ORAL | 3 refills | Status: DC

## 2022-01-11 MED ORDER — TRULICITY 1.5 MG/0.5ML ~~LOC~~ SOAJ: 1.5000 mg | SUBCUTANEOUS | 3 refills | Status: DC

## 2022-01-11 NOTE — Patient Instructions (Signed)

## 2022-01-11 NOTE — Progress Notes (Signed)
01/11/2022                     Endocrinology follow-up note   Subjective:    Patient ID: Lisa Burns, female    DOB: 1961/08/11, PCP Celene Squibb, MD   Past Medical History:  Diagnosis Date   Allergy    Asthma    Diabetes mellitus with neuropathy ([place])    Diabetes mellitus without complication ([place])    GERD (gastroesophageal reflux disease)    Hypertension    Microproteinuria    Reflux    Venous stasis    Past Surgical History:  Procedure Laterality Date   CESAREAN SECTION     COLONOSCOPY N/A 04/24/2016   Procedure: COLONOSCOPY;  Surgeon: Danie Binder, MD;  Location: [facility];  Service: Endoscopy;  Laterality: N/A;  8:30 Am   COLONOSCOPY N/A 07/18/2019   Dr. Oneida Alar: 6 simple adenomas removed.  External/internal hemorrhoids.  Next colonoscopy in 3 years.   LAPAROSCOPIC NISSEN FUNDOPLICATION  3716   OTHER SURGICAL HISTORY     gerd surgery   POLYPECTOMY  07/18/2019   Procedure: POLYPECTOMY;  Surgeon: Danie Binder, MD;  Location: [facility];  Service: Endoscopy;;   THROAT SURGERY  2000   Social History   Socioeconomic History   Marital status: Married    Spouse name: Not on file   Number of children: 1   Years of education: HS   Highest education level: Not on file  Occupational History   Occupation: [employer]   Tobacco Use   Smoking status: Former    Packs/day: 1.00    Years: 5.00    Pack years: 5.00    Types: Cigarettes   Smokeless tobacco: Never  Vaping Use   Vaping Use: Never used  Substance and Sexual Activity   Alcohol use: Yes    Alcohol/week: 0.0 standard drinks    Comment: occasionally   Drug use: No   Sexual activity: Yes    Birth control/protection: None  Other Topics Concern   Not on file  Social History Narrative   Drinks 2 cans of soda a day    Social Determinants of Health   Financial Resource Strain: Not on file  Food Insecurity: Not on file  Transportation Needs: Not on file  Physical Activity: Not on file   Stress: Not on file  Social Connections: Not on file   Outpatient Encounter Medications as of 01/11/2022  Medication Sig   albuterol (PROAIR HFA) 108 (90 Base) MCG/ACT inhaler INHALE TWO PUFFS INTO THE LUNGS EVERY SIX HOURS AS NEEDED   Azelaic Acid 15 % cream Apply 1 application topically daily. Apply to face   clobetasol (TEMOVATE) 0.05 % external solution Apply 1 application topically 2 (two) times daily.   enalapril (VASOTEC) 20 MG tablet TAKE ONE TABLET (20MG  TOTAL) BY MOUTH ATBEDTIME   fluticasone (FLONASE) 50 MCG/ACT nasal spray Place 1 spray into both nostrils daily. (Patient taking differently: Place 1 spray into both nostrils daily as needed for allergies.)   fluticasone (FLOVENT HFA) 110 MCG/ACT inhaler INHALE TWO PUFFS INTO THE LUNGS TWICE DAILY   furosemide (LASIX) 40 MG tablet TAKE ONE TABLET (40MG  TOTAL) BY MOUTH EVERY MORNING   gabapentin (NEURONTIN) 400 MG capsule TAKE ONE (1) CAPSULE THREE (3) TIMES EACH DAY   glucose blood test strip Use as instructed   lansoprazole (PREVACID) 15 MG capsule Take 1 capsule (15 mg total) by mouth daily.   loratadine (CLARITIN) 10 MG  tablet Take 10 mg by mouth daily.    nortriptyline (PAMELOR) 50 MG capsule TAKE TWO CAPSULES BY MOUTH AT BEDTIME   REPATHA SURECLICK 353 MG/ML SOAJ INJECT 140MG  INTO THE SKIN EVERY 14 DAYS   valACYclovir (VALTREX) 500 MG tablet Take 500 mg by mouth 2 (two) times daily as needed (cold sore flare ups).    [DISCONTINUED] Dulaglutide (TRULICITY) 1.5 IR/4.4RX SOPN Inject 1.5 mg into the skin once a week.   [DISCONTINUED] glipiZIDE (GLUCOTROL XL) 5 MG 24 hr tablet Take 1 tablet (5 mg total) by mouth daily with breakfast.   [DISCONTINUED] insulin glargine (LANTUS SOLOSTAR) 100 UNIT/ML Solostar Pen Inject 70 Units into the skin at bedtime. INJECT 80 UNITS SUBCUTANEOUSLY AT BEDTIME.   Dulaglutide (TRULICITY) 1.5 VQ/0.0QQ SOPN Inject 1.5 mg into the skin once a week.   glipiZIDE (GLUCOTROL XL) 5 MG 24 hr tablet Take 1 tablet  (5 mg total) by mouth daily with breakfast.   insulin glargine (LANTUS SOLOSTAR) 100 UNIT/ML Solostar Pen Inject 70 Units into the skin at bedtime. INJECT 80 UNITS SUBCUTANEOUSLY AT BEDTIME.   [DISCONTINUED] predniSONE (DELTASONE) 20 MG tablet Take 2 tablets (40 mg total) by mouth daily.   [DISCONTINUED] promethazine-dextromethorphan (PROMETHAZINE-DM) 6.25-15 MG/5ML syrup Take 5 mLs by mouth 4 (four) times daily as needed for cough.   No facility-administered encounter medications on file as of 01/11/2022.   ALLERGIES: Allergies  Allergen Reactions   Augmentin [Amoxicillin-Pot Clavulanate]     GI upset Did it involve swelling of the face/tongue/throat, SOB, or low BP? No Did it involve sudden or severe rash/hives, skin peeling, or any reaction on the inside of your mouth or nose? No Did you need to seek medical attention at a hospital or doctor's office? No When did it last happen?      15 years If all above answers are NO, may proceed with cephalosporin use.    Cinnamon Hives and Swelling   Coconut Flavor Hives and Swelling   Januvia [Sitagliptin]     GI upset   Metformin Diarrhea   Metformin And Related     GI upset   Pravastatin     Pain in joints   Strawberry (Diagnostic) Hives and Swelling   VACCINATION STATUS: Immunization History  Administered Date(s) Administered   Influenza, Seasonal, Injecte, Preservative Fre 08/09/2016   Influenza,inj,Quad PF,6+ Mos 12/27/2017   PFIZER(Purple Top)SARS-COV-2 Vaccination 03/11/2020, 04/11/2020   Pneumococcal Polysaccharide-23 12/30/2013    Diabetes She presents for her follow-up diabetic visit. She has type 2 diabetes mellitus. Onset time: She was diagnosed at approximate age of 50 years. Her disease course has been worsening. There are no hypoglycemic associated symptoms. Pertinent negatives for hypoglycemia include no confusion, headaches, pallor or seizures. There are no diabetic associated symptoms. Pertinent negatives for  diabetes include no chest pain, no polydipsia, no polyphagia, no polyuria and no weight loss. There are no hypoglycemic complications. Symptoms are stable. There are no diabetic complications. Risk factors for coronary artery disease include diabetes mellitus, dyslipidemia, family history, hypertension, obesity, sedentary lifestyle and tobacco exposure. Current diabetic treatment includes insulin injections and oral agent (monotherapy) (And Trulicity). She is compliant with treatment most of the time. Her weight is increasing steadily. She is following a generally unhealthy diet. When asked about meal planning, she reported none. She has not had a previous visit with a dietitian. She never participates in exercise. Her home blood glucose trend is increasing steadily. Her breakfast blood glucose range is generally 180-200 mg/dl. Her overall blood glucose  range is 180-200 mg/dl. (She presents today with her logs, no meter, showing above target fasting glycemic profile.  Her POCT A1c today is 8.5%, increasing from previous visit of 6.4%.  She does report being on oral steroids for a period of time for URI and had also run out of Trulicity for several weeks prior to getting new savings coupon to have it renewed.  She denies any hypoglycemia.) An ACE inhibitor/angiotensin II receptor blocker is being taken. She sees a podiatrist.Eye exam is current.  Hyperlipidemia This is a chronic problem. The current episode started more than 1 year ago. The problem is controlled. Recent lipid tests were reviewed and are normal. Exacerbating diseases include chronic renal disease, diabetes and obesity. Factors aggravating her hyperlipidemia include fatty foods. Pertinent negatives include no chest pain, myalgias or shortness of breath. Treatments tried: she does not tolerate statins; is on Repatha. The current treatment provides no improvement of lipids. Compliance problems include medication side effects, adherence to diet and  adherence to exercise.  Risk factors for coronary artery disease include diabetes mellitus, dyslipidemia, hypertension, obesity and a sedentary lifestyle.  Hypertension This is a chronic problem. The current episode started more than 1 year ago. The problem has been gradually improving since onset. The problem is controlled. Associated symptoms include peripheral edema. Pertinent negatives include no chest pain, headaches, palpitations or shortness of breath. There are no associated agents to hypertension. Risk factors for coronary artery disease include dyslipidemia, diabetes mellitus, obesity, sedentary lifestyle and post-menopausal state. Past treatments include ACE inhibitors and diuretics. The current treatment provides mild improvement. Compliance problems include diet and exercise.  Hypertensive end-organ damage includes kidney disease. Identifiable causes of hypertension include chronic renal disease.    Review of systems  Constitutional: + steadily increasing body weight,  current Body mass index is 44.49 kg/m. , no fatigue, no subjective hyperthermia, no subjective hypothermia Eyes: no blurry vision, no xerophthalmia ENT: no sore throat, no nodules palpated in throat, no dysphagia/odynophagia, no hoarseness Cardiovascular: no chest pain, no shortness of breath, no palpitations, no leg swelling Respiratory: no cough, no shortness of breath Gastrointestinal: no nausea/vomiting/diarrhea Musculoskeletal: no muscle/joint aches Skin: no rashes, no hyperemia Neurological: no tremors, no numbness, no tingling, no dizziness Psychiatric: no depression, no anxiety    Objective:    BP 125/71    Pulse 84    Ht 5' (1.524 m)    Wt 227 lb 12.8 oz (103.3 kg)    SpO2 97%    BMI 44.49 kg/m   Wt Readings from Last 3 Encounters:  01/11/22 227 lb 12.8 oz (103.3 kg)  07/11/21 219 lb (99.3 kg)  01/10/21 217 lb 3.2 oz (98.5 kg)    BP Readings from Last 3 Encounters:  01/11/22 125/71  12/17/21 (!)  165/81  07/11/21 (!) 143/86     Physical Exam- Limited  Constitutional:  Body mass index is 44.49 kg/m. , not in acute distress, normal state of mind Eyes:  EOMI, no exophthalmos Neck: Supple Cardiovascular: RRR, no murmurs, rubs, or gallops, no edema Respiratory: Adequate breathing efforts, no crackles, rales, rhonchi, or wheezing Musculoskeletal: no gross deformities, strength intact in all four extremities, no gross restriction of joint movements Skin:  no rashes, no hyperemia Neurological: no tremor with outstretched hands    Results for orders placed or performed in visit on 01/11/22  POCT glycosylated hemoglobin (Hb A1C)  Result Value Ref Range   Hemoglobin A1C     HbA1c POC (<> result, manual entry)  HbA1c, POC (prediabetic range)     HbA1c, POC (controlled diabetic range) 8.5 (A) 0.0 - 7.0 %   Diabetic Labs (most recent): Lab Results  Component Value Date   HGBA1C 8.5 (A) 01/11/2022   HGBA1C 5.6 07/11/2021   HGBA1C 6.0 01/10/2021   Lipid Panel     Component Value Date/Time   CHOL 112 07/07/2021 0720   TRIG 96 07/07/2021 0720   HDL 60 07/07/2021 0720   CHOLHDL 1.9 07/07/2021 0720   VLDL 43 (H) 11/29/2016 0729   LDLCALC 34 07/07/2021 0720     Assessment & Plan:   1) Uncontrolled type 2 diabetes mellitus with complication.  She presents today with her logs, no meter, showing above target fasting glycemic profile.  Her POCT A1c today is 8.5%, increasing from previous visit of 6.4%.  She does report being on oral steroids for a period of time for URI and had also run out of Trulicity for several weeks prior to getting new savings coupon to have it renewed.  She denies any hypoglycemia.  - She  remains at a high risk for more acute and chronic complications of diabetes which include CAD, CVA, CKD, retinopathy, and neuropathy. These are all discussed in detail with the patient.    Recent labs reviewed.  - Nutritional counseling repeated at each appointment  due to patients tendency to fall back in to old habits.  - The patient admits there is a room for improvement in their diet and drink choices. -  Suggestion is made for the patient to avoid simple carbohydrates from their diet including Cakes, Sweet Desserts / Pastries, Ice Cream, Soda (diet and regular), Sweet Tea, Candies, Chips, Cookies, Sweet Pastries, Store Bought Juices, Alcohol in Excess of 1-2 drinks a day, Artificial Sweeteners, Coffee Creamer, and "Sugar-free" Products. This will help patient to have stable blood glucose profile and potentially avoid unintended weight gain.   - I encouraged the patient to switch to unprocessed or minimally processed complex starch and increased protein intake (animal or plant source), fruits, and vegetables.   - Patient is advised to stick to a routine mealtimes to eat 3 meals a day and avoid unnecessary snacks (to snack only to correct hypoglycemia).  - I have approached patient with the following individualized plan to manage diabetes and patient agrees.  -Although her A1c is worse today, no changes will be made to her medications today.  Instead she is advised to get back on track with diet and exercise first.  She is advised to continue  Lantus 70 units SQ nightly, Trulicity 1.5 mg SQ weekly and Glipizide 5 mg XL daily with breakfast.   -She is encouraged to continue monitoring blood glucose at least twice daily, before breakfast and before bed, and call the clinic if she has readings less than 70 or greater than 200 for 3 tests in a row.  - Patient specific target  for A1c; LDL, HDL, Triglycerides, and  Waist Circumference were discussed in detail.  2) BP/HTN: Her blood pressure is controlled to target.  She is advised to continue Enalapril 20 mg po daily and Lasix 40 mg po daily.    3) Lipids/HPL:  Her recent lipid panel from 07/07/21 shows controlled LDL of 34.  She does not tolerate statins.  She is advised to continue Repatha 140 mg SQ every 2  weeks.  Her new PCP recently checked blood work.  I asked that she have them send [place] a copy for our records to avoid  duplicate efforts.  4)  Weight/Diet:  Her Body mass index is 44.49 kg/m.--  She is a candidate for modest weight loss.  Exercise, and carbohydrates information provided.  5) Chronic Care/Health Maintenance: -Patient is on ACEI/ARB medications and encouraged to continue to follow up with Ophthalmology, Podiatrist at least yearly or according to recommendations, and advised to  stay away from smoking. I have recommended yearly flu vaccine and pneumonia vaccination at least every 5 years; moderate intensity exercise for up to 150 minutes weekly; and  sleep for at least 7 hours a day.  - I advised patient to maintain close follow up with Celene Squibb, MD for primary care needs.      I spent 30 minutes in the care of the patient today including review of labs from [place], Lipids, Thyroid Function, Hematology (current and previous including abstractions from other facilities); face-to-face time discussing  her blood glucose readings/logs, discussing hypoglycemia and hyperglycemia episodes and symptoms, medications doses, her options of short and long term treatment based on the latest standards of care / guidelines;  discussion about incorporating lifestyle medicine;  and documenting the encounter.    Please refer to Patient Instructions for Blood Glucose Monitoring and Insulin/Medications Dosing Guide"  in media tab for additional information. Please  also refer to " Patient Self Inventory" in the Media  tab for reviewed elements of pertinent patient history.  Mearl Latin Fiorella participated in the discussions, expressed understanding, and voiced agreement with the above plans.  All questions were answered to her satisfaction. she is encouraged to contact clinic should she have any questions or concerns prior to her return visit.    Follow up plan: -Return in about 4 months (around  05/11/2022) for Diabetes F/U- A1c and UM in office, Bring meter and logs, No previsit labs.    Rayetta Pigg, [facility] [address] [place], [place] [address] Phone: [phone] Fax: [phone]  01/11/2022, 3:50 PM

## 2022-01-16 ENCOUNTER — Other Ambulatory Visit: Payer: Self-pay | Admitting: Nurse Practitioner

## 2022-01-16 DIAGNOSIS — E119 Type 2 diabetes mellitus without complications: Secondary | ICD-10-CM

## 2022-01-16 DIAGNOSIS — Z794 Long term (current) use of insulin: Secondary | ICD-10-CM

## 2022-01-23 DIAGNOSIS — G4733 Obstructive sleep apnea (adult) (pediatric): Secondary | ICD-10-CM | POA: Diagnosis not present

## 2022-01-31 ENCOUNTER — Telehealth: Payer: Self-pay

## 2022-01-31 NOTE — Telephone Encounter (Signed)
Started prior authorization for Repatha SureClick 754 mg/mL. ?Key # [id number] ?

## 2022-02-07 NOTE — Telephone Encounter (Signed)
Received approval for Repatha 140 mg/mL ?Approved through 02/07/2022 through 02/07/2023. ?

## 2022-02-23 DIAGNOSIS — G4733 Obstructive sleep apnea (adult) (pediatric): Secondary | ICD-10-CM | POA: Diagnosis not present

## 2022-03-25 DIAGNOSIS — G4733 Obstructive sleep apnea (adult) (pediatric): Secondary | ICD-10-CM | POA: Diagnosis not present

## 2022-03-30 DIAGNOSIS — E119 Type 2 diabetes mellitus without complications: Secondary | ICD-10-CM | POA: Diagnosis not present

## 2022-03-30 DIAGNOSIS — E785 Hyperlipidemia, unspecified: Secondary | ICD-10-CM | POA: Diagnosis not present

## 2022-03-30 DIAGNOSIS — I10 Essential (primary) hypertension: Secondary | ICD-10-CM | POA: Diagnosis not present

## 2022-03-30 LAB — BASIC METABOLIC PANEL
BUN: 13 (ref 4–21)
CO2: 27 — AB (ref 13–22)
Chloride: 103 (ref 99–108)
Creatinine: 0.8 (ref 0.5–1.1)
Glucose: 92
Potassium: 3.8 mEq/L (ref 3.5–5.1)
Sodium: 142 (ref 137–147)

## 2022-03-30 LAB — LIPID PANEL
Cholesterol: 93 (ref 0–200)
HDL: 50 (ref 35–70)
LDL Cholesterol: 21
Triglycerides: 122 (ref 40–160)

## 2022-03-30 LAB — HEPATIC FUNCTION PANEL
ALT: 21 U/L (ref 7–35)
AST: 22 (ref 13–35)
Alkaline Phosphatase: 119 (ref 25–125)
Bilirubin, Total: 0.2

## 2022-03-30 LAB — COMPREHENSIVE METABOLIC PANEL
Albumin: 4.1 (ref 3.5–5.0)
Calcium: 9.2 (ref 8.7–10.7)
Globulin: 1.9

## 2022-03-30 LAB — MICROALBUMIN, URINE: Microalb, Ur: 10

## 2022-03-30 LAB — HEMOGLOBIN A1C: Hemoglobin A1C: 6.8

## 2022-04-03 DIAGNOSIS — G629 Polyneuropathy, unspecified: Secondary | ICD-10-CM | POA: Diagnosis not present

## 2022-04-03 DIAGNOSIS — E119 Type 2 diabetes mellitus without complications: Secondary | ICD-10-CM | POA: Diagnosis not present

## 2022-04-03 DIAGNOSIS — I10 Essential (primary) hypertension: Secondary | ICD-10-CM | POA: Diagnosis not present

## 2022-04-03 DIAGNOSIS — E785 Hyperlipidemia, unspecified: Secondary | ICD-10-CM | POA: Diagnosis not present

## 2022-04-06 DIAGNOSIS — M25562 Pain in left knee: Secondary | ICD-10-CM | POA: Diagnosis not present

## 2022-04-06 DIAGNOSIS — M545 Low back pain, unspecified: Secondary | ICD-10-CM | POA: Diagnosis not present

## 2022-04-21 ENCOUNTER — Ambulatory Visit: Payer: BC Managed Care – PPO | Admitting: Orthopedic Surgery

## 2022-04-25 DIAGNOSIS — G4733 Obstructive sleep apnea (adult) (pediatric): Secondary | ICD-10-CM | POA: Diagnosis not present

## 2022-04-26 ENCOUNTER — Encounter: Payer: Self-pay | Admitting: Cardiology

## 2022-04-26 ENCOUNTER — Ambulatory Visit: Payer: BC Managed Care – PPO | Admitting: Cardiology

## 2022-04-26 VITALS — BP 140/80 | HR 85 | Ht 60.0 in | Wt 223.0 lb

## 2022-04-26 DIAGNOSIS — R011 Cardiac murmur, unspecified: Secondary | ICD-10-CM

## 2022-04-26 DIAGNOSIS — Z8249 Family history of ischemic heart disease and other diseases of the circulatory system: Secondary | ICD-10-CM

## 2022-04-26 DIAGNOSIS — I10 Essential (primary) hypertension: Secondary | ICD-10-CM

## 2022-04-26 DIAGNOSIS — R079 Chest pain, unspecified: Secondary | ICD-10-CM

## 2022-04-26 DIAGNOSIS — E782 Mixed hyperlipidemia: Secondary | ICD-10-CM

## 2022-04-26 DIAGNOSIS — R072 Precordial pain: Secondary | ICD-10-CM | POA: Diagnosis not present

## 2022-04-26 MED ORDER — METOPROLOL TARTRATE 100 MG PO TABS: 100.0000 mg | ORAL_TABLET | Freq: Once | ORAL | 0 refills | Status: DC

## 2022-04-26 NOTE — Progress Notes (Signed)
Cardiology Office Note:    Date:  04/26/2022   ID:  Lisa Burns, DOB Jul 12, 1961, MRN [id number]  PCP:  Celene Squibb, MD   [facility] Providers Cardiologist:  Candee Furbish, MD     Referring MD: Celene Squibb, MD    History of Present Illness:    Lisa Burns is a 61 y.o. female here for the evaluation of heart murmur at the request of Dr. [place] Burns.  Has diabetes as well as strong family history of cardiac disease.  She is a former smoker 1 pack/day for 20 years.  A new cardiac murmur was heard at her last primary care office visit.  Her father had heart attack in his late 41s.  Brother and sister both had CABG when they were in their 50s. Sister died at 35.   Sometimes SOB. Occasional chest discomfort. Went to [employer] a few years ago with pressure, arm hurt.   On Repatha - LDL 21      Past Medical History:  Diagnosis Date   Acute pharyngitis    Allergy    Asthma    Constipation    Cough    Diabetes mellitus with neuropathy (HCC)    Diabetes mellitus without complication (HCC)    GERD (gastroesophageal reflux disease)    Heart murmur    Heart murmur    Hyperlipidemia    Hypertension    Microproteinuria    Neuropathy    Obesity    OSA (obstructive sleep apnea)    Proteinuria    Reflux    Snoring    Ulcer of mouth    URI (upper respiratory infection)    Venous stasis     Past Surgical History:  Procedure Laterality Date   CESAREAN SECTION     COLONOSCOPY N/A 04/24/2016   Procedure: COLONOSCOPY;  Surgeon: Danie Binder, MD;  Location: [facility];  Service: Endoscopy;  Laterality: N/A;  8:30 Am   COLONOSCOPY N/A 07/18/2019   Dr. Oneida Alar: 6 simple adenomas removed.  External/internal hemorrhoids.  Next colonoscopy in 3 years.   LAPAROSCOPIC NISSEN FUNDOPLICATION  7096   OTHER SURGICAL HISTORY     gerd surgery   POLYPECTOMY  07/18/2019   Procedure: POLYPECTOMY;  Surgeon: Danie Binder, MD;  Location: [facility];  Service: Endoscopy;;   THROAT SURGERY   2000    Current Medications: Current Meds  Medication Sig   albuterol (PROAIR HFA) 108 (90 Base) MCG/ACT inhaler INHALE TWO PUFFS INTO THE LUNGS EVERY SIX HOURS AS NEEDED   Azelaic Acid 15 % cream Apply 1 application topically daily. Apply to face   clobetasol (TEMOVATE) 0.05 % external solution Apply 1 application topically 2 (two) times daily.   Dulaglutide (TRULICITY) 1.5 GE/3.6OQ SOPN Inject 1.5 mg into the skin once a week.   enalapril (VASOTEC) 20 MG tablet TAKE ONE TABLET ('20MG'$  TOTAL) BY MOUTH ATBEDTIME   fluticasone (FLONASE) 50 MCG/ACT nasal spray Place 1 spray into both nostrils daily.   fluticasone (FLOVENT HFA) 110 MCG/ACT inhaler INHALE TWO PUFFS INTO THE LUNGS TWICE DAILY   furosemide (LASIX) 40 MG tablet TAKE ONE TABLET ('40MG'$  TOTAL) BY MOUTH EVERY MORNING   gabapentin (NEURONTIN) 400 MG capsule TAKE ONE (1) CAPSULE THREE (3) TIMES EACH DAY   glipiZIDE (GLUCOTROL XL) 5 MG 24 hr tablet Take 1 tablet (5 mg total) by mouth daily with breakfast.   glucose blood test strip Use as instructed   insulin glargine (LANTUS SOLOSTAR) 100 UNIT/ML Solostar  Pen Inject 70 Units into the skin at bedtime.   lansoprazole (PREVACID) 15 MG capsule Take 1 capsule (15 mg total) by mouth daily.   loratadine (CLARITIN) 10 MG tablet Take 10 mg by mouth daily.    metoprolol tartrate (LOPRESSOR) 100 MG tablet Take 1 tablet (100 mg total) by mouth once for 1 dose. Take 90-120 minutes prior to scan.   nortriptyline (PAMELOR) 50 MG capsule TAKE TWO CAPSULES BY MOUTH AT BEDTIME   REPATHA SURECLICK 790 MG/ML SOAJ INJECT '140MG'$  INTO THE SKIN EVERY 14 DAYS   valACYclovir (VALTREX) 500 MG tablet Take 500 mg by mouth 2 (two) times daily as needed (cold sore flare ups).      Allergies:   Augmentin [amoxicillin-pot clavulanate], Cinnamon, Coconut flavor, Januvia [sitagliptin], Metformin, Metformin and related, Pravastatin, and Strawberry (diagnostic)   Social History   Socioeconomic History   Marital status:  Married    Spouse name: Not on file   Number of children: 1   Years of education: HS   Highest education level: Not on file  Occupational History   Occupation: [employer]   Tobacco Use   Smoking status: Former    Packs/day: 1.00    Years: 5.00    Pack years: 5.00    Types: Cigarettes   Smokeless tobacco: Never  Vaping Use   Vaping Use: Never used  Substance and Sexual Activity   Alcohol use: Yes    Alcohol/week: 0.0 standard drinks    Comment: occasionally   Drug use: No   Sexual activity: Yes    Birth control/protection: None  Other Topics Concern   Not on file  Social History Narrative   Drinks 2 cans of soda a day    Social Determinants of Health   Financial Resource Strain: Not on file  Food Insecurity: Not on file  Transportation Needs: Not on file  Physical Activity: Not on file  Stress: Not on file  Social Connections: Not on file     Family History: The patient's family history includes Cancer in her father; Colon cancer in her father and paternal grandmother; Diabetes in her brother, mother, and sister; Heart disease in her brother, father, and sister.  ROS:   Please see the history of present illness.    No fevers chills nausea vomiting syncope bleeding all other systems reviewed and are negative.  EKGs/Labs/Other Studies Reviewed:    The following studies were reviewed today:   EKG:  EKG is  ordered today.  The ekg ordered today demonstrates normal sinus rhythm 85 nonspecific ST-T wave change subtle T wave inversion in 1 and aVL.  Recent Labs: 07/07/2021: Hemoglobin 12.2; Platelets 238 03/30/2022: ALT 21; BUN 13; Creatinine 0.8; Potassium 3.8; Sodium 142  Recent Lipid Panel    Component Value Date/Time   CHOL 93 03/30/2022 0000   TRIG 122 03/30/2022 0000   HDL 50 03/30/2022 0000   CHOLHDL 1.9 07/07/2021 0720   VLDL 43 (H) 11/29/2016 0729   LDLCALC 21 03/30/2022 0000   LDLCALC 34 07/07/2021 0720     Risk Assessment/Calculations:               Physical Exam:    VS:  BP 140/80 (BP Location: Left Arm, Patient Position: Sitting, Cuff Size: Normal)   Pulse 85   Ht 5' (1.524 m)   Wt 223 lb (101.2 kg)   BMI 43.55 kg/m     Wt Readings from Last 3 Encounters:  04/26/22 223 lb (101.2 kg)  01/11/22 227  lb 12.8 oz (103.3 kg)  07/11/21 219 lb (99.3 kg)     GEN: Well nourished, well developed in no acute distress HEENT: Normal NECK: No JVD; No carotid bruits LYMPHATICS: No lymphadenopathy CARDIAC: RRR, 2/6 systolic murmur, no rubs, gallops RESPIRATORY:  Clear to auscultation without rales, wheezing or rhonchi  ABDOMEN: Soft, non-tender, non-distended MUSCULOSKELETAL:  No edema; No deformity  SKIN: Warm and dry NEUROLOGIC:  Alert and oriented x 3 PSYCHIATRIC:  Normal affect   ASSESSMENT:    1. Chest pain, unspecified type   2. Heart murmur   3. Precordial chest pain   4. Mixed hyperlipidemia   5. Essential hypertension, benign   6. Family history of early CAD    PLAN:    In order of problems listed above:  Heart murmur We will check an echocardiogram.  Systolic murmur appreciated.  Precordial chest pain Has had an occasional episode of centralized chest discomfort, pressure, even had left arm radiation at 1 point.  She went to the emergency department in the past for this.  I think given her sister, father, brother with strong family history of CAD we should go ahead and proceed with coronary CT scan for further analysis.  Mixed hyperlipidemia Excellent use of Repatha.  LDL 21.  Essential hypertension, benign Mildly elevated today here in the office.  Continue to monitor.  Usually under good control.  Family history of early CAD Father sister brother very strong family history.   EKG findings as above.     Medication Adjustments/Labs and Tests Ordered: Current medicines are reviewed at length with the patient today.  Concerns regarding medicines are outlined above.  Orders Placed This Encounter   Procedures   CT CORONARY MORPH W/CTA COR W/SCORE W/CA W/CM &/OR WO/CM   EKG 12-Lead   ECHOCARDIOGRAM COMPLETE   Meds ordered this encounter  Medications   metoprolol tartrate (LOPRESSOR) 100 MG tablet    Sig: Take 1 tablet (100 mg total) by mouth once for 1 dose. Take 90-120 minutes prior to scan.    Dispense:  1 tablet    Refill:  0    Patient Instructions  Medication Instructions:  Your physician recommends that you continue on your current medications as directed. Please refer to the Current Medication list given to you today.  *If you need a refill on your cardiac medications before your next appointment, please call your pharmacy*  Lab Work: NONE  Testing/Procedures: Your physician has requested you have a coronary CTA performed.  Your physician has requested that you have an echocardiogram. Echocardiography is a painless test that uses sound waves to create images of your heart. It provides your doctor with information about the size and shape of your heart and how well your heart's chambers and valves are working. This procedure takes approximately one hour. There are no restrictions for this procedure.  Follow-Up: At [facility], you and your health needs are our priority.  As part of our continuing mission to provide you with exceptional heart care, we have created designated Provider Care Teams.  These Care Teams include your primary Cardiologist (physician) and Advanced Practice Providers (APPs -  Physician Assistants and Nurse Practitioners) who all work together to provide you with the care you need, when you need it.  Your next appointment:   As needed  The format for your next appointment:   In Person  Provider:   Candee Furbish, MD {  Other Instructions   Your cardiac CT will be scheduled at the  below location:   [facility] [address] [place], [place] [address] [phone]  Please arrive at the [facility] and Children's Entrance  (Entrance C2) of [facility] 30 minutes prior to test start time. You can use the FREE valet parking offered at entrance C (encouraged to control the heart rate for the test)  Proceed to the [facility] Radiology Department (first floor) to check-in and test prep.  All radiology patients and guests should use entrance C2 at [facility], accessed from [facility], even though the hospital's physical address listed is [address].     Please follow these instructions carefully (unless otherwise directed):  On the Night Before the Test: Be sure to Drink plenty of water. Do not consume any caffeinated/decaffeinated beverages or chocolate 12 hours prior to your test. Do not take any antihistamines 12 hours prior to your test.  On the Day of the Test: Drink plenty of water until 1 hour prior to the test. Do not eat any food 4 hours prior to the test. You may take your regular medications prior to the test.  Take metoprolol (Lopressor) '100mg'$  two hours prior to test. HOLD Furosemide morning of the test. FEMALES- please wear underwire-free bra if available, avoid dresses & tight clothing      After the Test: Drink plenty of water. After receiving IV contrast, you may experience a mild flushed feeling. This is normal. On occasion, you may experience a mild rash up to 24 hours after the test. This is not dangerous. If this occurs, you can take Benadryl 25 mg and increase your fluid intake. If you experience trouble breathing, this can be serious. If it is severe call 911 IMMEDIATELY. If it is mild, please call our office. If you take any of these medications: Glipizide/Metformin, Avandament, Glucavance, please do not take 48 hours after completing test unless otherwise instructed.  We will call to schedule your test 2-4 weeks out understanding that some insurance companies will need an authorization prior to the service being performed.   For non-scheduling  related questions, please contact the cardiac imaging nurse navigator should you have any questions/concerns: Marchia Bond, Cardiac Imaging Nurse Navigator Gordy Clement, Cardiac Imaging Nurse Navigator [place] [facility] Direct Office Dial: [phone]   For scheduling needs, including cancellations and rescheduling, please call [place], [phone].   Important Information About Sugar         Signed, Candee Furbish, MD  04/26/2022 5:07 PM    [facility]

## 2022-04-26 NOTE — Assessment & Plan Note (Signed)
Mildly elevated today here in the office.  Continue to monitor.  Usually under good control.

## 2022-04-26 NOTE — Assessment & Plan Note (Signed)
Has had an occasional episode of centralized chest discomfort, pressure, even had left arm radiation at 1 point.  She went to the emergency department in the past for this.  I think given her sister, father, brother with strong family history of CAD we should go ahead and proceed with coronary CT scan for further analysis.

## 2022-04-26 NOTE — Patient Instructions (Addendum)
Medication Instructions:  Your physician recommends that you continue on your current medications as directed. Please refer to the Current Medication list given to you today.  *If you need a refill on your cardiac medications before your next appointment, please call your pharmacy*  Lab Work: NONE  Testing/Procedures: Your physician has requested you have a coronary CTA performed.  Your physician has requested that you have an echocardiogram. Echocardiography is a painless test that uses sound waves to create images of your heart. It provides your doctor with information about the size and shape of your heart and how well your heart's chambers and valves are working. This procedure takes approximately one hour. There are no restrictions for this procedure.  Follow-Up: At [facility], you and your health needs are our priority.  As part of our continuing mission to provide you with exceptional heart care, we have created designated Provider Care Teams.  These Care Teams include your primary Cardiologist (physician) and Advanced Practice Providers (APPs -  Physician Assistants and Nurse Practitioners) who all work together to provide you with the care you need, when you need it.  Your next appointment:   As needed  The format for your next appointment:   In Person  Provider:   Candee Furbish, MD {  Other Instructions   Your cardiac CT will be scheduled at the below location:   [facility] [place], [place] [address] [phone]  Please arrive at the [facility] and Children's Entrance (Entrance C2) of [facility] 30 minutes prior to test start time. You can use the FREE valet parking offered at entrance C (encouraged to control the heart rate for the test)  Proceed to the [facility] Radiology Department (first floor) to check-in and test prep.  All radiology patients and guests should use entrance C2 at [facility], accessed from [facility], even though the hospital's physical address listed is [address]     Please follow these instructions carefully (unless otherwise directed):  On the Night Before the Test: Be sure to Drink plenty of water. Do not consume any caffeinated/decaffeinated beverages or chocolate 12 hours prior to your test. Do not take any antihistamines 12 hours prior to your test.  On the Day of the Test: Drink plenty of water until 1 hour prior to the test. Do not eat any food 4 hours prior to the test. You may take your regular medications prior to the test.  Take metoprolol (Lopressor) '100mg'$  two hours prior to test. HOLD Furosemide morning of the test. FEMALES- please wear underwire-free bra if available, avoid dresses & tight clothing      After the Test: Drink plenty of water. After receiving IV contrast, you may experience a mild flushed feeling. This is normal. On occasion, you may experience a mild rash up to 24 hours after the test. This is not dangerous. If this occurs, you can take Benadryl 25 mg and increase your fluid intake. If you experience trouble breathing, this can be serious. If it is severe call 911 IMMEDIATELY. If it is mild, please call our office. If you take any of these medications: Glipizide/Metformin, Avandament, Glucavance, please do not take 48 hours after completing test unless otherwise instructed.  We will call to schedule your test 2-4 weeks out understanding that some insurance companies will need an authorization prior to the service being performed.   For non-scheduling related questions, please contact the cardiac imaging nurse navigator  should you have any questions/concerns: Marchia Bond, Cardiac Imaging Nurse Navigator Gordy Clement, Cardiac Imaging Nurse Navigator [facility] Direct Office Dial: [phone]   For scheduling needs, including cancellations and rescheduling, please call [place],  [phone].   Important Information About Sugar

## 2022-04-26 NOTE — Assessment & Plan Note (Signed)
Excellent use of Repatha.  LDL 21.

## 2022-04-26 NOTE — Assessment & Plan Note (Signed)
We will check an echocardiogram.  Systolic murmur appreciated.

## 2022-04-26 NOTE — Assessment & Plan Note (Signed)
Father sister brother very strong family history.

## 2022-04-27 DIAGNOSIS — M5136 Other intervertebral disc degeneration, lumbar region: Secondary | ICD-10-CM | POA: Diagnosis not present

## 2022-04-27 DIAGNOSIS — M722 Plantar fascial fibromatosis: Secondary | ICD-10-CM | POA: Diagnosis not present

## 2022-04-27 DIAGNOSIS — M25562 Pain in left knee: Secondary | ICD-10-CM | POA: Diagnosis not present

## 2022-05-08 DIAGNOSIS — H04123 Dry eye syndrome of bilateral lacrimal glands: Secondary | ICD-10-CM | POA: Diagnosis not present

## 2022-05-08 DIAGNOSIS — H2513 Age-related nuclear cataract, bilateral: Secondary | ICD-10-CM | POA: Diagnosis not present

## 2022-05-08 DIAGNOSIS — E119 Type 2 diabetes mellitus without complications: Secondary | ICD-10-CM | POA: Diagnosis not present

## 2022-05-08 DIAGNOSIS — Z794 Long term (current) use of insulin: Secondary | ICD-10-CM | POA: Diagnosis not present

## 2022-05-08 LAB — HM DIABETES EYE EXAM

## 2022-05-10 NOTE — Patient Instructions (Signed)
Diabetes Mellitus and Foot Care Foot care is an important part of your health, especially when you have diabetes. Diabetes may cause you to have problems because of poor blood flow (circulation) to your feet and legs, which can cause your skin to: Become thinner and drier. Break more easily. Heal more slowly. Peel and crack. You may also have nerve damage (neuropathy) in your legs and feet, causing decreased feeling in them. This means that you may not notice minor injuries to your feet that could lead to more serious problems. Noticing and addressing any potential problems early is the best way to prevent future foot problems. How to care for your feet Foot hygiene  Wash your feet daily with warm water and mild soap. Do not use hot water. Then, pat your feet and the areas between your toes until they are completely dry. Do not soak your feet as this can dry your skin. Trim your toenails straight across. Do not dig under them or around the cuticle. File the edges of your nails with an emery board or nail file. Apply a moisturizing lotion or petroleum jelly to the skin on your feet and to dry, brittle toenails. Use lotion that does not contain alcohol and is unscented. Do not apply lotion between your toes. Shoes and socks Wear clean socks or stockings every day. Make sure they are not too tight. Do not wear knee-high stockings since they may decrease blood flow to your legs. Wear shoes that fit properly and have enough cushioning. Always look in your shoes before you put them on to be sure there are no objects inside. To break in new shoes, wear them for just a few hours a day. This prevents injuries on your feet. Wounds, scrapes, corns, and calluses  Check your feet daily for blisters, cuts, bruises, sores, and redness. If you cannot see the bottom of your feet, use a mirror or ask someone for help. Do not cut corns or calluses or try to remove them with medicine. If you find a minor scrape,  cut, or break in the skin on your feet, keep it and the skin around it clean and dry. You may clean these areas with mild soap and water. Do not clean the area with peroxide, alcohol, or iodine. If you have a wound, scrape, corn, or callus on your foot, look at it several times a day to make sure it is healing and not infected. Check for: Redness, swelling, or pain. Fluid or blood. Warmth. Pus or a bad smell. General tips Do not cross your legs. This may decrease blood flow to your feet. Do not use heating pads or hot water bottles on your feet. They may burn your skin. If you have lost feeling in your feet or legs, you may not know this is happening until it is too late. Protect your feet from hot and cold by wearing shoes, such as at the beach or on hot pavement. Schedule a complete foot exam at least once a year (annually) or more often if you have foot problems. Report any cuts, sores, or bruises to your health care provider immediately. Where to find more information American Diabetes Association: www.diabetes.org Association of Diabetes Care & Education Specialists: www.diabeteseducator.org Contact a health care provider if: You have a medical condition that increases your risk of infection and you have any cuts, sores, or bruises on your feet. You have an injury that is not healing. You have redness on your legs or feet. You   feel burning or tingling in your legs or feet. You have pain or cramps in your legs and feet. Your legs or feet are numb. Your feet always feel cold. You have pain around any toenails. Get help right away if: You have a wound, scrape, corn, or callus on your foot and: You have pain, swelling, or redness that gets worse. You have fluid or blood coming from the wound, scrape, corn, or callus. Your wound, scrape, corn, or callus feels warm to the touch. You have pus or a bad smell coming from the wound, scrape, corn, or callus. You have a fever. You have a red  line going up your leg. Summary Check your feet every day for blisters, cuts, bruises, sores, and redness. Apply a moisturizing lotion or petroleum jelly to the skin on your feet and to dry, brittle toenails. Wear shoes that fit properly and have enough cushioning. If you have foot problems, report any cuts, sores, or bruises to your health care provider immediately. Schedule a complete foot exam at least once a year (annually) or more often if you have foot problems. This information is not intended to replace advice given to you by your health care provider. Make sure you discuss any questions you have with your health care provider. Document Revised: 05/27/2020 Document Reviewed: 05/27/2020 Elsevier Patient Education  2023 [employer]  

## 2022-05-11 ENCOUNTER — Ambulatory Visit: Payer: BC Managed Care – PPO | Admitting: Nurse Practitioner

## 2022-05-11 ENCOUNTER — Encounter: Payer: Self-pay | Admitting: Nurse Practitioner

## 2022-05-11 VITALS — BP 132/81 | HR 77 | Ht 60.0 in | Wt 222.0 lb

## 2022-05-11 DIAGNOSIS — Z794 Long term (current) use of insulin: Secondary | ICD-10-CM | POA: Diagnosis not present

## 2022-05-11 DIAGNOSIS — I10 Essential (primary) hypertension: Secondary | ICD-10-CM | POA: Diagnosis not present

## 2022-05-11 DIAGNOSIS — E119 Type 2 diabetes mellitus without complications: Secondary | ICD-10-CM

## 2022-05-11 DIAGNOSIS — E782 Mixed hyperlipidemia: Secondary | ICD-10-CM | POA: Diagnosis not present

## 2022-05-11 MED ORDER — TRULICITY 1.5 MG/0.5ML ~~LOC~~ SOAJ: 1.5000 mg | SUBCUTANEOUS | 3 refills | Status: DC

## 2022-05-11 MED ORDER — TRULICITY 4.5 MG/0.5ML ~~LOC~~ SOAJ: 4.5000 mg | SUBCUTANEOUS | 3 refills | Status: DC

## 2022-05-11 MED ORDER — GLIPIZIDE ER 5 MG PO TB24: 5.0000 mg | ORAL_TABLET | Freq: Every day | ORAL | 3 refills | Status: DC

## 2022-05-11 MED ORDER — LANTUS SOLOSTAR 100 UNIT/ML ~~LOC~~ SOPN: 70.0000 [IU] | PEN_INJECTOR | Freq: Every day | SUBCUTANEOUS | 3 refills | Status: DC

## 2022-05-11 MED ORDER — REPATHA SURECLICK 140 MG/ML ~~LOC~~ SOAJ
SUBCUTANEOUS | 3 refills | Status: DC
Start: 2022-05-11 — End: 2022-09-13

## 2022-05-11 NOTE — Progress Notes (Signed)
05/11/2022                     Endocrinology follow-up note   Subjective:    Patient ID: Lisa Burns, female    DOB: 16-Dec-1960, PCP Celene Squibb, MD   Past Medical History:  Diagnosis Date   Acute pharyngitis    Allergy    Asthma    Constipation    Cough    Diabetes mellitus with neuropathy ([place])    Diabetes mellitus without complication (HCC)    GERD (gastroesophageal reflux disease)    Heart murmur    Heart murmur    Hyperlipidemia    Hypertension    Microproteinuria    Neuropathy    Obesity    OSA (obstructive sleep apnea)    Proteinuria    Reflux    Snoring    Ulcer of mouth    URI (upper respiratory infection)    Venous stasis    Past Surgical History:  Procedure Laterality Date   CESAREAN SECTION     COLONOSCOPY N/A 04/24/2016   Procedure: COLONOSCOPY;  Surgeon: Danie Binder, MD;  Location: [facility];  Service: Endoscopy;  Laterality: N/A;  8:30 Am   COLONOSCOPY N/A 07/18/2019   Dr. Oneida Alar: 6 simple adenomas removed.  External/internal hemorrhoids.  Next colonoscopy in 3 years.   LAPAROSCOPIC NISSEN FUNDOPLICATION  2878   OTHER SURGICAL HISTORY     gerd surgery   POLYPECTOMY  07/18/2019   Procedure: POLYPECTOMY;  Surgeon: Danie Binder, MD;  Location: [facility];  Service: Endoscopy;;   THROAT SURGERY  2000   Social History   Socioeconomic History   Marital status: Married    Spouse name: Not on file   Number of children: 1   Years of education: HS   Highest education level: Not on file  Occupational History   Occupation: [employer]   Tobacco Use   Smoking status: Former    Packs/day: 1.00    Years: 5.00    Total pack years: 5.00    Types: Cigarettes   Smokeless tobacco: Never  Vaping Use   Vaping Use: Never used  Substance and Sexual Activity   Alcohol use: Yes    Alcohol/week: 0.0 standard drinks of alcohol    Comment: occasionally   Drug use: No   Sexual activity: Yes    Birth control/protection: None  Other Topics  Concern   Not on file  Social History Narrative   Drinks 2 cans of soda a day    Social Determinants of Health   Financial Resource Strain: Not on file  Food Insecurity: Not on file  Transportation Needs: Not on file  Physical Activity: Not on file  Stress: Not on file  Social Connections: Not on file   Outpatient Encounter Medications as of 05/11/2022  Medication Sig   Dulaglutide (TRULICITY) 4.5 MV/6.7MC SOPN Inject 4.5 mg as directed once a week.   albuterol (PROAIR HFA) 108 (90 Base) MCG/ACT inhaler INHALE TWO PUFFS INTO THE LUNGS EVERY SIX HOURS AS NEEDED   Azelaic Acid 15 % cream Apply 1 application topically daily. Apply to face   clobetasol (TEMOVATE) 0.05 % external solution Apply 1 application topically 2 (two) times daily.   enalapril (VASOTEC) 20 MG tablet TAKE ONE TABLET ('20MG'$  TOTAL) BY MOUTH ATBEDTIME   Evolocumab (REPATHA SURECLICK) 947 MG/ML SOAJ INJECT '140MG'$  INTO THE SKIN EVERY 14 DAYS   fluticasone (FLONASE) 50 MCG/ACT nasal spray Place 1 spray  into both nostrils daily.   fluticasone (FLOVENT HFA) 110 MCG/ACT inhaler INHALE TWO PUFFS INTO THE LUNGS TWICE DAILY   furosemide (LASIX) 40 MG tablet TAKE ONE TABLET ('40MG'$  TOTAL) BY MOUTH EVERY MORNING   gabapentin (NEURONTIN) 400 MG capsule TAKE ONE (1) CAPSULE THREE (3) TIMES EACH DAY   glucose blood test strip Use as instructed   insulin glargine (LANTUS SOLOSTAR) 100 UNIT/ML Solostar Pen Inject 70 Units into the skin at bedtime.   lansoprazole (PREVACID) 15 MG capsule Take 1 capsule (15 mg total) by mouth daily.   loratadine (CLARITIN) 10 MG tablet Take 10 mg by mouth daily.    metoprolol tartrate (LOPRESSOR) 100 MG tablet Take 1 tablet (100 mg total) by mouth once for 1 dose. Take 90-120 minutes prior to scan.   nortriptyline (PAMELOR) 50 MG capsule TAKE TWO CAPSULES BY MOUTH AT BEDTIME   valACYclovir (VALTREX) 500 MG tablet Take 500 mg by mouth 2 (two) times daily as needed (cold sore flare ups).    [DISCONTINUED]  Dulaglutide (TRULICITY) 1.5 PJ/8.2NK SOPN Inject 1.5 mg into the skin once a week.   [DISCONTINUED] Dulaglutide (TRULICITY) 1.5 NL/9.7QB SOPN Inject 1.5 mg into the skin once a week.   [DISCONTINUED] glipiZIDE (GLUCOTROL XL) 5 MG 24 hr tablet Take 1 tablet (5 mg total) by mouth daily with breakfast.   [DISCONTINUED] glipiZIDE (GLUCOTROL XL) 5 MG 24 hr tablet Take 1 tablet (5 mg total) by mouth daily with breakfast.   [DISCONTINUED] insulin glargine (LANTUS SOLOSTAR) 100 UNIT/ML Solostar Pen Inject 70 Units into the skin at bedtime.   [DISCONTINUED] REPATHA SURECLICK 341 MG/ML SOAJ INJECT '140MG'$  INTO THE SKIN EVERY 14 DAYS   No facility-administered encounter medications on file as of 05/11/2022.   ALLERGIES: Allergies  Allergen Reactions   Augmentin [Amoxicillin-Pot Clavulanate]     GI upset Did it involve swelling of the face/tongue/throat, SOB, or low BP? No Did it involve sudden or severe rash/hives, skin peeling, or any reaction on the inside of your mouth or nose? No Did you need to seek medical attention at a hospital or doctor's office? No When did it last happen?      15 years If all above answers are "NO", may proceed with cephalosporin use.    Cinnamon Hives and Swelling   Coconut Flavor Hives and Swelling   Januvia [Sitagliptin]     GI upset   Metformin Diarrhea   Metformin And Related     GI upset   Pravastatin     Pain in joints   Strawberry (Diagnostic) Hives and Swelling   VACCINATION STATUS: Immunization History  Administered Date(s) Administered   Influenza, Seasonal, Injecte, Preservative Fre 08/09/2016   Influenza,inj,Quad PF,6+ Mos 12/27/2017   PFIZER(Purple Top)SARS-COV-2 Vaccination 03/11/2020, 04/11/2020   Pneumococcal Polysaccharide-23 12/30/2013    Diabetes She presents for her follow-up diabetic visit. She has type 2 diabetes mellitus. Onset time: She was diagnosed at approximate age of 28 years. Her disease course has been stable. There are no  hypoglycemic associated symptoms. Pertinent negatives for hypoglycemia include no confusion, headaches, pallor or seizures. There are no diabetic associated symptoms. Pertinent negatives for diabetes include no chest pain, no polydipsia, no polyphagia, no polyuria and no weight loss. There are no hypoglycemic complications. Symptoms are stable. There are no diabetic complications. Risk factors for coronary artery disease include diabetes mellitus, dyslipidemia, family history, hypertension, obesity, sedentary lifestyle and tobacco exposure. Current diabetic treatment includes insulin injections and oral agent (monotherapy) (And Trulicity). She is compliant  with treatment most of the time. Her weight is fluctuating minimally. She is following a generally healthy diet. When asked about meal planning, she reported none. She has not had a previous visit with a dietitian. She never participates in exercise. Her home blood glucose trend is decreasing steadily. Her breakfast blood glucose range is generally 110-130 mg/dl. (She presents today with her meter and logs showing improved glycemic profile overall. Her previsit A1c was 6.8% on 5/11, improving from last visit of 8.5%.  She denies any significant hypoglycemia.  She would like to try to lose weight.  She notes she did get steroid injection in left knee between visits.) An ACE inhibitor/angiotensin II receptor blocker is being taken. She sees a podiatrist.Eye exam is current.  Hyperlipidemia This is a chronic problem. The current episode started more than 1 year ago. The problem is controlled. Recent lipid tests were reviewed and are normal. Exacerbating diseases include chronic renal disease, diabetes and obesity. Factors aggravating her hyperlipidemia include fatty foods. Pertinent negatives include no chest pain, myalgias or shortness of breath. Treatments tried: she does not tolerate statins; is on Repatha. The current treatment provides no improvement of  lipids. Compliance problems include medication side effects, adherence to diet and adherence to exercise.  Risk factors for coronary artery disease include diabetes mellitus, dyslipidemia, hypertension, obesity and a sedentary lifestyle.  Hypertension This is a chronic problem. The current episode started more than 1 year ago. The problem has been gradually improving since onset. The problem is controlled. Associated symptoms include peripheral edema. Pertinent negatives include no chest pain, headaches, palpitations or shortness of breath. There are no associated agents to hypertension. Risk factors for coronary artery disease include dyslipidemia, diabetes mellitus, obesity, sedentary lifestyle and post-menopausal state. Past treatments include ACE inhibitors and diuretics. The current treatment provides mild improvement. Compliance problems include diet and exercise.  Hypertensive end-organ damage includes kidney disease. Identifiable causes of hypertension include chronic renal disease.    Review of systems  Constitutional: + Minimally fluctuating body weight,  current Body mass index is 43.36 kg/m. , no fatigue, no subjective hyperthermia, no subjective hypothermia Eyes: no blurry vision, no xerophthalmia ENT: no sore throat, no nodules palpated in throat, no dysphagia/odynophagia, no hoarseness Cardiovascular: no chest pain, no shortness of breath, no palpitations, no leg swelling, undergoing cardiology workup for murmur Respiratory: no cough, no shortness of breath Gastrointestinal: no nausea/vomiting/diarrhea Musculoskeletal: no muscle/joint aches Skin: no rashes, no hyperemia Neurological: no tremors, no numbness, no tingling, no dizziness Psychiatric: no depression, no anxiety    Objective:    BP 132/81   Pulse 77   Ht 5' (1.524 m)   Wt 222 lb (100.7 kg)   BMI 43.36 kg/m   Wt Readings from Last 3 Encounters:  05/11/22 222 lb (100.7 kg)  04/26/22 223 lb (101.2 kg)  01/11/22  227 lb 12.8 oz (103.3 kg)    BP Readings from Last 3 Encounters:  05/11/22 132/81  04/26/22 140/80  01/11/22 125/71    Physical Exam- Limited  Constitutional:  Body mass index is 43.36 kg/m. , not in acute distress, normal state of mind Eyes:  EOMI, no exophthalmos Neck: Supple Respiratory: Adequate breathing efforts Musculoskeletal: no gross deformities, strength intact in all four extremities, no gross restriction of joint movements Skin:  no rashes, no hyperemia Neurological: no tremor with outstretched hands  Diabetic Foot Exam - Simple   Simple Foot Form Visual Inspection No deformities, no ulcerations, no other skin breakdown bilaterally: Yes Sensation Testing  See comments: Yes Pulse Check Posterior Tibialis and Dorsalis pulse intact bilaterally: Yes Comments Decreased sensation to monofilament tool R>L     Results for orders placed or performed in visit on 04/04/22  Microalbumin, urine  Result Value Ref Range   Microalb, Ur 70.9   Basic metabolic panel  Result Value Ref Range   Glucose 92    BUN 13 4 - 21   CO2 27 (A) 13 - 22   Creatinine 0.8 0.5 - 1.1   Potassium 3.8 3.5 - 5.1 mEq/L   Sodium 142 137 - 147   Chloride 103 99 - 108  Comprehensive metabolic panel  Result Value Ref Range   Globulin 1.9    Calcium 9.2 8.7 - 10.7   Albumin 4.1 3.5 - 5.0  Lipid panel  Result Value Ref Range   Triglycerides 122 40 - 160   Cholesterol 93 0 - 200   HDL 50 35 - 70   LDL Cholesterol 21   Hepatic function panel  Result Value Ref Range   Alkaline Phosphatase 119 25 - 125   ALT 21 7 - 35 U/L   AST 22 13 - 35   Bilirubin, Total 0.2   Hemoglobin A1c  Result Value Ref Range   Hemoglobin A1C 6.8    Diabetic Labs (most recent): Lab Results  Component Value Date   HGBA1C 6.8 03/30/2022   HGBA1C 8.5 (A) 01/11/2022   HGBA1C 5.6 07/11/2021   MICROALBUR 10.0 03/30/2022   MICROALBUR 10 07/11/2021   MICROALBUR 10 09/06/2020   Lipid Panel     Component Value  Date/Time   CHOL 93 03/30/2022 0000   TRIG 122 03/30/2022 0000   HDL 50 03/30/2022 0000   CHOLHDL 1.9 07/07/2021 0720   VLDL 43 (H) 11/29/2016 0729   LDLCALC 21 03/30/2022 0000   LDLCALC 34 07/07/2021 0720     Assessment & Plan:   1) Uncontrolled type 2 diabetes mellitus with complication.  She presents today with her meter and logs showing improved glycemic profile overall. Her previsit A1c was 6.8% on 5/11, improving from last visit of 8.5%.  She denies any significant hypoglycemia.  She would like to try to lose weight.  She notes she did get steroid injection in left knee between visits.  - She  remains at a high risk for more acute and chronic complications of diabetes which include CAD, CVA, CKD, retinopathy, and neuropathy. These are all discussed in detail with the patient.    Recent labs reviewed.  - Nutritional counseling repeated at each appointment due to patients tendency to fall back in to old habits.  - The patient admits there is a room for improvement in their diet and drink choices. -  Suggestion is made for the patient to avoid simple carbohydrates from their diet including Cakes, Sweet Desserts / Pastries, Ice Cream, Soda (diet and regular), Sweet Tea, Candies, Chips, Cookies, Sweet Pastries, Store Bought Juices, Alcohol in Excess of 1-2 drinks a day, Artificial Sweeteners, Coffee Creamer, and "Sugar-free" Products. This will help patient to have stable blood glucose profile and potentially avoid unintended weight gain.   - I encouraged the patient to switch to unprocessed or minimally processed complex starch and increased protein intake (animal or plant source), fruits, and vegetables.   - Patient is advised to stick to a routine mealtimes to eat 3 meals a day and avoid unnecessary snacks (to snack only to correct hypoglycemia).  - I have approached patient with the following individualized  plan to manage diabetes and patient agrees.  - Will increase Trulicity to  3 mg SQ weekly (can double up on 1.5 mg injections for now until she depletes her current supply) and then increase to 4.5 mg SQ weekly thereafter.  She can continue her Lantus 70 units SQ nightly.  She is advised to stop her Glipizide for now.  -She is encouraged to continue monitoring blood glucose at least twice daily, before breakfast and before bed, and call the clinic if she has readings less than 70 or greater than 200 for 3 tests in a row.  - Patient specific target  for A1c; LDL, HDL, Triglycerides, and  Waist Circumference were discussed in detail.  2) BP/HTN: Her blood pressure is controlled to target.  She is advised to continue Enalapril 20 mg po daily and Lasix 40 mg po daily.    3) Lipids/HPL:  Her recent lipid panel from 03/30/22 shows controlled LDL of 21.  She does not tolerate statins.  She is advised to continue Repatha 140 mg SQ every 2 weeks.  She has seen cardiology for heart murmur recently and is currently undergoing tests.  4)  Weight/Diet:  Her Body mass index is 43.36 kg/m.--  She is a candidate for modest weight loss.  Exercise, and carbohydrates information provided.  5) Chronic Care/Health Maintenance: -Patient is on ACEI/ARB medications and encouraged to continue to follow up with Ophthalmology, Podiatrist at least yearly or according to recommendations, and advised to  stay away from smoking. I have recommended yearly flu vaccine and pneumonia vaccination at least every 5 years; moderate intensity exercise for up to 150 minutes weekly; and  sleep for at least 7 hours a day.  - I advised patient to maintain close follow up with Celene Squibb, MD for primary care needs.      I spent 30 minutes in the care of the patient today including review of labs from [place], Lipids, Thyroid Function, Hematology (current and previous including abstractions from other facilities); face-to-face time discussing  her blood glucose readings/logs, discussing hypoglycemia and  hyperglycemia episodes and symptoms, medications doses, her options of short and long term treatment based on the latest standards of care / guidelines;  discussion about incorporating lifestyle medicine;  and documenting the encounter.    Please refer to Patient Instructions for Blood Glucose Monitoring and Insulin/Medications Dosing Guide"  in media tab for additional information. Please  also refer to " Patient Self Inventory" in the Media  tab for reviewed elements of pertinent patient history.  Mearl Latin Fischel participated in the discussions, expressed understanding, and voiced agreement with the above plans.  All questions were answered to her satisfaction. she is encouraged to contact clinic should she have any questions or concerns prior to her return visit.    Follow up plan: -Return in about 4 months (around 09/10/2022) for Diabetes F/U with A1c in office, No previsit labs, Bring meter and logs.    Rayetta Pigg, [facility] [address] [place], [place] [address] Phone: [phone] Fax: [phone]  05/11/2022, 4:00 PM

## 2022-05-16 ENCOUNTER — Telehealth (HOSPITAL_COMMUNITY): Payer: Self-pay | Admitting: Emergency Medicine

## 2022-05-17 ENCOUNTER — Ambulatory Visit (HOSPITAL_COMMUNITY)
Admission: RE | Admit: 2022-05-17 | Discharge: 2022-05-17 | Disposition: A | Discharge status: Home / Self Care | Payer: BC Managed Care – PPO | Source: Ambulatory Visit | Attending: Cardiology | Admitting: Cardiology

## 2022-05-17 DIAGNOSIS — R931 Abnormal findings on diagnostic imaging of heart and coronary circulation: Secondary | ICD-10-CM | POA: Insufficient documentation

## 2022-05-17 DIAGNOSIS — R079 Chest pain, unspecified: Secondary | ICD-10-CM | POA: Insufficient documentation

## 2022-05-17 DIAGNOSIS — I2510 Atherosclerotic heart disease of native coronary artery without angina pectoris: Secondary | ICD-10-CM | POA: Diagnosis not present

## 2022-05-17 MED ORDER — NITROGLYCERIN 0.4 MG SL SUBL
0.8000 mg | SUBLINGUAL_TABLET | Freq: Once | SUBLINGUAL | Status: AC
  Administered 2022-05-17: 0.8 mg via SUBLINGUAL

## 2022-05-17 MED ORDER — NITROGLYCERIN 0.4 MG SL SUBL
SUBLINGUAL_TABLET | SUBLINGUAL | Status: AC
  Filled 2022-05-17: qty 2

## 2022-05-17 MED ORDER — IOHEXOL 350 MG/ML SOLN
100.0000 mL | Freq: Once | INTRAVENOUS | Status: AC | PRN
  Administered 2022-05-17: 100 mL via INTRAVENOUS

## 2022-05-18 ENCOUNTER — Other Ambulatory Visit (HOSPITAL_COMMUNITY): Payer: BC Managed Care – PPO

## 2022-05-18 ENCOUNTER — Other Ambulatory Visit: Payer: Self-pay | Admitting: Internal Medicine

## 2022-05-18 ENCOUNTER — Ambulatory Visit (HOSPITAL_COMMUNITY): Payer: BC Managed Care – PPO | Attending: Cardiovascular Disease

## 2022-05-18 ENCOUNTER — Ambulatory Visit (HOSPITAL_COMMUNITY)
Admission: RE | Admit: 2022-05-18 | Discharge: 2022-05-18 | Disposition: A | Discharge status: Home / Self Care | Payer: BC Managed Care – PPO | Source: Ambulatory Visit | Attending: Internal Medicine | Admitting: Internal Medicine

## 2022-05-18 ENCOUNTER — Ambulatory Visit (HOSPITAL_BASED_OUTPATIENT_CLINIC_OR_DEPARTMENT_OTHER)
Admission: RE | Admit: 2022-05-18 | Discharge: 2022-05-18 | Disposition: A | Discharge status: Home / Self Care | Payer: BC Managed Care – PPO | Source: Ambulatory Visit | Attending: Internal Medicine | Admitting: Internal Medicine

## 2022-05-18 DIAGNOSIS — R079 Chest pain, unspecified: Secondary | ICD-10-CM | POA: Diagnosis not present

## 2022-05-18 DIAGNOSIS — R931 Abnormal findings on diagnostic imaging of heart and coronary circulation: Secondary | ICD-10-CM | POA: Insufficient documentation

## 2022-05-18 DIAGNOSIS — R011 Cardiac murmur, unspecified: Secondary | ICD-10-CM | POA: Diagnosis not present

## 2022-05-18 DIAGNOSIS — I2510 Atherosclerotic heart disease of native coronary artery without angina pectoris: Secondary | ICD-10-CM | POA: Diagnosis not present

## 2022-05-18 LAB — ECHOCARDIOGRAM COMPLETE
Area-P 1/2: 3.3 cm2
S' Lateral: 2.5 cm

## 2022-05-19 ENCOUNTER — Telehealth: Payer: Self-pay

## 2022-05-19 MED ORDER — ISOSORBIDE MONONITRATE ER 30 MG PO TB24: 30.0000 mg | ORAL_TABLET | Freq: Every day | ORAL | 3 refills | Status: DC

## 2022-05-19 MED ORDER — METOPROLOL SUCCINATE ER 25 MG PO TB24: 25.0000 mg | ORAL_TABLET | Freq: Every day | ORAL | 3 refills | Status: DC

## 2022-05-19 NOTE — Telephone Encounter (Signed)
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  This may be causing some of the symptoms of shortness of breath as well as chest pressure arm pain that prompted an ER visit previously.  The remainder of the main vessels show only mild to moderate disease but not severe.  It would be reasonable to start Toprol-XL 25 mg once a day, a beta-blocker as an antianginal.  We could also try isosorbide 30 mg once a day as well. Let's start these.   Excellent use of Repatha and very low LDL to offer plaque stabilization.  If symptoms continue or progress with these medications, we can always proceed with heart catheterization and potential stenting of this side branch vessel.  Lets follow-up with her in 2 to 4 weeks to see how she is feeling.  Thankfully, there is no indication for bypass surgery based upon the CT scan.  She does have a strong family history of this.  Candee Furbish, MD

## 2022-05-19 NOTE — Telephone Encounter (Signed)
The patient has been notified of the result and verbalized understanding.  All questions (if any) were answered. Antonieta Iba, RN 05/19/2022 2:30 PM  Prescriptions have been sent in for Toprol and Imdur. Follow up appointment has been made.

## 2022-05-24 DIAGNOSIS — M9905 Segmental and somatic dysfunction of pelvic region: Secondary | ICD-10-CM | POA: Diagnosis not present

## 2022-05-24 DIAGNOSIS — M9903 Segmental and somatic dysfunction of lumbar region: Secondary | ICD-10-CM | POA: Diagnosis not present

## 2022-05-24 DIAGNOSIS — M9902 Segmental and somatic dysfunction of thoracic region: Secondary | ICD-10-CM | POA: Diagnosis not present

## 2022-05-24 DIAGNOSIS — M5442 Lumbago with sciatica, left side: Secondary | ICD-10-CM | POA: Diagnosis not present

## 2022-05-25 DIAGNOSIS — G4733 Obstructive sleep apnea (adult) (pediatric): Secondary | ICD-10-CM | POA: Diagnosis not present

## 2022-05-29 DIAGNOSIS — M9903 Segmental and somatic dysfunction of lumbar region: Secondary | ICD-10-CM | POA: Diagnosis not present

## 2022-05-29 DIAGNOSIS — M9905 Segmental and somatic dysfunction of pelvic region: Secondary | ICD-10-CM | POA: Diagnosis not present

## 2022-05-29 DIAGNOSIS — M5442 Lumbago with sciatica, left side: Secondary | ICD-10-CM | POA: Diagnosis not present

## 2022-05-29 DIAGNOSIS — M9902 Segmental and somatic dysfunction of thoracic region: Secondary | ICD-10-CM | POA: Diagnosis not present

## 2022-06-01 ENCOUNTER — Encounter: Payer: Self-pay | Admitting: *Deleted

## 2022-06-05 DIAGNOSIS — M9902 Segmental and somatic dysfunction of thoracic region: Secondary | ICD-10-CM | POA: Diagnosis not present

## 2022-06-05 DIAGNOSIS — M9903 Segmental and somatic dysfunction of lumbar region: Secondary | ICD-10-CM | POA: Diagnosis not present

## 2022-06-05 DIAGNOSIS — M9905 Segmental and somatic dysfunction of pelvic region: Secondary | ICD-10-CM | POA: Diagnosis not present

## 2022-06-05 DIAGNOSIS — M5442 Lumbago with sciatica, left side: Secondary | ICD-10-CM | POA: Diagnosis not present

## 2022-06-09 DIAGNOSIS — M9903 Segmental and somatic dysfunction of lumbar region: Secondary | ICD-10-CM | POA: Diagnosis not present

## 2022-06-09 DIAGNOSIS — M9902 Segmental and somatic dysfunction of thoracic region: Secondary | ICD-10-CM | POA: Diagnosis not present

## 2022-06-09 DIAGNOSIS — M5442 Lumbago with sciatica, left side: Secondary | ICD-10-CM | POA: Diagnosis not present

## 2022-06-09 DIAGNOSIS — M9905 Segmental and somatic dysfunction of pelvic region: Secondary | ICD-10-CM | POA: Diagnosis not present

## 2022-06-13 ENCOUNTER — Ambulatory Visit: Payer: BC Managed Care – PPO | Admitting: Cardiology

## 2022-06-13 ENCOUNTER — Encounter: Payer: Self-pay | Admitting: Cardiology

## 2022-06-13 VITALS — BP 130/70 | HR 67 | Ht 60.0 in | Wt 222.0 lb

## 2022-06-13 DIAGNOSIS — E7800 Pure hypercholesterolemia, unspecified: Secondary | ICD-10-CM | POA: Diagnosis not present

## 2022-06-13 DIAGNOSIS — Z8249 Family history of ischemic heart disease and other diseases of the circulatory system: Secondary | ICD-10-CM

## 2022-06-13 DIAGNOSIS — I2510 Atherosclerotic heart disease of native coronary artery without angina pectoris: Secondary | ICD-10-CM | POA: Diagnosis not present

## 2022-06-13 MED ORDER — ASPIRIN 81 MG PO TBEC: 81.0000 mg | DELAYED_RELEASE_TABLET | Freq: Every day | ORAL | 3 refills | Status: AC

## 2022-06-13 NOTE — Progress Notes (Signed)
Cardiology Office Note:    Date:  06/13/2022   ID:  Lisa Burns, DOB 05-Nov-1961, MRN [id number]  PCP:  Celene Squibb, MD   [facility] Providers Cardiologist:  Candee Furbish, MD     Referring MD: Celene Squibb, MD    History of Present Illness:    Lisa Burns is a 61 y.o. female here for follow-up of coronary CT scan performed on 05/17/2022 which had a coronary calcium score of 13, 68th percentile but showed moderate mixed plaque in the proximal RCA 50 to 69% stenosis as well as possible severe stenosis in the mid first diagonal branch of 70 to 99%.  The proximal reference vessel was 2.5 mm of the diagonal.  She also had mild plaque in the proximal left circumflex with 25 to 49%.  Mild aortic valve calcifications 53 score.  Previously I had seen her on 04/26/2022 because of heart murmur and strong family history of cardiac disease.  Former smoker 1 pack a day for 20 years.  Her father had a heart attack in his late 40s Brother and sister both had CABG when they were in their 74s and the sister died at 67.  Sometimes she would feel some shortness of breath and occasional chest discomfort.  Went to [employer] with pressure, arm discomfort a few years ago.  She has been on Repatha and her LDL has been 21.  After the cardiac CT I went ahead and started her on Toprol-XL 25 mg a day as well as isosorbide 30 mg a day.  We have also given her 81 mg of aspirin now.  She states that she is feeling well without any major anginal symptoms.  She still feels some shortness of breath with activity.   Past Medical History:  Diagnosis Date   Acute pharyngitis    Allergy    Asthma    Constipation    Cough    Diabetes mellitus with neuropathy (HCC)    Diabetes mellitus without complication (HCC)    GERD (gastroesophageal reflux disease)    Heart murmur    Heart murmur    Hyperlipidemia    Hypertension    Microproteinuria    Neuropathy    Obesity    OSA (obstructive sleep apnea)    Proteinuria     Reflux    Snoring    Ulcer of mouth    URI (upper respiratory infection)    Venous stasis     Past Surgical History:  Procedure Laterality Date   CESAREAN SECTION     COLONOSCOPY N/A 04/24/2016   Procedure: COLONOSCOPY;  Surgeon: Danie Binder, MD;  Location: [facility];  Service: Endoscopy;  Laterality: N/A;  8:30 Am   COLONOSCOPY N/A 07/18/2019   Dr. Oneida Alar: 6 simple adenomas removed.  External/internal hemorrhoids.  Next colonoscopy in 3 years.   LAPAROSCOPIC NISSEN FUNDOPLICATION  2542   OTHER SURGICAL HISTORY     gerd surgery   POLYPECTOMY  07/18/2019   Procedure: POLYPECTOMY;  Surgeon: Danie Binder, MD;  Location: [facility];  Service: Endoscopy;;   THROAT SURGERY  2000    Current Medications: Current Meds  Medication Sig   albuterol (PROAIR HFA) 108 (90 Base) MCG/ACT inhaler INHALE TWO PUFFS INTO THE LUNGS EVERY SIX HOURS AS NEEDED   aspirin EC 81 MG tablet Take 1 tablet (81 mg total) by mouth daily. Swallow whole.   Azelaic Acid 15 % cream Apply 1 application topically daily. Apply to face  clobetasol (TEMOVATE) 0.05 % external solution Apply 1 application topically 2 (two) times daily.   Dulaglutide (TRULICITY) 4.5 LP/3.7TK SOPN Inject 4.5 mg as directed once a week.   enalapril (VASOTEC) 20 MG tablet TAKE ONE TABLET ('20MG'$  TOTAL) BY MOUTH ATBEDTIME   Evolocumab (REPATHA SURECLICK) 240 MG/ML SOAJ INJECT '140MG'$  INTO THE SKIN EVERY 14 DAYS   fluticasone (FLONASE) 50 MCG/ACT nasal spray Place 1 spray into both nostrils daily.   fluticasone (FLOVENT HFA) 110 MCG/ACT inhaler INHALE TWO PUFFS INTO THE LUNGS TWICE DAILY   furosemide (LASIX) 40 MG tablet TAKE ONE TABLET ('40MG'$  TOTAL) BY MOUTH EVERY MORNING   gabapentin (NEURONTIN) 300 MG capsule Take 300 mg by mouth daily.   gabapentin (NEURONTIN) 400 MG capsule TAKE ONE (1) CAPSULE THREE (3) TIMES EACH DAY   glucose blood test strip Use as instructed   insulin glargine (LANTUS SOLOSTAR) 100 UNIT/ML Solostar Pen Inject  70 Units into the skin at bedtime.   isosorbide mononitrate (IMDUR) 30 MG 24 hr tablet Take 1 tablet (30 mg total) by mouth daily.   lansoprazole (PREVACID) 15 MG capsule Take 1 capsule (15 mg total) by mouth daily.   loratadine (CLARITIN) 10 MG tablet Take 10 mg by mouth daily.    metoprolol succinate (TOPROL XL) 25 MG 24 hr tablet Take 1 tablet (25 mg total) by mouth daily.   nortriptyline (PAMELOR) 50 MG capsule TAKE TWO CAPSULES BY MOUTH AT BEDTIME   valACYclovir (VALTREX) 500 MG tablet Take 500 mg by mouth 2 (two) times daily as needed (cold sore flare ups).      Allergies:   Augmentin [amoxicillin-pot clavulanate], Cinnamon, Coconut flavor, Januvia [sitagliptin], Metformin, Metformin and related, Pravastatin, and Strawberry (diagnostic)   Social History   Socioeconomic History   Marital status: Married    Spouse name: Not on file   Number of children: 1   Years of education: HS   Highest education level: Not on file  Occupational History   Occupation: [employer]   Tobacco Use   Smoking status: Former    Packs/day: 1.00    Years: 5.00    Total pack years: 5.00    Types: Cigarettes   Smokeless tobacco: Never  Vaping Use   Vaping Use: Never used  Substance and Sexual Activity   Alcohol use: Yes    Alcohol/week: 0.0 standard drinks of alcohol    Comment: occasionally   Drug use: No   Sexual activity: Yes    Birth control/protection: None  Other Topics Concern   Not on file  Social History Narrative   Drinks 2 cans of soda a day    Social Determinants of Health   Financial Resource Strain: Not on file  Food Insecurity: Not on file  Transportation Needs: Not on file  Physical Activity: Not on file  Stress: Not on file  Social Connections: Not on file     Family History: The patient's family history includes Cancer in her father; Colon cancer in her father and paternal grandmother; Diabetes in her brother, mother, and sister; Heart disease in her brother,  father, and sister.  ROS:   Please see the history of present illness.    No syncope no bleeding all other systems reviewed and are negative.  EKGs/Labs/Other Studies Reviewed:    The following studies were reviewed today: Echocardiogram 05/18/2022  1. Left ventricular ejection fraction, by estimation, is 60 to 65%. The  left ventricle has normal function. The left ventricle has no regional  wall motion  abnormalities. Left ventricular diastolic parameters were  normal.   2. Right ventricular systolic function is normal. The right ventricular  size is normal.   3. The mitral valve is normal in structure. Trivial mitral valve  regurgitation.   4. The aortic valve is normal in structure. Aortic valve regurgitation is  not visualized.    Recent Labs: 07/07/2021: Hemoglobin 12.2; Platelets 238 03/30/2022: ALT 21; BUN 13; Creatinine 0.8; Potassium 3.8; Sodium 142  Recent Lipid Panel    Component Value Date/Time   CHOL 93 03/30/2022 0000   TRIG 122 03/30/2022 0000   HDL 50 03/30/2022 0000   CHOLHDL 1.9 07/07/2021 0720   VLDL 43 (H) 11/29/2016 0729   LDLCALC 21 03/30/2022 0000   LDLCALC 34 07/07/2021 0720     Risk Assessment/Calculations:              Physical Exam:    VS:  BP 130/70 (BP Location: Left Arm, Patient Position: Sitting, Cuff Size: Normal)   Pulse 67   Ht 5' (1.524 m)   Wt 222 lb (100.7 kg)   SpO2 94%   BMI 43.36 kg/m     Wt Readings from Last 3 Encounters:  06/13/22 222 lb (100.7 kg)  05/11/22 222 lb (100.7 kg)  04/26/22 223 lb (101.2 kg)     GEN:  Well nourished, well developed in no acute distress HEENT: Normal NECK: No JVD; No carotid bruits LYMPHATICS: No lymphadenopathy CARDIAC: RRR, no murmurs, no rubs, gallops RESPIRATORY:  Clear to auscultation without rales, wheezing or rhonchi  ABDOMEN: Soft, non-tender, non-distended MUSCULOSKELETAL:  No edema; No deformity  SKIN: Warm and dry NEUROLOGIC:  Alert and oriented x 3 PSYCHIATRIC:  Normal  affect   ASSESSMENT:    1. Coronary artery disease involving native coronary artery of native heart without angina pectoris   2. Pure hypercholesterolemia   3. Family history of early CAD   38. Morbid obesity (HCC)    PLAN:    In order of problems listed above:  Coronary artery disease - Coronary CT revealed severe mid vessel diagonal stenosis with reference diameter 2.5 mm as well as moderate RCA and circumflex disease.  We started her on Toprol-XL 25, isosorbide 30, aspirin 81 in addition to her excellent Repatha.  She is not having any major symptoms currently.  If symptoms become more worrisome, we can always proceed with cardiac catheterization.  I did describe to her that her 2.5 mm diagonal may not be amenable to PCI.  Thankfully, she does not require bypass surgery as some of her family members.  Hyperlipidemia - Currently on PCSK9 inhibitor Repatha with excellent LDL of 21.  Protective.  Family history of CAD - As described above several early family members CAD, MI.  CABG  Morbid obesity - Continue to encourage weight loss.  BMI 43      Medication Adjustments/Labs and Tests Ordered: Current medicines are reviewed at length with the patient today.  Concerns regarding medicines are outlined above.  No orders of the defined types were placed in this encounter.  Meds ordered this encounter  Medications   aspirin EC 81 MG tablet    Sig: Take 1 tablet (81 mg total) by mouth daily. Swallow whole.    Dispense:  90 tablet    Refill:  3    Patient Instructions  Medication Instructions:  Your physician has recommended you make the following change in your medication:  1) START taking aspirin 81 mg daily *If you need a refill on  your cardiac medications before your next appointment, please call your pharmacy*  Follow-Up: At [facility], you and your health needs are our priority.  As part of our continuing mission to provide you with exceptional heart care, we have  created designated Provider Care Teams.  These Care Teams include your primary Cardiologist (physician) and Advanced Practice Providers (APPs -  Physician Assistants and Nurse Practitioners) who all work together to provide you with the care you need, when you need it.  Your next appointment:   6 month(s)  The format for your next appointment:   In Person  Provider:   Robbie Lis, PA-C or Ambrose Pancoast, NP    Important Information About Sugar         Signed, Candee Furbish, MD  06/13/2022 12:15 PM    [place]

## 2022-06-13 NOTE — Patient Instructions (Signed)
Medication Instructions:  Your physician has recommended you make the following change in your medication:  1) START taking aspirin 81 mg daily *If you need a refill on your cardiac medications before your next appointment, please call your pharmacy*  Follow-Up: At [facility], you and your health needs are our priority.  As part of our continuing mission to provide you with exceptional heart care, we have created designated Provider Care Teams.  These Care Teams include your primary Cardiologist (physician) and Advanced Practice Providers (APPs -  Physician Assistants and Nurse Practitioners) who all work together to provide you with the care you need, when you need it.  Your next appointment:   6 month(s)  The format for your next appointment:   In Person  Provider:   Robbie Lis, PA-C or Ambrose Pancoast, NP    Important Information About Sugar

## 2022-06-14 DIAGNOSIS — M9905 Segmental and somatic dysfunction of pelvic region: Secondary | ICD-10-CM | POA: Diagnosis not present

## 2022-06-14 DIAGNOSIS — M9902 Segmental and somatic dysfunction of thoracic region: Secondary | ICD-10-CM | POA: Diagnosis not present

## 2022-06-14 DIAGNOSIS — M5442 Lumbago with sciatica, left side: Secondary | ICD-10-CM | POA: Diagnosis not present

## 2022-06-14 DIAGNOSIS — M9903 Segmental and somatic dysfunction of lumbar region: Secondary | ICD-10-CM | POA: Diagnosis not present

## 2022-06-27 DIAGNOSIS — Z01419 Encounter for gynecological examination (general) (routine) without abnormal findings: Secondary | ICD-10-CM | POA: Diagnosis not present

## 2022-06-27 DIAGNOSIS — Z1231 Encounter for screening mammogram for malignant neoplasm of breast: Secondary | ICD-10-CM | POA: Diagnosis not present

## 2022-06-27 DIAGNOSIS — Z6841 Body Mass Index (BMI) 40.0 and over, adult: Secondary | ICD-10-CM | POA: Diagnosis not present

## 2022-06-27 DIAGNOSIS — Z124 Encounter for screening for malignant neoplasm of cervix: Secondary | ICD-10-CM | POA: Diagnosis not present

## 2022-06-29 ENCOUNTER — Other Ambulatory Visit: Payer: Self-pay | Admitting: Obstetrics and Gynecology

## 2022-06-29 DIAGNOSIS — R928 Other abnormal and inconclusive findings on diagnostic imaging of breast: Secondary | ICD-10-CM

## 2022-07-07 ENCOUNTER — Ambulatory Visit
Admission: RE | Admit: 2022-07-07 | Discharge: 2022-07-07 | Disposition: A | Discharge status: Home / Self Care | Payer: BC Managed Care – PPO | Source: Ambulatory Visit | Attending: Obstetrics and Gynecology | Admitting: Obstetrics and Gynecology

## 2022-07-07 ENCOUNTER — Other Ambulatory Visit: Payer: Self-pay | Admitting: Obstetrics and Gynecology

## 2022-07-07 DIAGNOSIS — R928 Other abnormal and inconclusive findings on diagnostic imaging of breast: Secondary | ICD-10-CM

## 2022-07-07 DIAGNOSIS — N6321 Unspecified lump in the left breast, upper outer quadrant: Secondary | ICD-10-CM | POA: Diagnosis not present

## 2022-07-07 DIAGNOSIS — N6320 Unspecified lump in the left breast, unspecified quadrant: Secondary | ICD-10-CM

## 2022-07-10 DIAGNOSIS — G4733 Obstructive sleep apnea (adult) (pediatric): Secondary | ICD-10-CM | POA: Diagnosis not present

## 2022-08-04 DIAGNOSIS — E119 Type 2 diabetes mellitus without complications: Secondary | ICD-10-CM | POA: Diagnosis not present

## 2022-08-04 DIAGNOSIS — E785 Hyperlipidemia, unspecified: Secondary | ICD-10-CM | POA: Diagnosis not present

## 2022-08-04 DIAGNOSIS — I10 Essential (primary) hypertension: Secondary | ICD-10-CM | POA: Diagnosis not present

## 2022-08-09 DIAGNOSIS — G4733 Obstructive sleep apnea (adult) (pediatric): Secondary | ICD-10-CM | POA: Diagnosis not present

## 2022-08-16 ENCOUNTER — Encounter: Payer: Self-pay | Admitting: *Deleted

## 2022-09-06 ENCOUNTER — Other Ambulatory Visit: Payer: Self-pay | Admitting: Sports Medicine

## 2022-09-06 DIAGNOSIS — M5416 Radiculopathy, lumbar region: Secondary | ICD-10-CM

## 2022-09-06 DIAGNOSIS — M47816 Spondylosis without myelopathy or radiculopathy, lumbar region: Secondary | ICD-10-CM

## 2022-09-06 DIAGNOSIS — M4726 Other spondylosis with radiculopathy, lumbar region: Secondary | ICD-10-CM | POA: Diagnosis not present

## 2022-09-06 DIAGNOSIS — M1712 Unilateral primary osteoarthritis, left knee: Secondary | ICD-10-CM | POA: Diagnosis not present

## 2022-09-07 DIAGNOSIS — J302 Other seasonal allergic rhinitis: Secondary | ICD-10-CM | POA: Diagnosis not present

## 2022-09-07 DIAGNOSIS — H04121 Dry eye syndrome of right lacrimal gland: Secondary | ICD-10-CM | POA: Diagnosis not present

## 2022-09-07 DIAGNOSIS — H9209 Otalgia, unspecified ear: Secondary | ICD-10-CM | POA: Diagnosis not present

## 2022-09-08 DIAGNOSIS — G4733 Obstructive sleep apnea (adult) (pediatric): Secondary | ICD-10-CM | POA: Diagnosis not present

## 2022-09-13 ENCOUNTER — Ambulatory Visit: Payer: BC Managed Care – PPO | Admitting: Nurse Practitioner

## 2022-09-13 ENCOUNTER — Encounter: Payer: Self-pay | Admitting: Nurse Practitioner

## 2022-09-13 VITALS — BP 137/79 | HR 73 | Ht 60.0 in | Wt 217.0 lb

## 2022-09-13 DIAGNOSIS — E782 Mixed hyperlipidemia: Secondary | ICD-10-CM | POA: Diagnosis not present

## 2022-09-13 DIAGNOSIS — Z794 Long term (current) use of insulin: Secondary | ICD-10-CM

## 2022-09-13 DIAGNOSIS — I10 Essential (primary) hypertension: Secondary | ICD-10-CM | POA: Diagnosis not present

## 2022-09-13 DIAGNOSIS — E119 Type 2 diabetes mellitus without complications: Secondary | ICD-10-CM

## 2022-09-13 MED ORDER — TRULICITY 4.5 MG/0.5ML ~~LOC~~ SOAJ: 4.5000 mg | SUBCUTANEOUS | 3 refills | Status: DC

## 2022-09-13 MED ORDER — REPATHA SURECLICK 140 MG/ML ~~LOC~~ SOAJ: SUBCUTANEOUS | 3 refills | Status: DC

## 2022-09-13 MED ORDER — LANTUS SOLOSTAR 100 UNIT/ML ~~LOC~~ SOPN: 70.0000 [IU] | PEN_INJECTOR | Freq: Every day | SUBCUTANEOUS | 3 refills | Status: DC

## 2022-09-13 NOTE — Progress Notes (Signed)
09/13/2022                     Endocrinology follow-up note   Subjective:    Patient ID: Lisa Burns, female    DOB: 1961-05-25, PCP Celene Squibb, MD   Past Medical History:  Diagnosis Date  . Acute pharyngitis   . Allergy   . Asthma   . Constipation   . Cough   . Diabetes mellitus with neuropathy ([place])   . Diabetes mellitus without complication ([place])   . GERD (gastroesophageal reflux disease)   . Heart murmur   . Heart murmur   . Hyperlipidemia   . Hypertension   . Microproteinuria   . Neuropathy   . Obesity   . OSA (obstructive sleep apnea)   . Proteinuria   . Reflux   . Snoring   . Ulcer of mouth   . URI (upper respiratory infection)   . Venous stasis    Past Surgical History:  Procedure Laterality Date  . CESAREAN SECTION    . COLONOSCOPY N/A 04/24/2016   Procedure: COLONOSCOPY;  Surgeon: Danie Binder, MD;  Location: [facility];  Service: Endoscopy;  Laterality: N/A;  8:30 Am  . COLONOSCOPY N/A 07/18/2019   Dr. Oneida Alar: 6 simple adenomas removed.  External/internal hemorrhoids.  Next colonoscopy in 3 years.  Marland Kitchen LAPAROSCOPIC NISSEN FUNDOPLICATION  9211  . OTHER SURGICAL HISTORY     gerd surgery  . POLYPECTOMY  07/18/2019   Procedure: POLYPECTOMY;  Surgeon: Danie Binder, MD;  Location: [facility];  Service: Endoscopy;;  . THROAT SURGERY  2000   Social History   Socioeconomic History  . Marital status: Married    Spouse name: Not on file  . Number of children: 1  . Years of education: HS  . Highest education level: Not on file  Occupational History  . Occupation: [employer]   Tobacco Use  . Smoking status: Former    Packs/day: 1.00    Years: 5.00    Total pack years: 5.00    Types: Cigarettes  . Smokeless tobacco: Never  Vaping Use  . Vaping Use: Never used  Substance and Sexual Activity  . Alcohol use: Yes    Alcohol/week: 0.0 standard drinks of alcohol    Comment: occasionally  . Drug use: No  . Sexual activity: Yes    Birth  control/protection: None  Other Topics Concern  . Not on file  Social History Narrative   Drinks 2 cans of soda a day    Social Determinants of Health   Financial Resource Strain: Not on file  Food Insecurity: Not on file  Transportation Needs: Not on file  Physical Activity: Not on file  Stress: Not on file  Social Connections: Not on file   Outpatient Encounter Medications as of 09/13/2022  Medication Sig  . albuterol (PROAIR HFA) 108 (90 Base) MCG/ACT inhaler INHALE TWO PUFFS INTO THE LUNGS EVERY SIX HOURS AS NEEDED  . aspirin EC 81 MG tablet Take 1 tablet (81 mg total) by mouth daily. Swallow whole.  . Azelaic Acid 15 % cream Apply 1 application topically daily. Apply to face  . clobetasol (TEMOVATE) 0.05 % external solution Apply 1 application topically 2 (two) times daily.  . Dulaglutide (TRULICITY) 4.5 HE/1.7EY SOPN Inject 4.5 mg as directed once a week.  . enalapril (VASOTEC) 20 MG tablet TAKE ONE TABLET ('20MG'$  TOTAL) BY MOUTH ATBEDTIME  . Evolocumab (REPATHA SURECLICK) 814 MG/ML SOAJ INJECT  $'140MG'P$  INTO THE SKIN EVERY 14 DAYS  . fluticasone (FLONASE) 50 MCG/ACT nasal spray Place 1 spray into both nostrils daily.  . fluticasone (FLOVENT HFA) 110 MCG/ACT inhaler INHALE TWO PUFFS INTO THE LUNGS TWICE DAILY  . furosemide (LASIX) 40 MG tablet TAKE ONE TABLET ('40MG'$  TOTAL) BY MOUTH EVERY MORNING  . gabapentin (NEURONTIN) 300 MG capsule Take 300 mg by mouth daily.  Marland Kitchen gabapentin (NEURONTIN) 400 MG capsule TAKE ONE (1) CAPSULE THREE (3) TIMES EACH DAY  . glucose blood test strip Use as instructed  . insulin glargine (LANTUS SOLOSTAR) 100 UNIT/ML Solostar Pen Inject 70 Units into the skin at bedtime.  . isosorbide mononitrate (IMDUR) 30 MG 24 hr tablet Take 1 tablet (30 mg total) by mouth daily.  . lansoprazole (PREVACID) 15 MG capsule Take 1 capsule (15 mg total) by mouth daily.  Marland Kitchen loratadine (CLARITIN) 10 MG tablet Take 10 mg by mouth daily.   . metoprolol succinate (TOPROL XL) 25 MG  24 hr tablet Take 1 tablet (25 mg total) by mouth daily.  . nortriptyline (PAMELOR) 50 MG capsule TAKE TWO CAPSULES BY MOUTH AT BEDTIME  . valACYclovir (VALTREX) 500 MG tablet Take 500 mg by mouth 2 (two) times daily as needed (cold sore flare ups).   . [DISCONTINUED] Dulaglutide (TRULICITY) 4.5 SH/7.63YO SOPN Inject 4.5 mg as directed once a week.  . [DISCONTINUED] Evolocumab (REPATHA SURECLICK) 378 MG/ML SOAJ INJECT '140MG'$  INTO THE SKIN EVERY 14 DAYS  . [DISCONTINUED] insulin glargine (LANTUS SOLOSTAR) 100 UNIT/ML Solostar Pen Inject 70 Units into the skin at bedtime.   No facility-administered encounter medications on file as of 09/13/2022.   ALLERGIES: Allergies  Allergen Reactions  . Augmentin [Amoxicillin-Pot Clavulanate]     GI upset Did it involve swelling of the face/tongue/throat, SOB, or low BP? No Did it involve sudden or severe rash/hives, skin peeling, or any reaction on the inside of your mouth or nose? No Did you need to seek medical attention at a hospital or doctor's office? No When did it last happen?      15 years If all above answers are "NO", may proceed with cephalosporin use.   . Cinnamon Hives and Swelling  . Coconut Flavor Hives and Swelling  . Januvia [Sitagliptin]     GI upset  . Metformin Diarrhea  . Metformin And Related     GI upset  . Pravastatin     Pain in joints  . Strawberry (Diagnostic) Hives and Swelling   VACCINATION STATUS: Immunization History  Administered Date(s) Administered  . Influenza, Seasonal, Injecte, Preservative Fre 08/09/2016  . Influenza,inj,Quad PF,6+ Mos 12/27/2017  . PFIZER(Purple Top)SARS-COV-2 Vaccination 03/11/2020, 04/11/2020  . Pneumococcal Polysaccharide-23 12/30/2013    Diabetes She presents for her follow-up diabetic visit. She has type 2 diabetes mellitus. Onset time: She was diagnosed at approximate age of 61 years. Her disease course has been stable. There are no hypoglycemic associated symptoms. Pertinent  negatives for hypoglycemia include no confusion, headaches, pallor or seizures. There are no diabetic associated symptoms. Pertinent negatives for diabetes include no chest pain, no polydipsia, no polyphagia, no polyuria and no weight loss. There are no hypoglycemic complications. Symptoms are stable. There are no diabetic complications. Risk factors for coronary artery disease include diabetes mellitus, dyslipidemia, family history, hypertension, obesity, sedentary lifestyle and tobacco exposure. Current diabetic treatment includes insulin injections and oral agent (monotherapy) (And Trulicity). She is compliant with treatment most of the time. Her weight is decreasing steadily. She is following a generally healthy  diet. When asked about meal planning, she reported none. She has not had a previous visit with a dietitian. She never participates in exercise. Her home blood glucose trend is fluctuating minimally. Her breakfast blood glucose range is generally 130-140 mg/dl. (She presents today with her logs showing at goal fasting glycemic profile most days.  Her previsit A1c on 9/15 was 6.4%, improving from last visit of 6.8%.  She is happy with her progress!  She denies any hypoglycemia.) An ACE inhibitor/angiotensin II receptor blocker is being taken. She sees a podiatrist.Eye exam is current.  Hyperlipidemia This is a chronic problem. The current episode started more than 1 year ago. The problem is controlled. Recent lipid tests were reviewed and are normal. Exacerbating diseases include chronic renal disease, diabetes and obesity. Factors aggravating her hyperlipidemia include fatty foods. Pertinent negatives include no chest pain, myalgias or shortness of breath. Treatments tried: she does not tolerate statins; is on Repatha. The current treatment provides no improvement of lipids. Compliance problems include medication side effects, adherence to diet and adherence to exercise.  Risk factors for coronary  artery disease include diabetes mellitus, dyslipidemia, hypertension, obesity and a sedentary lifestyle.  Hypertension This is a chronic problem. The current episode started more than 1 year ago. The problem has been gradually improving since onset. The problem is controlled. Associated symptoms include peripheral edema. Pertinent negatives include no chest pain, headaches, palpitations or shortness of breath. There are no associated agents to hypertension. Risk factors for coronary artery disease include dyslipidemia, diabetes mellitus, obesity, sedentary lifestyle and post-menopausal state. Past treatments include ACE inhibitors and diuretics. The current treatment provides mild improvement. Compliance problems include diet and exercise.  Hypertensive end-organ damage includes kidney disease. Identifiable causes of hypertension include chronic renal disease.    Review of systems  Constitutional: + decreasing body weight,  current Body mass index is 42.38 kg/m. , no fatigue, no subjective hyperthermia, no subjective hypothermia Eyes: no blurry vision, no xerophthalmia ENT: no sore throat, no nodules palpated in throat, no dysphagia/odynophagia, no hoarseness Cardiovascular: no chest pain, no shortness of breath, no palpitations, no leg swelling Respiratory: no cough, no shortness of breath Gastrointestinal: no nausea/vomiting/diarrhea Musculoskeletal: no muscle/joint aches Skin: no rashes, no hyperemia Neurological: no tremors, no numbness, no tingling, no dizziness Psychiatric: no depression, no anxiety    Objective:    BP 137/79   Pulse 73   Ht 5' (1.524 m)   Wt 217 lb (98.4 kg)   BMI 42.38 kg/m   Wt Readings from Last 3 Encounters:  09/13/22 217 lb (98.4 kg)  06/13/22 222 lb (100.7 kg)  05/11/22 222 lb (100.7 kg)    BP Readings from Last 3 Encounters:  09/13/22 137/79  06/13/22 130/70  05/17/22 (!) 154/89     Physical Exam- Limited  Constitutional:  Body mass index is  42.38 kg/m. , not in acute distress, normal state of mind Eyes:  EOMI, no exophthalmos Neck: Supple Cardiovascular: RRR, + murmur, rubs, or gallops, no edema Respiratory: Adequate breathing efforts, no crackles, rales, rhonchi, or wheezing Musculoskeletal: no gross deformities, strength intact in all four extremities, no gross restriction of joint movements Skin:  no rashes, no hyperemia Neurological: no tremor with outstretched hands  Diabetic Foot Exam - Simple   No data filed     Results for orders placed or performed in visit on 05/18/22  ECHOCARDIOGRAM COMPLETE  Result Value Ref Range   Area-P 1/2 3.30 cm2   S' Lateral 2.50 cm  Diabetic Labs (most recent): Lab Results  Component Value Date   HGBA1C 6.8 03/30/2022   HGBA1C 8.5 (A) 01/11/2022   HGBA1C 5.6 07/11/2021   MICROALBUR 10.0 03/30/2022   MICROALBUR 10 07/11/2021   MICROALBUR 10 09/06/2020   Lipid Panel     Component Value Date/Time   CHOL 93 03/30/2022 0000   TRIG 122 03/30/2022 0000   HDL 50 03/30/2022 0000   CHOLHDL 1.9 07/07/2021 0720   VLDL 43 (H) 11/29/2016 0729   LDLCALC 21 03/30/2022 0000   LDLCALC 34 07/07/2021 0720     Assessment & Plan:   1) Type 2 Diabetes Mellitus without complication with long-term current use of insulin  She presents today with her logs showing at goal fasting glycemic profile most days.  Her previsit A1c on 9/15 was 6.4%, improving from last visit of 6.8%.  She is happy with her progress!  She denies any hypoglycemia.  - She  remains at a high risk for more acute and chronic complications of diabetes which include CAD, CVA, CKD, retinopathy, and neuropathy. These are all discussed in detail with the patient.    Recent labs reviewed.  - Nutritional counseling repeated at each appointment due to patients tendency to fall back in to old habits.  - The patient admits there is a room for improvement in their diet and drink choices. -  Suggestion is made for the patient  to avoid simple carbohydrates from their diet including Cakes, Sweet Desserts / Pastries, Ice Cream, Soda (diet and regular), Sweet Tea, Candies, Chips, Cookies, Sweet Pastries, Store Bought Juices, Alcohol in Excess of 1-2 drinks a day, Artificial Sweeteners, Coffee Creamer, and "Sugar-free" Products. This will help patient to have stable blood glucose profile and potentially avoid unintended weight gain.   - I encouraged the patient to switch to unprocessed or minimally processed complex starch and increased protein intake (animal or plant source), fruits, and vegetables.   - Patient is advised to stick to a routine mealtimes to eat 3 meals a day and avoid unnecessary snacks (to snack only to correct hypoglycemia).  - I have approached patient with the following individualized plan to manage diabetes and patient agrees.  - No changes will be made to her medication regimen today.  She can continue Trulicity 4.5 mg SQ weekly and Lantus 70 units SQ nightly.   -She is encouraged to continue monitoring blood glucose at least twice daily, before breakfast and before bed, and call the clinic if she has readings less than 70 or greater than 200 for 3 tests in a row.  - Patient specific target  for A1c; LDL, HDL, Triglycerides, and  Waist Circumference were discussed in detail.  2) BP/HTN: Her blood pressure is controlled to target.  She is advised to continue Enalapril 20 mg po daily and Lasix 40 mg po daily.    3) Lipids/HPL:  Her recent lipid panel from 08/04/22 shows controlled LDL of 32.  She does not tolerate statins.  She is advised to continue Repatha 140 mg SQ every 2 weeks.    4)  Weight/Diet:  Her Body mass index is 42.38 kg/m.--  She is a candidate for modest weight loss.  Exercise, and carbohydrates information provided.  5) Chronic Care/Health Maintenance: -Patient is on ACEI/ARB medications and encouraged to continue to follow up with Ophthalmology, Podiatrist at least yearly or  according to recommendations, and advised to  stay away from smoking. I have recommended yearly flu vaccine and pneumonia vaccination at least  every 5 years; moderate intensity exercise for up to 150 minutes weekly; and  sleep for at least 7 hours a day.  - I advised patient to maintain close follow up with Celene Squibb, MD for primary care needs.      I spent 30 minutes in the care of the patient today including review of labs from [facility], Lipids, Thyroid Function, Hematology (current and previous including abstractions from other facilities); face-to-face time discussing  her blood glucose readings/logs, discussing hypoglycemia and hyperglycemia episodes and symptoms, medications doses, her options of short and long term treatment based on the latest standards of care / guidelines;  discussion about incorporating lifestyle medicine;  and documenting the encounter. Risk reduction counseling performed per USPSTF guidelines to reduce obesity and cardiovascular risk factors.     Please refer to Patient Instructions for Blood Glucose Monitoring and Insulin/Medications Dosing Guide"  in media tab for additional information. Please  also refer to " Patient Self Inventory" in the Media  tab for reviewed elements of pertinent patient history.  Mearl Latin Varnum participated in the discussions, expressed understanding, and voiced agreement with the above plans.  All questions were answered to her satisfaction. she is encouraged to contact clinic should she have any questions or concerns prior to her return visit.    Follow up plan: -Return in about 6 months (around 03/15/2023) for Diabetes F/U with A1c in office, No previsit labs, Bring meter and logs.    Rayetta Pigg, [facility] [address] [place], [place] [address] Phone: [phone] Fax: [phone]  09/13/2022, 4:04 PM

## 2022-09-20 ENCOUNTER — Other Ambulatory Visit (HOSPITAL_COMMUNITY): Payer: Self-pay

## 2022-09-22 ENCOUNTER — Ambulatory Visit
Admission: RE | Admit: 2022-09-22 | Discharge: 2022-09-22 | Disposition: A | Discharge status: Home / Self Care | Payer: BC Managed Care – PPO | Source: Ambulatory Visit | Attending: Sports Medicine | Admitting: Sports Medicine

## 2022-09-22 DIAGNOSIS — M47816 Spondylosis without myelopathy or radiculopathy, lumbar region: Secondary | ICD-10-CM

## 2022-09-22 DIAGNOSIS — M5416 Radiculopathy, lumbar region: Secondary | ICD-10-CM

## 2022-09-22 DIAGNOSIS — M545 Low back pain, unspecified: Secondary | ICD-10-CM | POA: Diagnosis not present

## 2022-09-26 DIAGNOSIS — M48061 Spinal stenosis, lumbar region without neurogenic claudication: Secondary | ICD-10-CM | POA: Diagnosis not present

## 2022-09-26 DIAGNOSIS — M25562 Pain in left knee: Secondary | ICD-10-CM | POA: Diagnosis not present

## 2022-10-08 DIAGNOSIS — G4733 Obstructive sleep apnea (adult) (pediatric): Secondary | ICD-10-CM | POA: Diagnosis not present

## 2022-10-18 DIAGNOSIS — M5416 Radiculopathy, lumbar region: Secondary | ICD-10-CM | POA: Diagnosis not present

## 2022-11-09 DIAGNOSIS — M5416 Radiculopathy, lumbar region: Secondary | ICD-10-CM | POA: Diagnosis not present

## 2023-01-08 ENCOUNTER — Other Ambulatory Visit: Payer: BC Managed Care – PPO

## 2023-01-10 ENCOUNTER — Ambulatory Visit
Admission: RE | Admit: 2023-01-10 | Discharge: 2023-01-10 | Disposition: A | Discharge status: Home / Self Care | Payer: BC Managed Care – PPO | Source: Ambulatory Visit | Attending: Obstetrics and Gynecology | Admitting: Obstetrics and Gynecology

## 2023-01-10 DIAGNOSIS — N6320 Unspecified lump in the left breast, unspecified quadrant: Secondary | ICD-10-CM

## 2023-01-10 DIAGNOSIS — N6002 Solitary cyst of left breast: Secondary | ICD-10-CM | POA: Diagnosis not present

## 2023-01-10 DIAGNOSIS — N6321 Unspecified lump in the left breast, upper outer quadrant: Secondary | ICD-10-CM | POA: Diagnosis not present

## 2023-01-18 ENCOUNTER — Encounter: Payer: Self-pay | Admitting: Radiology

## 2023-01-19 ENCOUNTER — Encounter: Payer: Self-pay | Admitting: *Deleted

## 2023-01-19 DIAGNOSIS — M5416 Radiculopathy, lumbar region: Secondary | ICD-10-CM | POA: Diagnosis not present

## 2023-02-02 ENCOUNTER — Other Ambulatory Visit (HOSPITAL_COMMUNITY): Payer: Self-pay

## 2023-02-09 DIAGNOSIS — E785 Hyperlipidemia, unspecified: Secondary | ICD-10-CM | POA: Diagnosis not present

## 2023-02-09 DIAGNOSIS — I10 Essential (primary) hypertension: Secondary | ICD-10-CM | POA: Diagnosis not present

## 2023-02-09 DIAGNOSIS — E119 Type 2 diabetes mellitus without complications: Secondary | ICD-10-CM | POA: Diagnosis not present

## 2023-02-09 LAB — LIPID PANEL
LDL Cholesterol: 22
Triglycerides: 155 (ref 40–160)

## 2023-02-09 LAB — HEMOGLOBIN A1C: Hemoglobin A1C: 7.1

## 2023-02-15 DIAGNOSIS — E1140 Type 2 diabetes mellitus with diabetic neuropathy, unspecified: Secondary | ICD-10-CM | POA: Diagnosis not present

## 2023-02-15 DIAGNOSIS — E119 Type 2 diabetes mellitus without complications: Secondary | ICD-10-CM | POA: Diagnosis not present

## 2023-02-15 DIAGNOSIS — H9209 Otalgia, unspecified ear: Secondary | ICD-10-CM | POA: Diagnosis not present

## 2023-02-15 DIAGNOSIS — Z0001 Encounter for general adult medical examination with abnormal findings: Secondary | ICD-10-CM | POA: Diagnosis not present

## 2023-02-15 DIAGNOSIS — H04121 Dry eye syndrome of right lacrimal gland: Secondary | ICD-10-CM | POA: Diagnosis not present

## 2023-02-15 DIAGNOSIS — I10 Essential (primary) hypertension: Secondary | ICD-10-CM | POA: Diagnosis not present

## 2023-02-15 DIAGNOSIS — J302 Other seasonal allergic rhinitis: Secondary | ICD-10-CM | POA: Diagnosis not present

## 2023-02-15 DIAGNOSIS — E785 Hyperlipidemia, unspecified: Secondary | ICD-10-CM | POA: Diagnosis not present

## 2023-02-15 DIAGNOSIS — G4733 Obstructive sleep apnea (adult) (pediatric): Secondary | ICD-10-CM | POA: Diagnosis not present

## 2023-02-22 ENCOUNTER — Telehealth: Payer: Self-pay | Admitting: Pharmacy Technician

## 2023-02-22 ENCOUNTER — Other Ambulatory Visit (HOSPITAL_COMMUNITY): Payer: Self-pay

## 2023-02-22 NOTE — Telephone Encounter (Signed)
Pharmacy Patient Advocate Encounter   Received notification from [facility] that prior authorization for Repatha is required/requested.   PA submitted on 02/22/23 to (ins)  [employer] via CoverMyMeds Key or (Medicaid) confirmation # [id number]  Prior Authorization has been approved.    PA # PA Case: [id number] Effective dates: 02/22/23 through 02/22/24  Spoke with Pharmacy to process. Copay is over $200. She had a copay card that has expired, but should be able to get a new one or switch to mail order for a lower coapy.

## 2023-03-15 ENCOUNTER — Ambulatory Visit: Payer: BC Managed Care – PPO | Admitting: Nurse Practitioner

## 2023-03-15 DIAGNOSIS — I10 Essential (primary) hypertension: Secondary | ICD-10-CM

## 2023-03-15 DIAGNOSIS — Z794 Long term (current) use of insulin: Secondary | ICD-10-CM

## 2023-03-16 DIAGNOSIS — K140 Glossitis: Secondary | ICD-10-CM | POA: Diagnosis not present

## 2023-03-19 ENCOUNTER — Ambulatory Visit: Payer: BC Managed Care – PPO | Admitting: Nurse Practitioner

## 2023-03-19 ENCOUNTER — Encounter: Payer: Self-pay | Admitting: Nurse Practitioner

## 2023-03-19 VITALS — BP 128/82 | HR 70 | Ht 60.0 in | Wt 220.2 lb

## 2023-03-19 DIAGNOSIS — I10 Essential (primary) hypertension: Secondary | ICD-10-CM

## 2023-03-19 DIAGNOSIS — E119 Type 2 diabetes mellitus without complications: Secondary | ICD-10-CM

## 2023-03-19 DIAGNOSIS — Z794 Long term (current) use of insulin: Secondary | ICD-10-CM | POA: Diagnosis not present

## 2023-03-19 DIAGNOSIS — E782 Mixed hyperlipidemia: Secondary | ICD-10-CM | POA: Diagnosis not present

## 2023-03-19 NOTE — Progress Notes (Signed)
03/19/2023                     Endocrinology follow-up note   Subjective:    Patient ID: Lisa Burns, female    DOB: 1961-08-27, PCP Benita Stabile, MD   Past Medical History:  Diagnosis Date   Acute pharyngitis    Allergy    Asthma    Constipation    Cough    Diabetes mellitus with neuropathy (HCC)    Diabetes mellitus without complication (HCC)    GERD (gastroesophageal reflux disease)    Heart murmur    Heart murmur    Hyperlipidemia    Hypertension    Microproteinuria    Neuropathy    Obesity    OSA (obstructive sleep apnea)    Proteinuria    Reflux    Snoring    Ulcer of mouth    URI (upper respiratory infection)    Venous stasis    Past Surgical History:  Procedure Laterality Date   CESAREAN SECTION     COLONOSCOPY N/A 04/24/2016   Procedure: COLONOSCOPY;  Surgeon: West Bali, MD;  Location: [facility];  Service: Endoscopy;  Laterality: N/A;  8:30 Am   COLONOSCOPY N/A 07/18/2019   Dr. Darrick Penna: 6 simple adenomas removed.  External/internal hemorrhoids.  Next colonoscopy in 3 years.   LAPAROSCOPIC NISSEN FUNDOPLICATION  2000   OTHER SURGICAL HISTORY     gerd surgery   POLYPECTOMY  07/18/2019   Procedure: POLYPECTOMY;  Surgeon: West Bali, MD;  Location: [facility];  Service: Endoscopy;;   THROAT SURGERY  2000   Social History   Socioeconomic History   Marital status: Married    Spouse name: Not on file   Number of children: 1   Years of education: HS   Highest education level: Not on file  Occupational History   Occupation: [employer]   Tobacco Use   Smoking status: Former    Packs/day: 1.00    Years: 5.00    Additional pack years: 0.00    Total pack years: 5.00    Types: Cigarettes   Smokeless tobacco: Never  Vaping Use   Vaping Use: Never used  Substance and Sexual Activity   Alcohol use: Yes    Alcohol/week: 0.0 standard drinks of alcohol    Comment: occasionally   Drug use: No   Sexual activity: Yes    Birth  control/protection: None  Other Topics Concern   Not on file  Social History Narrative   Drinks 2 cans of soda a day    Social Determinants of Health   Financial Resource Strain: Not on file  Food Insecurity: Not on file  Transportation Needs: Not on file  Physical Activity: Not on file  Stress: Not on file  Social Connections: Not on file   Outpatient Encounter Medications as of 03/19/2023  Medication Sig   albuterol (PROAIR HFA) 108 (90 Base) MCG/ACT inhaler INHALE TWO PUFFS INTO THE LUNGS EVERY SIX HOURS AS NEEDED   aspirin EC 81 MG tablet Take 1 tablet (81 mg total) by mouth daily. Swallow whole.   Azelaic Acid 15 % cream Apply 1 application topically daily. Apply to face   clobetasol (TEMOVATE) 0.05 % external solution Apply 1 application topically 2 (two) times daily.   Dulaglutide (TRULICITY) 4.5 MG/0.5ML SOPN Inject 4.5 mg as directed once a week.   enalapril (VASOTEC) 20 MG tablet TAKE ONE TABLET (20MG  TOTAL) BY MOUTH ATBEDTIME  Evolocumab (REPATHA SURECLICK) 140 MG/ML SOAJ INJECT 140MG  INTO THE SKIN EVERY 14 DAYS   fluticasone (FLONASE) 50 MCG/ACT nasal spray Place 1 spray into both nostrils daily.   fluticasone (FLOVENT HFA) 110 MCG/ACT inhaler INHALE TWO PUFFS INTO THE LUNGS TWICE DAILY   furosemide (LASIX) 40 MG tablet TAKE ONE TABLET (40MG  TOTAL) BY MOUTH EVERY MORNING   gabapentin (NEURONTIN) 300 MG capsule Take 300 mg by mouth daily.   gabapentin (NEURONTIN) 400 MG capsule TAKE ONE (1) CAPSULE THREE (3) TIMES EACH DAY   glucose blood test strip Use as instructed   insulin glargine (LANTUS SOLOSTAR) 100 UNIT/ML Solostar Pen Inject 70 Units into the skin at bedtime.   isosorbide mononitrate (IMDUR) 30 MG 24 hr tablet Take 1 tablet (30 mg total) by mouth daily.   lansoprazole (PREVACID) 15 MG capsule Take 1 capsule (15 mg total) by mouth daily.   loratadine (CLARITIN) 10 MG tablet Take 10 mg by mouth daily.    metoprolol succinate (TOPROL XL) 25 MG 24 hr tablet Take 1  tablet (25 mg total) by mouth daily.   nortriptyline (PAMELOR) 50 MG capsule TAKE TWO CAPSULES BY MOUTH AT BEDTIME   valACYclovir (VALTREX) 500 MG tablet Take 500 mg by mouth 2 (two) times daily as needed (cold sore flare ups).    No facility-administered encounter medications on file as of 03/19/2023.   ALLERGIES: Allergies  Allergen Reactions   Augmentin [Amoxicillin-Pot Clavulanate]     GI upset Did it involve swelling of the face/tongue/throat, SOB, or low BP? No Did it involve sudden or severe rash/hives, skin peeling, or any reaction on the inside of your mouth or nose? No Did you need to seek medical attention at a hospital or doctor's office? No When did it last happen?      15 years If all above answers are "NO", may proceed with cephalosporin use.    Cinnamon Hives and Swelling   Coconut Flavor Hives and Swelling   Januvia [Sitagliptin]     GI upset   Metformin Diarrhea   Metformin And Related     GI upset   Pravastatin     Pain in joints   Strawberry (Diagnostic) Hives and Swelling   VACCINATION STATUS: Immunization History  Administered Date(s) Administered   Influenza, Seasonal, Injecte, Preservative Fre 08/09/2016   Influenza,inj,Quad PF,6+ Mos 12/27/2017   PFIZER(Purple Top)SARS-COV-2 Vaccination 03/11/2020, 04/11/2020   Pneumococcal Polysaccharide-23 12/30/2013    Diabetes She presents for her follow-up diabetic visit. She has type 2 diabetes mellitus. Onset time: She was diagnosed at approximate age of 48 years. Her disease course has been stable. There are no hypoglycemic associated symptoms. Pertinent negatives for hypoglycemia include no confusion, headaches, pallor or seizures. There are no diabetic associated symptoms. Pertinent negatives for diabetes include no chest pain, no polydipsia, no polyphagia, no polyuria and no weight loss. There are no hypoglycemic complications. Symptoms are stable. There are no diabetic complications. Risk factors for coronary  artery disease include diabetes mellitus, dyslipidemia, family history, hypertension, obesity, sedentary lifestyle and tobacco exposure. Current diabetic treatment includes insulin injections and oral agent (monotherapy) (And Trulicity). She is compliant with treatment most of the time. Her weight is fluctuating minimally. She is following a generally healthy diet. When asked about meal planning, she reported none. She has not had a previous visit with a dietitian. She never participates in exercise. Her home blood glucose trend is fluctuating minimally. Her breakfast blood glucose range is generally 110-130 mg/dl. (She presents today with  her logs showing at target fasting glycemic profile.  Her most recent A1c on 3/22 by PCP was 7.1%, increasing slightly from 6.8%.  she notes she recently had oral surgery for ulcer, awaiting pathology results.  She denies any hypoglycemia.) An ACE inhibitor/angiotensin II receptor blocker is being taken. She sees a podiatrist.Eye exam is current.  Hyperlipidemia This is a chronic problem. The current episode started more than 1 year ago. The problem is controlled. Recent lipid tests were reviewed and are normal. Exacerbating diseases include chronic renal disease, diabetes and obesity. Factors aggravating her hyperlipidemia include fatty foods. Pertinent negatives include no chest pain, myalgias or shortness of breath. Treatments tried: she does not tolerate statins; is on Repatha. The current treatment provides no improvement of lipids. Compliance problems include medication side effects, adherence to diet and adherence to exercise.  Risk factors for coronary artery disease include diabetes mellitus, dyslipidemia, hypertension, obesity and a sedentary lifestyle.  Hypertension This is a chronic problem. The current episode started more than 1 year ago. The problem has been gradually improving since onset. The problem is controlled. Associated symptoms include peripheral edema.  Pertinent negatives include no chest pain, headaches, palpitations or shortness of breath. There are no associated agents to hypertension. Risk factors for coronary artery disease include dyslipidemia, diabetes mellitus, obesity, sedentary lifestyle and post-menopausal state. Past treatments include ACE inhibitors and diuretics. The current treatment provides mild improvement. Compliance problems include diet and exercise.  Hypertensive end-organ damage includes kidney disease. Identifiable causes of hypertension include chronic renal disease.    Review of systems  Constitutional: + minimally fluctuating body weight,  current Body mass index is 43 kg/m. , no fatigue, no subjective hyperthermia, no subjective hypothermia Eyes: no blurry vision, no xerophthalmia ENT: no sore throat, no nodules palpated in throat, no dysphagia/odynophagia, no hoarseness Cardiovascular: no chest pain, no shortness of breath, no palpitations, no leg swelling Respiratory: no cough, no shortness of breath Gastrointestinal: no nausea/vomiting/diarrhea Musculoskeletal: no muscle/joint aches Skin: no rashes, no hyperemia Neurological: no tremors, no numbness, no tingling, no dizziness Psychiatric: no depression, no anxiety    Objective:    BP 128/82 (BP Location: Right Arm, Patient Position: Sitting, Cuff Size: Large) Comment: retake BP  Pulse 70   Ht 5' (1.524 m)   Wt 220 lb 3.2 oz (99.9 kg)   BMI 43.00 kg/m   Wt Readings from Last 3 Encounters:  03/19/23 220 lb 3.2 oz (99.9 kg)  09/13/22 217 lb (98.4 kg)  06/13/22 222 lb (100.7 kg)    BP Readings from Last 3 Encounters:  03/19/23 128/82  09/13/22 137/79  06/13/22 130/70     Physical Exam- Limited  Constitutional:  Body mass index is 43 kg/m. , not in acute distress, normal state of mind Eyes:  EOMI, no exophthalmos Musculoskeletal: no gross deformities, strength intact in all four extremities, no gross restriction of joint movements Skin:  no  rashes, no hyperemia Neurological: no tremor with outstretched hands  Diabetic Foot Exam - Simple   Simple Foot Form Diabetic Foot exam was performed with the following findings: Yes 03/19/2023  3:54 PM  Visual Inspection No deformities, no ulcerations, no other skin breakdown bilaterally: Yes Sensation Testing Intact to touch and monofilament testing bilaterally: Yes Pulse Check Posterior Tibialis and Dorsalis pulse intact bilaterally: Yes Comments     Results for orders placed or performed in visit on 05/18/22  ECHOCARDIOGRAM COMPLETE  Result Value Ref Range   Area-P 1/2 3.30 cm2   S' Lateral 2.50  cm   Diabetic Labs (most recent): Lab Results  Component Value Date   HGBA1C 6.8 03/30/2022   HGBA1C 8.5 (A) 01/11/2022   HGBA1C 5.6 07/11/2021   MICROALBUR 10.0 03/30/2022   MICROALBUR 10 07/11/2021   MICROALBUR 10 09/06/2020   Lipid Panel     Component Value Date/Time   CHOL 93 03/30/2022 0000   TRIG 122 03/30/2022 0000   HDL 50 03/30/2022 0000   CHOLHDL 1.9 07/07/2021 0720   VLDL 43 (H) 11/29/2016 0729   LDLCALC 21 03/30/2022 0000   LDLCALC 34 07/07/2021 0720     Assessment & Plan:   1) Type 2 Diabetes Mellitus without complication with long-term current use of insulin  She presents today with her logs showing at target fasting glycemic profile.  Her most recent A1c on 3/22 by PCP was 7.1%, increasing slightly from 6.8%.  she notes she recently had oral surgery for ulcer, awaiting pathology results.  She denies any hypoglycemia.  - She remains at a high risk for more acute and chronic complications of diabetes which include CAD, CVA, CKD, retinopathy, and neuropathy. These are all discussed in detail with the patient.    Recent labs reviewed.  - Nutritional counseling repeated at each appointment due to patients tendency to fall back in to old habits.  - The patient admits there is a room for improvement in their diet and drink choices. -  Suggestion is made for  the patient to avoid simple carbohydrates from their diet including Cakes, Sweet Desserts / Pastries, Ice Cream, Soda (diet and regular), Sweet Tea, Candies, Chips, Cookies, Sweet Pastries, Store Bought Juices, Alcohol in Excess of 1-2 drinks a day, Artificial Sweeteners, Coffee Creamer, and "Sugar-free" Products. This will help patient to have stable blood glucose profile and potentially avoid unintended weight gain.   - I encouraged the patient to switch to unprocessed or minimally processed complex starch and increased protein intake (animal or plant source), fruits, and vegetables.   - Patient is advised to stick to a routine mealtimes to eat 3 meals a day and avoid unnecessary snacks (to snack only to correct hypoglycemia).  - I have approached patient with the following individualized plan to manage diabetes and patient agrees.  - Given her stable glycemic profile and lack of hypoglycemia, no changes will be made to her medication regimen today.  She can continue Trulicity 4.5 mg SQ weekly and Lantus 70 units SQ nightly.   -She is encouraged to continue monitoring blood glucose at least twice daily, before breakfast and before bed, and call the clinic if she has readings less than 70 or greater than 200 for 3 tests in a row.  - Patient specific target  for A1c; LDL, HDL, Triglycerides, and  Waist Circumference were discussed in detail.  2) BP/HTN: Her blood pressure is controlled to target.  She is advised to continue Enalapril 20 mg po daily and Lasix 40 mg po daily.    3) Lipids/HPL:  Her recent lipid panel from 08/04/22 shows controlled LDL of 32.  She does not tolerate statins.  She is advised to continue Repatha 140 mg SQ every 2 weeks.    4)  Weight/Diet:  Her Body mass index is 43 kg/m.--  She is a candidate for modest weight loss.  Exercise, and carbohydrates information provided.  5) Chronic Care/Health Maintenance: -Patient is on ACEI/ARB medications and encouraged to continue  to follow up with Ophthalmology, Podiatrist at least yearly or according to recommendations, and advised to  stay away from smoking. I have recommended yearly flu vaccine and pneumonia vaccination at least every 5 years; moderate intensity exercise for up to 150 minutes weekly; and  sleep for at least 7 hours a day.  - I advised patient to maintain close follow up with Benita Stabile, MD for primary care needs.     I spent  28  minutes in the care of the patient today including review of labs from CMP, Lipids, Thyroid Function, Hematology (current and previous including abstractions from other facilities); face-to-face time discussing  her blood glucose readings/logs, discussing hypoglycemia and hyperglycemia episodes and symptoms, medications doses, her options of short and long term treatment based on the latest standards of care / guidelines;  discussion about incorporating lifestyle medicine;  and documenting the encounter. Risk reduction counseling performed per USPSTF guidelines to reduce obesity and cardiovascular risk factors.     Please refer to Patient Instructions for Blood Glucose Monitoring and Insulin/Medications Dosing Guide"  in media tab for additional information. Please  also refer to " Patient Self Inventory" in the Media  tab for reviewed elements of pertinent patient history.  Lisa Burns participated in the discussions, expressed understanding, and voiced agreement with the above plans.  All questions were answered to her satisfaction. she is encouraged to contact clinic should she have any questions or concerns prior to her return visit.    Follow up plan: -Return in about 6 months (around 09/18/2023) for Diabetes F/U with A1c in office, No previsit labs, Bring meter and logs.   Ronny Bacon, [facility] [address] [place], [place] [address] Phone: [phone] Fax: [phone]  03/19/2023, 3:56 PM

## 2023-05-04 ENCOUNTER — Telehealth: Payer: Self-pay | Admitting: *Deleted

## 2023-05-04 NOTE — Telephone Encounter (Signed)
Primary Cardiologist:Lisa Anne Fu, MD  Chart reviewed as part of pre-operative protocol coverage. Because of Lisa Burns's past medical history and time since last visit, he/she will require a follow-up visit in order to better assess preoperative cardiovascular risk.  Pre-op covering staff: - Please schedule appointment and call patient to inform them. - Please contact requesting surgeon's office via preferred method (i.e, phone, fax) to inform them of need for appointment prior to surgery.  Per office protocol and pending no concerning symptoms at the time of the visit, she may hold aspirin for 5-7 days prior to procedure and should resume as soon as hemodynamically stable postoperatively.  Levi Aland, NP-C  05/04/2023, 3:07 PM [address], [address] Office [phone] Fax [phone]

## 2023-05-04 NOTE — Telephone Encounter (Signed)
   Pre-operative Risk Assessment    Patient Name: Lisa Burns  DOB: 08-14-1961 MRN: [id number]      Request for Surgical Clearance    Procedure:   Lumbar laminotomy  Date of Surgery:  Clearance 05/10/23                                 Surgeon:  Dr. Hoyt Koch Surgeon's Group or Practice Name:  [facility] Phone number:  [phone] Fax number:  [phone]   Type of Clearance Requested:   - Medical  - Pharmacy:  Hold Aspirin Not Indicated   Type of Anesthesia:  General    Additional requests/questions:    Signed, Emmit Pomfret   05/04/2023, 2:38 PM

## 2023-05-04 NOTE — Telephone Encounter (Signed)
Spoke with patient who is agreeable to see Robin Searing, NP on 6/18 at 1:55 pm.

## 2023-05-07 NOTE — Progress Notes (Unsigned)
Office Visit    Patient Name: Lisa Burns Date of Encounter: 05/08/2023  Primary Care Provider:  Benita Stabile, MD Primary Cardiologist:  Donato Schultz, MD Primary Electrophysiologist: None   Past Medical History    Past Medical History:  Diagnosis Date   Acute pharyngitis    Allergy    Asthma    Constipation    Cough    Diabetes mellitus with neuropathy (HCC)    Diabetes mellitus without complication (HCC)    GERD (gastroesophageal reflux disease)    Heart murmur    Heart murmur    Hyperlipidemia    Hypertension    Microproteinuria    Neuropathy    Obesity    OSA (obstructive sleep apnea)    Proteinuria    Reflux    Snoring    Ulcer of mouth    URI (upper respiratory infection)    Venous stasis    Past Surgical History:  Procedure Laterality Date   CESAREAN SECTION     COLONOSCOPY N/A 04/24/2016   Procedure: COLONOSCOPY;  Surgeon: West Bali, MD;  Location: [facility];  Service: Endoscopy;  Laterality: N/A;  8:30 Am   COLONOSCOPY N/A 07/18/2019   Dr. Darrick Penna: 6 simple adenomas removed.  External/internal hemorrhoids.  Next colonoscopy in 3 years.   LAPAROSCOPIC NISSEN FUNDOPLICATION  2000   OTHER SURGICAL HISTORY     gerd surgery   POLYPECTOMY  07/18/2019   Procedure: POLYPECTOMY;  Surgeon: West Bali, MD;  Location: [facility];  Service: Endoscopy;;   THROAT SURGERY  2000    Allergies  Allergies  Allergen Reactions   Augmentin [Amoxicillin-Pot Clavulanate]     GI upset Did it involve swelling of the face/tongue/throat, SOB, or low BP? No Did it involve sudden or severe rash/hives, skin peeling, or any reaction on the inside of your mouth or nose? No Did you need to seek medical attention at a hospital or doctor's office? No When did it last happen?      15 years If all above answers are "NO", may proceed with cephalosporin use.    Cinnamon Hives and Swelling   Coconut Flavor Hives and Swelling   Januvia [Sitagliptin]     GI upset    Metformin Diarrhea   Metformin And Related     GI upset   Pravastatin     Pain in joints   Strawberry (Diagnostic) Hives and Swelling     History of Present Illness    Lisa Burns  is a 62 year old female with a PMH of  CAD, aortic calcification,family history of CAD, DM type II, HLD, HTN, murmur,  Former tobacco abuser who presents today for preoperative clearance.  Lisa Burns was seen initially by Dr. Anne Fu and 04/2022  For evaluation of heart murmur.  Patient had 2D echo completed showing EF of 60 to 65% with no RWMA and trivial MVR.  Patient completed a coronary CTA that showed calcium score of 13 with 50-69% gnosis of the RCA and 70-99% stenosis of the LAD with mild athero-atherosclerotic plaque in the proximal left circumflex 25- 49%  and mild calcifications in the aorta.  Analysis of FFR indicated significant gnosis of first diagonal branch with recommendation for coronary angiography if clinically   Lisa Burns presents today for preoperative clearance visit.  Since last being seen in the office patient reports that she has been doing well with no cardiac complaints or concerns since her previous visit.  Her  blood pressure today is controlled at 118/68 and heart rate was 81 bpm.  She is compliant with her current medications and denies any adverse reactions.  She is scheduled to undergo lumbar laminectomy procedure.  Patient denies chest pain, palpitations, dyspnea, PND, orthopnea, nausea, vomiting, dizziness, syncope, edema, weight gain, or early satiety.   Home Medications    Current Outpatient Medications  Medication Sig Dispense Refill   albuterol (PROAIR HFA) 108 (90 Base) MCG/ACT inhaler INHALE TWO PUFFS INTO THE LUNGS EVERY SIX HOURS AS NEEDED 8.5 g 5   aspirin EC 81 MG tablet Take 1 tablet (81 mg total) by mouth daily. Swallow whole. 90 tablet 3   Azelaic Acid 15 % cream Apply 1 application topically daily. Apply to face     clobetasol (TEMOVATE) 0.05 % external solution Apply  1 application topically 2 (two) times daily.     cyclobenzaprine (FLEXERIL) 10 MG tablet Take 10 mg by mouth as needed for muscle spasms.     Dulaglutide (TRULICITY) 4.5 MG/0.5ML SOPN Inject 4.5 mg as directed once a week. 6 mL 3   enalapril (VASOTEC) 20 MG tablet TAKE ONE TABLET (20MG  TOTAL) BY MOUTH ATBEDTIME 90 tablet 0   Evolocumab (REPATHA SURECLICK) 140 MG/ML SOAJ INJECT 140MG  INTO THE SKIN EVERY 14 DAYS 6 mL 3   fluticasone (FLONASE) 50 MCG/ACT nasal spray Place 1 spray into both nostrils daily. 16 g 5   fluticasone (FLOVENT HFA) 110 MCG/ACT inhaler INHALE TWO PUFFS INTO THE LUNGS TWICE DAILY 12 g 5   furosemide (LASIX) 40 MG tablet TAKE ONE TABLET (40MG  TOTAL) BY MOUTH EVERY MORNING 15 tablet 0   gabapentin (NEURONTIN) 300 MG capsule Take 300 mg by mouth daily.     gabapentin (NEURONTIN) 400 MG capsule TAKE ONE (1) CAPSULE THREE (3) TIMES EACH DAY 270 capsule 1   glucose blood test strip Use as instructed 100 each 12   insulin glargine (LANTUS SOLOSTAR) 100 UNIT/ML Solostar Pen Inject 70 Units into the skin at bedtime. 75 mL 3   isosorbide mononitrate (IMDUR) 30 MG 24 hr tablet Take 1 tablet (30 mg total) by mouth daily. 90 tablet 3   lansoprazole (PREVACID) 15 MG capsule Take 1 capsule (15 mg total) by mouth daily. 90 capsule 1   loratadine (CLARITIN) 10 MG tablet Take 10 mg by mouth daily.      metoprolol succinate (TOPROL XL) 25 MG 24 hr tablet Take 1 tablet (25 mg total) by mouth daily. 90 tablet 3   nortriptyline (PAMELOR) 50 MG capsule TAKE TWO CAPSULES BY MOUTH AT BEDTIME 180 capsule 0   valACYclovir (VALTREX) 500 MG tablet Take 500 mg by mouth 2 (two) times daily as needed (cold sore flare ups).      No current facility-administered medications for this visit.     Review of Systems  Please see the history of present illness.    (+) Shortness of breath with heavy exertion  All other systems reviewed and are otherwise negative except as noted above.  Physical Exam    Wt  Readings from Last 3 Encounters:  05/08/23 220 lb 9.6 oz (100.1 kg)  03/19/23 220 lb 3.2 oz (99.9 kg)  09/13/22 217 lb (98.4 kg)   VS: Vitals:   05/08/23 1423  BP: 118/68  Pulse: 81  SpO2: 94%  ,Body mass index is 43.08 kg/m.  Constitutional:      Appearance: Healthy appearance. Not in distress.  Neck:     Vascular: JVD normal.  Pulmonary:  Effort: Pulmonary effort is normal.     Breath sounds: No wheezing. No rales. Diminished in the bases Cardiovascular:     Normal rate. Regular rhythm. Normal S1. Normal S2.      Murmurs: There is no murmur.  Edema:    Peripheral edema absent.  Abdominal:     Palpations: Abdomen is soft non tender. There is no hepatomegaly.  Skin:    General: Skin is warm and dry.  Neurological:     General: No focal deficit present.     Mental Status: Alert and oriented to person, place and time.     Cranial Nerves: Cranial nerves are intact.  EKG/LABS/ Recent Cardiac Studies    ECG personally reviewed by me today -sinus rhythm with rate of 81 and TWI in lead I with no acute changes  Cardiac Studies & Procedures       ECHOCARDIOGRAM  ECHOCARDIOGRAM COMPLETE 05/18/2022  Narrative ECHOCARDIOGRAM REPORT    Patient Name:   MAZELL LUJAN Date of Exam: 05/18/2022 Medical Rec #:  [id number]     Height:       60.0 in Accession #:    [id number]    Weight:       222.0 lb Date of Birth:  1961/07/27    BSA:          1.951 m Patient Age:    60 years      BP:           132/81 mmHg Patient Gender: F             HR:           65 bpm. Exam Location:  [address]  Procedure: 2D Echo, Cardiac Doppler and Color Doppler  Indications:    R01.1 Murmur  History:        Patient has no prior history of Echocardiogram examinations. Risk Factors:Hypertension, Diabetes, Dyslipidemia and Family History of Coronary Artery Disease. Obesity.  Sonographer:    Cathie Beams RCS Referring Phys: [phone] MARK C SKAINS  IMPRESSIONS   1. Left ventricular ejection  fraction, by estimation, is 60 to 65%. The left ventricle has normal function. The left ventricle has no regional wall motion abnormalities. Left ventricular diastolic parameters were normal. 2. Right ventricular systolic function is normal. The right ventricular size is normal. 3. The mitral valve is normal in structure. Trivial mitral valve regurgitation. 4. The aortic valve is normal in structure. Aortic valve regurgitation is not visualized.  FINDINGS Left Ventricle: Left ventricular ejection fraction, by estimation, is 60 to 65%. The left ventricle has normal function. The left ventricle has no regional wall motion abnormalities. The left ventricular internal cavity size was normal in size. Suboptimal image quality limits for assessment of left ventricular hypertrophy. Left ventricular diastolic parameters were normal.  Right Ventricle: The right ventricular size is normal. Right vetricular wall thickness was not well visualized. Right ventricular systolic function is normal.  Left Atrium: Left atrial size was normal in size.  Right Atrium: Right atrial size was normal in size.  Pericardium: There is no evidence of pericardial effusion.  Mitral Valve: The mitral valve is normal in structure. Trivial mitral valve regurgitation.  Tricuspid Valve: The tricuspid valve is normal in structure. Tricuspid valve regurgitation is not demonstrated.  Aortic Valve: The aortic valve is normal in structure. Aortic valve regurgitation is not visualized.  Pulmonic Valve: The pulmonic valve was normal in structure. Pulmonic valve regurgitation is not visualized.  Aorta: The aortic root and ascending  aorta are structurally normal, with no evidence of dilitation.  IAS/Shunts: The atrial septum is grossly normal.   LEFT VENTRICLE PLAX 2D LVIDd:         4.50 cm   Diastology LVIDs:         2.50 cm   LV e' medial:    8.70 cm/s LV PW:         0.90 cm   LV E/e' medial:  11.8 LV IVS:        1.00 cm   LV  e' lateral:   8.60 cm/s LVOT diam:     2.00 cm   LV E/e' lateral: 12.0 LV SV:         66 LV SV Index:   34 LVOT Area:     3.14 cm   RIGHT VENTRICLE RV Basal diam:  2.80 cm RV S prime:     8.76 cm/s TAPSE (M-mode): 2.4 cm  LEFT ATRIUM           Index        RIGHT ATRIUM           Index LA diam:      4.10 cm 2.10 cm/m   RA Area:     11.30 cm LA Vol (A2C): 45.7 ml 23.42 ml/m  RA Volume:   24.40 ml  12.50 ml/m LA Vol (A4C): 44.3 ml 22.70 ml/m AORTIC VALVE LVOT Vmax:   86.20 cm/s LVOT Vmean:  63.400 cm/s LVOT VTI:    0.209 m  AORTA Ao Root diam: 3.00 cm Ao Asc diam:  2.90 cm  MITRAL VALVE MV Area (PHT): 3.30 cm     SHUNTS MV Decel Time: 230 msec     Systemic VTI:  0.21 m MV E velocity: 103.00 cm/s  Systemic Diam: 2.00 cm MV A velocity: 87.00 cm/s MV E/A ratio:  1.18  Kristeen Miss MD Electronically signed by Kristeen Miss MD Signature Date/Time: 05/18/2022/3:53:48 PM    Final     CT SCANS  CT CORONARY MORPH W/CTA COR W/SCORE 05/18/2022  Addendum 05/18/2022 12:45 PM ADDENDUM REPORT: 05/18/2022 12:42  HISTORY: Chest pain, nonspecific  EXAM: Cardiac/Coronary  CT  TECHNIQUE: The patient was scanned on a [employer].  PROTOCOL: A 120 kV prospective scan was triggered in the descending thoracic aorta at 111 HU's. Axial non-contrast 3 mm slices were carried out through the heart. The data set was analyzed on a dedicated work station and scored using the Agatston method. Gantry rotation speed was 250 msecs and collimation was .6 mm. Beta blockade and 0.8 mg of sl NTG was given. The 3D data set was reconstructed in 5% intervals of the 35-75 % of the R-R cycle. Systolic and diastolic phases were analyzed on a dedicated work station using MPR, MIP and VRT modes. The patient received contrast: OMNIPAQUE IOHEXOL 350 MG/ML SOLN.  FINDINGS: Image quality: Good  Noise artifact is: Limited  Coronary calcium score is 13, which places the patient  in the 68th percentile for age and sex matched control.  Coronary arteries: Normal coronary origins.  Right dominance.  Right Coronary Artery: Moderate mixed atherosclerotic plaque in the proximal RCA, 50-69% stenosis. Minimal atherosclerotic plaque in the proximal PDA, <25% stenosis.  Left Main Coronary Artery: No detectable plaque or stenosis.  Left Anterior Descending Coronary Artery: Possible severe stenosis in the mid first diagonal branch, 70-99% stenosis, proximal reference vessel is 2.5 mm. Minimal atherosclerotic plaque in the mid LAD, <25% stenosis.  Left Circumflex Artery: Mild  atherosclerotic plaque in the proximal LCx, 25-49% stenosis.  Aorta: Normal size, 30 mm at the mid ascending aorta (level of the PA bifurcation) measured double oblique. Mild calcifications. No dissection.  Aortic Valve: Mild calcifications. AV calcium score 53.  Other findings:  Normal pulmonary vein drainage into the left atrium.  Normal left atrial appendage without thrombus.  Normal size of the pulmonary artery.  IMPRESSION: 1. Severe CAD, 70-99% stenosis, CADRADS 4. Moderate CAD in proximal RCA. CT FFR will be performed and reported separately.  2. Coronary calcium score is 13, which places the patient in the 68th percentile for age and sex matched control.  3. Normal coronary origins with right dominance.  4.  Mild aortic valve calcifications, AV calcium score 53.  RECOMMENDATIONS: CAD-RADS 4 Severe stenosis. (70-99% or > 50% left main). Cardiac catheterization or CT FFR is recommended. Consider symptom-guided anti-ischemic pharmacotherapy as well as risk factor modification per guideline directed care.   Electronically Signed By: Weston Brass M.D. On: 05/18/2022 12:42  Narrative EXAM: OVER-READ INTERPRETATION  CT CHEST  The following report is a limited chest CT over-read performed by radiologist Dr. Ulyses Southward of [facility] on 05/17/2022. This  over-read does not include interpretation of cardiac or coronary anatomy or pathology. The coronary CTA interpretation by the cardiologist is attached.  COMPARISON:  None Available.  FINDINGS: Vascular: No pericardial effusion. Aorta normal caliber. Pulmonary arteries patent.  Mediastinum/Nodes: Small hiatal hernia.  No thoracic adenopathy.  Lungs/Pleura: Minimal dependent density in the posterior lower lobes. Lungs otherwise clear. No infiltrate, pleural effusion, or pneumothorax.  Upper Abdomen: Unremarkable  Musculoskeletal: Unremarkable  IMPRESSION: Small hiatal hernia.  No additional non cardiac abnormalities.  Electronically Signed: By: Ulyses Southward M.D. On: 05/17/2022 16:02           Lab Results  Component Value Date   WBC 4.6 07/07/2021   HGB 12.2 07/07/2021   HCT 36.6 07/07/2021   MCV 87.1 07/07/2021   PLT 238 07/07/2021   Lab Results  Component Value Date   CREATININE 0.8 03/30/2022   BUN 13 03/30/2022   NA 142 03/30/2022   K 3.8 03/30/2022   CL 103 03/30/2022   CO2 27 (A) 03/30/2022   Lab Results  Component Value Date   ALT 21 03/30/2022   AST 22 03/30/2022   ALKPHOS 119 03/30/2022   BILITOT 0.4 07/07/2021   Lab Results  Component Value Date   CHOL 93 03/30/2022   HDL 50 03/30/2022   LDLCALC 22 02/09/2023   TRIG 155 02/09/2023   CHOLHDL 1.9 07/07/2021    Lab Results  Component Value Date   HGBA1C 7.1 02/09/2023     Assessment & Plan    1. preoperative clearance: -  RCRI score is 6.6% -The patient affirms she has been doing well without any new cardiac symptoms. They are able to achieve 5 METS without cardiac limitations. Therefore, based on ACC/AHA guidelines, the patient would be at acceptable risk for the planned procedure without further cardiovascular testing. The patient was advised that if she develops new symptoms prior to surgery to contact our office to arrange for a follow-up visit, and she verbalized understanding.   -Patient can hold ASA 81 mg 7 days before procedure and should restart postprocedure when surgically safe.  2. Coronary artery disease: -Coronary CT with calcium score 13 and 50 to 69% proximal RCA stenosis as well as severe stenosis in mid first diagonal branch of 70 to 99% by FFR -Today patient reports  no chest pain or anginal equivalent since previous follow-up. -Continue GDMT with ASA 81 mg, Repatha 140 mg per meal q. 14 days, Toprol-XL 25 mg and Imdur 30 mg daily  3. Essential hypertension: -Patient blood pressure today was controlled at 118/68 -Continue Toprol 25 mg daily  4. Hyperlipidemia: -LDL cholesterol was 22 and total cholesterol 98 -Continue Repatha 140 mg q. 14 days as noted above  Disposition: Follow-up with Donato Schultz, MD or [facility] in 12 months    Medication Adjustments/Labs and Tests Ordered: Current medicines are reviewed at length with the patient today.  Concerns regarding medicines are outlined above.   Signed, Napoleon Form, Leodis Rains, NP 05/08/2023, 2:31 PM [facility]

## 2023-05-08 ENCOUNTER — Ambulatory Visit: Payer: BC Managed Care – PPO | Attending: Nurse Practitioner | Admitting: Nurse Practitioner

## 2023-05-08 ENCOUNTER — Encounter: Payer: Self-pay | Admitting: Nurse Practitioner

## 2023-05-08 VITALS — BP 118/68 | HR 81 | Ht 60.0 in | Wt 220.6 lb

## 2023-05-08 DIAGNOSIS — I10 Essential (primary) hypertension: Secondary | ICD-10-CM

## 2023-05-08 DIAGNOSIS — Z01810 Encounter for preprocedural cardiovascular examination: Secondary | ICD-10-CM | POA: Diagnosis not present

## 2023-05-08 DIAGNOSIS — I2510 Atherosclerotic heart disease of native coronary artery without angina pectoris: Secondary | ICD-10-CM | POA: Diagnosis not present

## 2023-05-08 DIAGNOSIS — E782 Mixed hyperlipidemia: Secondary | ICD-10-CM | POA: Diagnosis not present

## 2023-05-08 MED ORDER — ISOSORBIDE MONONITRATE ER 30 MG PO TB24: 30.0000 mg | ORAL_TABLET | Freq: Every day | ORAL | 3 refills | Status: DC

## 2023-05-08 MED ORDER — METOPROLOL SUCCINATE ER 25 MG PO TB24: 25.0000 mg | ORAL_TABLET | Freq: Every day | ORAL | 3 refills | Status: AC

## 2023-05-08 NOTE — Patient Instructions (Signed)
Medication Instructions:  Your physician recommends that you continue on your current medications as directed. Please refer to the Current Medication list given to you today. *If you need a refill on your cardiac medications before your next appointment, please call your pharmacy*   Lab Work: None ordered If you have labs (blood work) drawn today and your tests are completely normal, you will receive your results only by: MyChart Message (if you have MyChart) OR A paper copy in the mail If you have any lab test that is abnormal or we need to change your treatment, we will call you to review the results.   Testing/Procedures: None ordered   Follow-Up: At [facility], you and your health needs are our priority.  As part of our continuing mission to provide you with exceptional heart care, we have created designated Provider Care Teams.  These Care Teams include your primary Cardiologist (physician) and Advanced Practice Providers (APPs -  Physician Assistants and Nurse Practitioners) who all work together to provide you with the care you need, when you need it.  We recommend signing up for the patient portal called "MyChart".  Sign up information is provided on this After Visit Summary.  MyChart is used to connect with patients for Virtual Visits (Telemedicine).  Patients are able to view lab/test results, encounter notes, upcoming appointments, etc.  Non-urgent messages can be sent to your provider as well.   To learn more about what you can do with MyChart, go to [email or web address].    Your next appointment:   12 month(s)  Provider:   Donato Schultz, MD     Other Instructions YOU ARE CLEARED FOR YOUR PROCEDURE

## 2023-05-10 ENCOUNTER — Other Ambulatory Visit: Payer: Self-pay | Admitting: Neurosurgery

## 2023-05-14 NOTE — Progress Notes (Signed)
Surgical Instructions    Your procedure is scheduled on May 16, 2023.  Report to [facility] Main Entrance "A" at 11:35 A.M., then check in with the Admitting office.  Call this number if you have problems the morning of surgery:  [phone]  If you have any questions prior to your surgery date call [phone]: Open Monday-Friday 8am-4pm If you experience any cold or flu symptoms such as cough, fever, chills, shortness of breath, etc. between now and your scheduled surgery, please notify us at the above number.     Remember:  Do not eat or drink  after midnight the night before your surgery  Take these medicines the morning of surgery with A SIP OF WATER  albuterol (PROAIR HFA) 108 (90 Base) MCG/ACT inhaler  cyclobenzaprine (FLEXERIL)  fluticasone (FLONASE)  fluticasone (FLOVENT HFA) 110 MCG/ACT inhaler    gabapentin (NEURONTIN)     valACYclovir (VALTREX)        isosorbide mononitrate  lansoprazole (PREVACID)  loratadine (CLARITIN)   metoprolol succinate (TOPROL XL)              As of today, STOP taking any Aleve, Naproxen, Ibuprofen, Motrin, Advil, Goody's, BC's, all herbal medications, fish oil, and all vitamins this includes your medication meloxicam MOBIC   WHAT DO I DO ABOUT MY DIABETES MEDICATION?   Stop taking your Dulaglutide (TRULICITY)  one week prior to surgery  THE NIGHT BEFORE SURGERY, take 35 units of insulin glargine (LANTUS SOLOSTAR)         HOW TO MANAGE YOUR DIABETES BEFORE AND AFTER SURGERY  Why is it important to control my blood sugar before and after surgery? Improving blood sugar levels before and after surgery helps healing and can limit problems. A way of improving blood sugar control is eating a healthy diet by:  Eating less sugar and carbohydrates  Increasing activity/exercise  Talking with your doctor about reaching your blood sugar goals High blood sugars (greater than 180 mg/dL) can raise your risk of infections and slow your  recovery, so you will need to focus on controlling your diabetes during the weeks before surgery. Make sure that the doctor who takes care of your diabetes knows about your planned surgery including the date and location.  How do I manage my blood sugar before surgery? Check your blood sugar at least 4 times a day, starting 2 days before surgery, to make sure that the level is not too high or low.  Check your blood sugar the morning of your surgery when you wake up and every 2 hours until you get to the Short Stay unit.  If your blood sugar is less than 70 mg/dL, you will need to treat for low blood sugar: Do not take insulin. Treat a low blood sugar (less than 70 mg/dL) with  cup of clear juice (cranberry or apple), 4 glucose tablets, OR glucose gel. Recheck blood sugar in 15 minutes after treatment (to make sure it is greater than 70 mg/dL). If your blood sugar is not greater than 70 mg/dL on recheck, call [phone] for further instructions. Report your blood sugar to the short stay nurse when you get to Short Stay.  If you are admitted to the hospital after surgery: Your blood sugar will be checked by the staff and you will probably be given insulin after surgery (instead of oral diabetes medicines) to make sure you have good blood sugar levels. The goal for blood sugar control after surgery is 80-180 mg/dL.   Do  NOT Smoke (Tobacco/Vaping) for 24 hours prior to your procedure.  If you use a CPAP at night, you may bring your mask/headgear for your overnight stay.   Contacts, glasses, piercing's, hearing aid's, dentures or partials may not be worn into surgery, please bring cases for these belongings.    For patients admitted to the hospital, discharge time will be determined by your treatment team.   Patients discharged the day of surgery will not be allowed to drive home, and someone needs to stay with them for 24 hours.  SURGICAL WAITING ROOM VISITATION Patients having surgery or  a procedure may have no more than 2 support people in the waiting area - these visitors may rotate.   Children under the age of 31 must have an adult with them who is not the patient. If the patient needs to stay at the hospital during part of their recovery, the visitor guidelines for inpatient rooms apply. Pre-op nurse will coordinate an appropriate time for 1 support person to accompany patient in pre-op.  This support person may not rotate.   Please refer to the [facility] website for the visitor guidelines for Inpatients (after your surgery is over and you are in a regular room).    If you received a COVID test during your pre-op visit  it is requested that you wear a mask when out in public, stay away from anyone that may not be feeling well and notify your surgeon if you develop symptoms. If you have been in contact with anyone that has tested positive in the last 10 days please notify you surgeon.     Pre-operative 5 CHG Bath Instructions   You can play a key role in reducing the risk of infection after surgery. Your skin needs to be as free of germs as possible. You can reduce the number of germs on your skin by washing with CHG (chlorhexidine gluconate) soap before surgery. CHG is an antiseptic soap that kills germs and continues to kill germs even after washing.   DO NOT use if you have an allergy to chlorhexidine/CHG or antibacterial soaps. If your skin becomes reddened or irritated, stop using the CHG and notify one of our RNs at [phone].   Please shower with the CHG soap starting 4 days before surgery using the following schedule:     Please keep in mind the following:  DO NOT shave, including legs and underarms, starting the day of your first shower.   You may shave your face at any point before/day of surgery.  Place clean sheets on your bed the day you start using CHG soap. Use a clean washcloth (not used since being washed) for each shower. DO NOT sleep with pets  once you start using the CHG.   CHG Shower Instructions:  If you choose to wash your hair and private area, wash first with your normal shampoo/soap.  After you use shampoo/soap, rinse your hair and body thoroughly to remove shampoo/soap residue.  Turn the water OFF and apply about 3 tablespoons (45 ml) of CHG soap to a CLEAN washcloth.  Apply CHG soap ONLY FROM YOUR NECK DOWN TO YOUR TOES (washing for 3-5 minutes)  DO NOT use CHG soap on face, private areas, open wounds, or sores.  Pay special attention to the area where your surgery is being performed.  If you are having back surgery, having someone wash your back for you may be helpful. Wait 2 minutes after CHG soap is applied, then you may  rinse off the CHG soap.  Pat dry with a clean towel  Put on clean clothes/pajamas   If you choose to wear lotion, please use ONLY the CHG-compatible lotions on the back of this paper.     Additional instructions for the day of surgery: DO NOT APPLY any lotions, deodorants, cologne, or perfumes.  Do not wear jewelry or makeup  Do not wear nail polish, gel polish, artificial nails, or any other type of covering on natural nails (fingers and toes) If you have artificial nails or gel coating that need to be removed by a nail salon, please have this removed prior to surgery. Artificial nails or gel coating may interfere with anesthesia's ability to adequately monitor your vital signs Do not bring valuables to the hospital. [facility] is not responsible for any belongings or valuables. Put on clean/comfortable clothes.  Brush your teeth.  Ask your nurse before applying any prescription medications to the skin.      CHG Compatible Lotions   Aveeno Moisturizing lotion  Cetaphil Moisturizing Cream  Cetaphil Moisturizing Lotion  Clairol Herbal Essence Moisturizing Lotion, Dry Skin  Clairol Herbal Essence Moisturizing Lotion, Extra Dry Skin  Clairol Herbal Essence Moisturizing Lotion, Normal Skin   Curel Age Defying Therapeutic Moisturizing Lotion with Alpha Hydroxy  Curel Extreme Care Body Lotion  Curel Soothing Hands Moisturizing Hand Lotion  Curel Therapeutic Moisturizing Cream, Fragrance-Free  Curel Therapeutic Moisturizing Lotion, Fragrance-Free  Curel Therapeutic Moisturizing Lotion, Original Formula  Eucerin Daily Replenishing Lotion  Eucerin Dry Skin Therapy Plus Alpha Hydroxy Crme  Eucerin Dry Skin Therapy Plus Alpha Hydroxy Lotion  Eucerin Original Crme  Eucerin Original Lotion  Eucerin Plus Crme Eucerin Plus Lotion  Eucerin TriLipid Replenishing Lotion  Keri Anti-Bacterial Hand Lotion  Keri Deep Conditioning Original Lotion Dry Skin Formula Softly Scented  Keri Deep Conditioning Original Lotion, Fragrance Free Sensitive Skin Formula  Keri Lotion Fast Absorbing Fragrance Free Sensitive Skin Formula  Keri Lotion Fast Absorbing Softly Scented Dry Skin Formula  Keri Original Lotion  Keri Skin Renewal Lotion Keri Silky Smooth Lotion  Keri Silky Smooth Sensitive Skin Lotion  Nivea Body Creamy Conditioning Oil  Nivea Body Extra Enriched Lotion  Nivea Body Original Lotion  Nivea Body Sheer Moisturizing Lotion Nivea Crme  Nivea Skin Firming Lotion  NutraDerm 30 Skin Lotion  NutraDerm Skin Lotion  NutraDerm Therapeutic Skin Cream  NutraDerm Therapeutic Skin Lotion  ProShield Protective Hand Cream  Provon moisturizing lotion       Please read over the following fact sheets that you were given.

## 2023-05-15 ENCOUNTER — Encounter (HOSPITAL_COMMUNITY): Payer: Self-pay

## 2023-05-15 ENCOUNTER — Encounter (HOSPITAL_COMMUNITY)
Admission: RE | Admit: 2023-05-15 | Discharge: 2023-05-15 | Disposition: A | Discharge status: Home / Self Care | Payer: BC Managed Care – PPO | Source: Ambulatory Visit | Attending: Neurosurgery | Admitting: Neurosurgery

## 2023-05-15 ENCOUNTER — Other Ambulatory Visit: Payer: Self-pay

## 2023-05-15 VITALS — BP 158/71 | HR 70 | Temp 98.2°F | Resp 19 | Ht 60.0 in | Wt 222.0 lb

## 2023-05-15 DIAGNOSIS — Z7951 Long term (current) use of inhaled steroids: Secondary | ICD-10-CM | POA: Insufficient documentation

## 2023-05-15 DIAGNOSIS — I10 Essential (primary) hypertension: Secondary | ICD-10-CM | POA: Insufficient documentation

## 2023-05-15 DIAGNOSIS — Z01818 Encounter for other preprocedural examination: Secondary | ICD-10-CM | POA: Insufficient documentation

## 2023-05-15 DIAGNOSIS — I2510 Atherosclerotic heart disease of native coronary artery without angina pectoris: Secondary | ICD-10-CM | POA: Insufficient documentation

## 2023-05-15 DIAGNOSIS — M48061 Spinal stenosis, lumbar region without neurogenic claudication: Secondary | ICD-10-CM | POA: Insufficient documentation

## 2023-05-15 DIAGNOSIS — E119 Type 2 diabetes mellitus without complications: Secondary | ICD-10-CM | POA: Insufficient documentation

## 2023-05-15 DIAGNOSIS — Z87891 Personal history of nicotine dependence: Secondary | ICD-10-CM | POA: Diagnosis not present

## 2023-05-15 DIAGNOSIS — G4733 Obstructive sleep apnea (adult) (pediatric): Secondary | ICD-10-CM | POA: Insufficient documentation

## 2023-05-15 DIAGNOSIS — M47819 Spondylosis without myelopathy or radiculopathy, site unspecified: Secondary | ICD-10-CM | POA: Insufficient documentation

## 2023-05-15 DIAGNOSIS — R9431 Abnormal electrocardiogram [ECG] [EKG]: Secondary | ICD-10-CM | POA: Insufficient documentation

## 2023-05-15 HISTORY — DX: Anemia, unspecified: D64.9

## 2023-05-15 HISTORY — DX: Personal history of other diseases of the digestive system: Z87.19

## 2023-05-15 HISTORY — DX: Unspecified osteoarthritis, unspecified site: M19.90

## 2023-05-15 LAB — HEMOGLOBIN A1C
Hgb A1c MFr Bld: 7.3 % — ABNORMAL HIGH (ref 4.8–5.6)
Mean Plasma Glucose: 162.81 mg/dL

## 2023-05-15 LAB — BASIC METABOLIC PANEL
Anion gap: 7 (ref 5–15)
BUN: 12 mg/dL (ref 8–23)
CO2: 27 mmol/L (ref 22–32)
Calcium: 9.2 mg/dL (ref 8.9–10.3)
Chloride: 104 mmol/L (ref 98–111)
Creatinine, Ser: 0.86 mg/dL (ref 0.44–1.00)
GFR, Estimated: >60 mL/min (ref >60)
Glucose, Bld: 99 mg/dL (ref 70–99)
Potassium: 3.5 mmol/L (ref 3.5–5.1)
Sodium: 138 mmol/L (ref 135–145)

## 2023-05-15 LAB — CBC
HCT: 35.5 % — ABNORMAL LOW (ref 36.0–46.0)
Hemoglobin: 11.5 g/dL — ABNORMAL LOW (ref 12.0–15.0)
MCH: 29 pg (ref 26.0–34.0)
MCHC: 32.4 g/dL (ref 30.0–36.0)
MCV: 89.6 fL (ref 80.0–100.0)
Platelets: 228 10*3/uL (ref 150–400)
RBC: 3.96 MIL/uL (ref 3.87–5.11)
RDW: 13.7 % (ref 11.5–15.5)
WBC: 4.8 10*3/uL (ref 4.0–10.5)
nRBC: 0 % (ref 0.0–0.2)

## 2023-05-15 LAB — SURGICAL PCR SCREEN
MRSA, PCR: NEGATIVE
Staphylococcus aureus: NEGATIVE

## 2023-05-15 LAB — GLUCOSE, CAPILLARY: Glucose-Capillary: 112 mg/dL — ABNORMAL HIGH (ref 70–99)

## 2023-05-15 NOTE — Progress Notes (Signed)
Surgical Instructions    Your procedure is scheduled on May 16, 2023.  Report to [facility] Main Entrance "A" at 11:35 A.M., then check in with the Admitting office.  Call this number if you have problems the morning of surgery:  [phone]  If you have any questions prior to your surgery date call [phone]: Open Monday-Friday 8am-4pm If you experience any cold or flu symptoms such as cough, fever, chills, shortness of breath, etc. between now and your scheduled surgery, please notify us at the above number.     Remember:  Do not eat or drink  after midnight the night before your surgery  Take these medicines the morning of surgery with A SIP OF WATER  albuterol (PROAIR HFA) 108 (90 Base) MCG/ACT inhaler  cyclobenzaprine (FLEXERIL)  fluticasone (FLONASE)  fluticasone (FLOVENT HFA) 110 MCG/ACT inhaler    gabapentin (NEURONTIN)    May take IF NEEDED:  valACYclovir (VALTREX)        isosorbide mononitrate  lansoprazole (PREVACID)  loratadine (CLARITIN)   metoprolol succinate (TOPROL XL)            As of today, STOP taking any Aleve, Naproxen, Ibuprofen, Motrin, Advil, Goody's, BC's, all herbal medications, fish oil, and all vitamins this includes your medication meloxicam MOBIC    WHAT DO I DO ABOUT MY DIABETES MEDICATION?   Stop taking your Dulaglutide (TRULICITY)  one week prior to surgery  THE NIGHT BEFORE SURGERY, take 35 units of insulin glargine (LANTUS SOLOSTAR)       HOW TO MANAGE YOUR DIABETES BEFORE AND AFTER SURGERY  Why is it important to control my blood sugar before and after surgery? Improving blood sugar levels before and after surgery helps healing and can limit problems. A way of improving blood sugar control is eating a healthy diet by:  Eating less sugar and carbohydrates  Increasing activity/exercise  Talking with your doctor about reaching your blood sugar goals High blood sugars (greater than 180 mg/dL) can raise your risk of infections and  slow your recovery, so you will need to focus on controlling your diabetes during the weeks before surgery. Make sure that the doctor who takes care of your diabetes knows about your planned surgery including the date and location.  How do I manage my blood sugar before surgery? Check your blood sugar at least 4 times a day, starting 2 days before surgery, to make sure that the level is not too high or low.  Check your blood sugar the morning of your surgery when you wake up and every 2 hours until you get to the Short Stay unit.  If your blood sugar is less than 70 mg/dL, you will need to treat for low blood sugar: Do not take insulin. Treat a low blood sugar (less than 70 mg/dL) with  cup of clear juice (cranberry or apple), 4 glucose tablets, OR glucose gel. Recheck blood sugar in 15 minutes after treatment (to make sure it is greater than 70 mg/dL). If your blood sugar is not greater than 70 mg/dL on recheck, call [phone] for further instructions. Report your blood sugar to the short stay nurse when you get to Short Stay.  If you are admitted to the hospital after surgery: Your blood sugar will be checked by the staff and you will probably be given insulin after surgery (instead of oral diabetes medicines) to make sure you have good blood sugar levels. The goal for blood sugar control after surgery is 80-180 mg/dL.  Do NOT Smoke (Tobacco/Vaping) for 24 hours prior to your procedure.  If you use a CPAP at night, you may bring your mask/headgear for your overnight stay.   Contacts, glasses, piercing's, hearing aid's, dentures or partials may not be worn into surgery, please bring cases for these belongings.    For patients admitted to the hospital, discharge time will be determined by your treatment team.   Patients discharged the day of surgery will not be allowed to drive home, and someone needs to stay with them for 24 hours.  SURGICAL WAITING ROOM VISITATION Patients having  surgery or a procedure may have no more than 2 support people in the waiting area - these visitors may rotate.   Children under the age of 57 must have an adult with them who is not the patient. If the patient needs to stay at the hospital during part of their recovery, the visitor guidelines for inpatient rooms apply. Pre-op nurse will coordinate an appropriate time for 1 support person to accompany patient in pre-op.  This support person may not rotate.   Please refer to the [facility] website for the visitor guidelines for Inpatients (after your surgery is over and you are in a regular room).    [place]- Preparing For Surgery  Before surgery, you can play an important role. Because skin is not sterile, your skin needs to be as free of germs as possible. You can reduce the number of germs on your skin by washing with CHG (chlorahexidine gluconate) Soap before surgery.  CHG is an antiseptic cleaner which kills germs and bonds with the skin to continue killing germs even after washing.    Oral Hygiene is also important to reduce your risk of infection.  Remember - BRUSH YOUR TEETH THE MORNING OF SURGERY WITH YOUR REGULAR TOOTHPASTE  Please do not use if you have an allergy to CHG or antibacterial soaps. If your skin becomes reddened/irritated stop using the CHG.  Do not shave (including legs and underarms) for at least 48 hours prior to first CHG shower. It is OK to shave your face.  Please follow these instructions carefully.   Shower the NIGHT BEFORE SURGERY and the MORNING OF SURGERY  If you chose to wash your hair, wash your hair first as usual with your normal shampoo.  After you shampoo, rinse your hair and body thoroughly to remove the shampoo.  Use CHG Soap as you would any other liquid soap. You can apply CHG directly to the skin and wash gently with a scrungie or a clean washcloth.   Apply the CHG Soap to your body ONLY FROM THE NECK DOWN.  Do not use on open wounds or open  sores. Avoid contact with your eyes, ears, mouth and genitals (private parts). Wash Face and genitals (private parts)  with your normal soap.   Wash thoroughly, paying special attention to the area where your surgery will be performed.  Thoroughly rinse your body with warm water from the neck down.  DO NOT shower/wash with your normal soap after using and rinsing off the CHG Soap.  Pat yourself dry with a CLEAN TOWEL.  Wear CLEAN PAJAMAS to bed the night before surgery  Place CLEAN SHEETS on your bed the night before your surgery  DO NOT SLEEP WITH PETS.   Day of Surgery: Take a shower with CHG soap. Do not wear jewelry or makeup Do not wear lotions, powders, perfumes/colognes, or deodorant. Do not shave 48 hours prior to surgery.  Men may shave face and neck. Do not bring valuables to the hospital.  [facility] is not responsible for any belongings or valuables. Do not wear nail polish, gel polish, artificial nails, or any other type of covering on natural nails (fingers and toes) If you have artificial nails or gel coating that need to be removed by a nail salon, please have this removed prior to surgery. Artificial nails or gel coating may interfere with anesthesia's ability to adequately monitor your vital signs.  Wear Clean/Comfortable clothing the morning of surgery Remember to brush your teeth WITH YOUR REGULAR TOOTHPASTE.   Please read over the following fact sheets that you were given.    If you received a COVID test during your pre-op visit  it is requested that you wear a mask when out in public, stay away from anyone that may not be feeling well and notify your surgeon if you develop symptoms. If you have been in contact with anyone that has tested positive in the last 10 days please notify you surgeon.

## 2023-05-15 NOTE — Progress Notes (Addendum)
PCP - Dr. Kathleene Hazel. [place] Cardiologist - Dr. Donato Schultz  PPM/ICD - Denies Device Orders - n/a Rep Notified - n/a  Chest x-ray - Denies EKG - 05/08/2023 Stress Test - Denies ECHO - 05/18/2022 Cardiac Cath - Denies  Sleep Study - +OSA. Wears CPAP nightly. Auto pressure.  Pt is DM2. She checks her blood sugar every morning. Normal fasting range is around 120. CBG at pre-op appointment 112. She has not had anything to eat or drink today. A1c result pending  Last dose of GLP1 agonist- Last dose of Trulicity June 12th GLP1 instructions: Pt will continue to hold medication until after surgery  Blood Thinner Instructions: n/a Aspirin Instructions: Pts last dose of ASA was June 13th. She will continue to hold until after surgery  NPO after midnight  COVID TEST- n/a   Anesthesia review: Yes. Cardiac Clearance. [facility] but last minute [facility] got changed to [facility].  Patient denies shortness of breath, fever, cough and chest pain at PAT appointment. Pt denies any respiratory illness/infection in the last two months.   All instructions explained to the patient, with a verbal understanding of the material. Patient agrees to go over the instructions while at home for a better understanding. Patient also instructed to self quarantine after being tested for COVID-19. The opportunity to ask questions was provided.

## 2023-05-15 NOTE — Progress Notes (Signed)
Choose an anesthesia record to view details        DISCUSSION: Lisa Burns is a 62 yo female who presents to PAT prior to L3-L4 Laminectomy with Dr. Hoyt Koch on 05/16/23. PMH significant for former smoking (quit 2004), HTN, CAD, aortic atherosclerosis, heart murmur, asthma, GERD, diabetes c/b neuropathy, OSA, hiatal hernia, anemia.  No prior anesthesia complications.  Patient was referred to Cardiology in 04/2022 for evaluation of a heart murmur. Patient had 2D echo completed showing EF of 60 to 65% with no RWMA and trivial MVR.  Patient completed a coronary CTA that showed calcium score of 13 with 50-69% gnosis of the RCA and 70-99% stenosis of the LAD with mild athero-atherosclerotic plaque in the proximal left circumflex 25- 49% and mild calcifications in the aorta. She last followed up in clinic on 05/08/23 for pre-op risk assessment and clearance. Denied any cardiac symptoms. Advised to f/u in 1 year. Clearance provided:  "RCRI score is 6.6% -The patient affirms she has been doing well without any new cardiac symptoms. They are able to achieve 5 METS without cardiac limitations. Therefore, based on ACC/AHA guidelines, the patient would be at acceptable risk for the planned procedure without further cardiovascular testing. The patient was advised that if she develops new symptoms prior to surgery to contact our office to arrange for a follow-up visit, and she verbalized understanding.  -Patient can hold ASA 81 mg 7 days before procedure and should restart postprocedure when surgically safe."  Patient follows with endocrinology for T2DM. Last A1c on 02/09/23 was 7.1.   VS: BP (!) 158/71   Pulse 70   Temp 36.8 C   Resp 19   Ht 5' (1.524 m)   Wt 100.7 kg   SpO2 97%   BMI 43.36 kg/m   PROVIDERS: Benita Stabile, MD Cardiology: Donato Schultz, MD  LABS: Labs reviewed: Acceptable for surgery. (all labs ordered are listed, but only abnormal results are displayed)  Labs Reviewed   GLUCOSE, CAPILLARY - Abnormal; Notable for the following components:      Result Value   Glucose-Capillary 112 (*)    All other components within normal limits  SURGICAL PCR SCREEN  HEMOGLOBIN A1C  BASIC METABOLIC PANEL  CBC     IMAGES:  MR Lumbar w/o contrast 09/22/22:  IMPRESSION: 1. 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 2. Moderate left and mild right neural foraminal stenosis at L3-L4 with possible displacement of the exiting L3 nerve roots.     EKG 05/08/23  NSR, rate 81 T wave inversion in lead I   CV:  Echo 05/18/22:  IMPRESSIONS     1. Left ventricular ejection fraction, by estimation, is 60 to 65%. The  left ventricle has normal function. The left ventricle has no regional  wall motion abnormalities. Left ventricular diastolic parameters were  normal.   2. Right ventricular systolic function is normal. The right ventricular  size is normal.   3. The mitral valve is normal in structure. Trivial mitral valve  regurgitation.   4. The aortic valve is normal in structure. Aortic valve regurgitation is  not visualized.   CT FFR 05/19/23:  IMPRESSION: 1. CT FFR analysis showed high likelihood of hemodynamic significance of first diagonal branch stenosis, FFR 0.71 and significant delta. Reference vessel is 2.5 mm.   RECOMMENDATIONS: Guideline-directed  medical therapy and aggressive risk factor modification for secondary prevention of coronary artery disease. Consider coronary angiography if clinically indicated.  CT Coronary 05/18/23:  IMPRESSION: 1. Severe CAD, 70-99% stenosis, CADRADS 4. Moderate CAD in proximal RCA. CT FFR will be performed and reported separately.   2. Coronary calcium score is 13, which places the patient in the 68th percentile  for age and sex matched control.   3. Normal coronary origins with right dominance.   4.  Mild aortic valve calcifications, AV calcium score 53.   RECOMMENDATIONS: CAD-RADS 4 Severe stenosis. (70-99% or > 50% left main). Cardiac catheterization or CT FFR is recommended. Consider symptom-guided anti-ischemic pharmacotherapy as well as risk factor modification per guideline directed care.  Past Medical History:  Diagnosis Date   Acute pharyngitis    Allergy    Anemia    Arthritis    Asthma    Constipation    Cough    Diabetes mellitus with neuropathy (HCC)    Diabetes mellitus without complication (HCC)    GERD (gastroesophageal reflux disease)    Heart murmur    Heart murmur    History of hiatal hernia    Sx in 2000   Hyperlipidemia    Hypertension    Microproteinuria    Neuropathy    Obesity    OSA (obstructive sleep apnea)    Proteinuria    Reflux    Snoring    Ulcer of mouth    URI (upper respiratory infection)    Venous stasis     Past Surgical History:  Procedure Laterality Date   CESAREAN SECTION  1986   COLONOSCOPY N/A 04/24/2016   Procedure: COLONOSCOPY;  Surgeon: West Bali, MD;  [facility]: [facility];  Service: Endoscopy;  Laterality: N/A;  8:30 Am   COLONOSCOPY N/A 07/18/2019   Dr. Darrick Penna: 6 simple adenomas removed.  External/internal hemorrhoids.  Next colonoscopy in 3 years.   LAPAROSCOPIC NISSEN FUNDOPLICATION  2000   OTHER SURGICAL HISTORY     gerd surgery   POLYPECTOMY  07/18/2019   Procedure: POLYPECTOMY;  Surgeon: West Bali, MD;  Location: [facility];  Service: Endoscopy;;   THROAT SURGERY  2000   TONSILLECTOMY     As a child    MEDICATIONS:  albuterol (PROAIR HFA) 108 (90 Base) MCG/ACT inhaler   aspirin EC 81 MG tablet   Azelaic Acid 15 % cream   clobetasol (TEMOVATE) 0.05 % external solution   cyclobenzaprine (FLEXERIL) 10 MG tablet   Dulaglutide (TRULICITY) 4.5 MG/0.5ML SOPN   enalapril (VASOTEC) 20 MG tablet    Evolocumab (REPATHA SURECLICK) 140 MG/ML SOAJ   fluticasone (FLONASE) 50 MCG/ACT nasal spray   fluticasone (FLOVENT HFA) 110 MCG/ACT inhaler   furosemide (LASIX) 40 MG tablet   gabapentin (NEURONTIN) 400 MG capsule   glucose blood test strip   insulin glargine (LANTUS SOLOSTAR) 100 UNIT/ML Solostar Pen   isosorbide mononitrate (IMDUR) 30 MG 24 hr tablet   lansoprazole (PREVACID) 15 MG capsule   loratadine (CLARITIN) 10 MG tablet   meloxicam (MOBIC) 15 MG tablet   metoprolol succinate (TOPROL XL) 25 MG 24 hr tablet   NON FORMULARY   nortriptyline (PAMELOR) 50 MG capsule   valACYclovir (VALTREX) 500 MG tablet   No current facility-administered medications for this encounter.   Marcille Blanco [facility] Surgical Short Stay/Anesthesiology [facility] Phone [phone] 05/15/2023 9:19 AM

## 2023-05-15 NOTE — Anesthesia Preprocedure Evaluation (Signed)
Anesthesia Evaluation  Patient identified by MRN, date of birth, ID band Patient awake    Reviewed: Allergy & Precautions, NPO status , Patient's Chart, lab work & pertinent test results, reviewed documented beta blocker date and time   History of Anesthesia Complications Negative for: history of anesthetic complications  Airway Mallampati: II  TM Distance: >3 FB Neck ROM: Full    Dental  (+) Dental Advisory Given, Teeth Intact   Pulmonary asthma , sleep apnea and Continuous Positive Airway Pressure Ventilation , former smoker   Pulmonary exam normal        Cardiovascular hypertension, Pt. on home beta blockers and Pt. on medications Normal cardiovascular exam   '23 TTE - EF 60 to 65%. Trivial mitral valve regurgitation.     Neuro/Psych  Neuromuscular disease  negative psych ROS   GI/Hepatic Neg liver ROS, hiatal hernia (s/p Nissen),GERD  Medicated and Controlled,,  Endo/Other  diabetes, Type 2, Oral Hypoglycemic Agents, Insulin Dependent  Morbid obesity On GLP-1a   Renal/GU negative Renal ROS     Musculoskeletal  (+) Arthritis ,    Abdominal   Peds  Hematology  (+) Blood dyscrasia, anemia   Anesthesia Other Findings   Reproductive/Obstetrics                             Anesthesia Physical Anesthesia Plan  ASA: 3  Anesthesia Plan: General   Post-op Pain Management: Tylenol PO (pre-op)* and Celebrex PO (pre-op)*   Induction: Intravenous  PONV Risk Score and Plan: 3 and Treatment may vary due to age or medical condition, Ondansetron, Dexamethasone and Midazolam  Airway Management Planned: Oral ETT  Additional Equipment: None  Intra-op Plan:   Post-operative Plan: Extubation in OR  Informed Consent: I have reviewed the patients History and Physical, chart, labs and discussed the procedure including the risks, benefits and alternatives for the proposed anesthesia with the  patient or authorized representative who has indicated his/her understanding and acceptance.     Dental advisory given  Plan Discussed with: CRNA and Anesthesiologist  Anesthesia Plan Comments: (See PAT note )        Anesthesia Quick Evaluation

## 2023-05-16 ENCOUNTER — Ambulatory Visit (HOSPITAL_COMMUNITY): Payer: BC Managed Care – PPO

## 2023-05-16 ENCOUNTER — Encounter (HOSPITAL_COMMUNITY): Payer: Self-pay

## 2023-05-16 ENCOUNTER — Other Ambulatory Visit: Payer: Self-pay

## 2023-05-16 ENCOUNTER — Encounter (HOSPITAL_COMMUNITY)
Admission: RE | Disposition: A | Discharge status: Home / Self Care | Payer: Self-pay | Source: Home / Self Care | Attending: Neurosurgery

## 2023-05-16 ENCOUNTER — Ambulatory Visit (HOSPITAL_COMMUNITY)
Admission: RE | Admit: 2023-05-16 | Discharge: 2023-05-17 | Disposition: A | Discharge status: Home / Self Care | Payer: BC Managed Care – PPO | Attending: Neurosurgery | Admitting: Neurosurgery

## 2023-05-16 ENCOUNTER — Ambulatory Visit (HOSPITAL_COMMUNITY): Payer: BC Managed Care – PPO | Admitting: Anesthesiology

## 2023-05-16 ENCOUNTER — Ambulatory Visit (HOSPITAL_COMMUNITY): Payer: BC Managed Care – PPO | Admitting: Medical

## 2023-05-16 DIAGNOSIS — Z7984 Long term (current) use of oral hypoglycemic drugs: Secondary | ICD-10-CM | POA: Insufficient documentation

## 2023-05-16 DIAGNOSIS — Z6841 Body Mass Index (BMI) 40.0 and over, adult: Secondary | ICD-10-CM | POA: Insufficient documentation

## 2023-05-16 DIAGNOSIS — G4730 Sleep apnea, unspecified: Secondary | ICD-10-CM | POA: Diagnosis not present

## 2023-05-16 DIAGNOSIS — M48061 Spinal stenosis, lumbar region without neurogenic claudication: Secondary | ICD-10-CM | POA: Insufficient documentation

## 2023-05-16 DIAGNOSIS — K219 Gastro-esophageal reflux disease without esophagitis: Secondary | ICD-10-CM | POA: Insufficient documentation

## 2023-05-16 DIAGNOSIS — Z7985 Long-term (current) use of injectable non-insulin antidiabetic drugs: Secondary | ICD-10-CM | POA: Diagnosis not present

## 2023-05-16 DIAGNOSIS — Z87891 Personal history of nicotine dependence: Secondary | ICD-10-CM | POA: Insufficient documentation

## 2023-05-16 DIAGNOSIS — Z9889 Other specified postprocedural states: Secondary | ICD-10-CM | POA: Diagnosis not present

## 2023-05-16 DIAGNOSIS — M5416 Radiculopathy, lumbar region: Secondary | ICD-10-CM | POA: Diagnosis not present

## 2023-05-16 DIAGNOSIS — I10 Essential (primary) hypertension: Secondary | ICD-10-CM | POA: Insufficient documentation

## 2023-05-16 DIAGNOSIS — M4726 Other spondylosis with radiculopathy, lumbar region: Secondary | ICD-10-CM | POA: Insufficient documentation

## 2023-05-16 DIAGNOSIS — J45909 Unspecified asthma, uncomplicated: Secondary | ICD-10-CM | POA: Diagnosis not present

## 2023-05-16 DIAGNOSIS — E119 Type 2 diabetes mellitus without complications: Secondary | ICD-10-CM | POA: Diagnosis not present

## 2023-05-16 DIAGNOSIS — Z794 Long term (current) use of insulin: Secondary | ICD-10-CM | POA: Diagnosis not present

## 2023-05-16 DIAGNOSIS — Z0189 Encounter for other specified special examinations: Secondary | ICD-10-CM | POA: Diagnosis not present

## 2023-05-16 HISTORY — PX: LUMBAR LAMINECTOMY/ DECOMPRESSION WITH MET-RX: SHX5959

## 2023-05-16 LAB — GLUCOSE, CAPILLARY
Glucose-Capillary: 138 mg/dL — ABNORMAL HIGH (ref 70–99)
Glucose-Capillary: 105 mg/dL — ABNORMAL HIGH (ref 70–99)
Glucose-Capillary: 116 mg/dL — ABNORMAL HIGH (ref 70–99)
Glucose-Capillary: 269 mg/dL — ABNORMAL HIGH (ref 70–99)

## 2023-05-16 SURGERY — LUMBAR LAMINECTOMY/ DECOMPRESSION WITH MET-RX
Anesthesia: General | Site: Spine Lumbar | Laterality: Left

## 2023-05-16 MED ORDER — MELOXICAM 7.5 MG PO TABS
15.0000 mg | ORAL_TABLET | Freq: Every day | ORAL | Status: DC
  Administered 2023-05-17: 15 mg via ORAL
  Filled 2023-05-16 (×2): qty 2

## 2023-05-16 MED ORDER — MENTHOL 3 MG MT LOZG: 1.0000 | LOZENGE | OROMUCOSAL | Status: DC | PRN

## 2023-05-16 MED ORDER — HYDROMORPHONE HCL 1 MG/ML IJ SOLN: 0.5000 mg | INTRAMUSCULAR | Status: DC | PRN

## 2023-05-16 MED ORDER — SODIUM CHLORIDE 0.9% FLUSH: 3.0000 mL | INTRAVENOUS | Status: DC | PRN

## 2023-05-16 MED ORDER — LACTATED RINGERS IV SOLN: INTRAVENOUS | Status: DC

## 2023-05-16 MED ORDER — BUPIVACAINE HCL 0.5 % IJ SOLN
INTRAMUSCULAR | Status: DC | PRN
  Administered 2023-05-16: 9 mL

## 2023-05-16 MED ORDER — CLOBETASOL PROPIONATE 0.05 % EX SOLN: 1.0000 | Freq: Two times a day (BID) | CUTANEOUS | Status: DC | PRN

## 2023-05-16 MED ORDER — ONDANSETRON HCL 4 MG/2ML IJ SOLN
INTRAMUSCULAR | Status: DC | PRN
  Administered 2023-05-16: 4 mg via INTRAVENOUS

## 2023-05-16 MED ORDER — POLYETHYLENE GLYCOL 3350 17 G PO PACK: 17.0000 g | PACK | Freq: Every day | ORAL | Status: DC | PRN

## 2023-05-16 MED ORDER — PANTOPRAZOLE SODIUM 20 MG PO TBEC
20.0000 mg | DELAYED_RELEASE_TABLET | Freq: Every day | ORAL | Status: DC
  Administered 2023-05-17: 20 mg via ORAL
  Filled 2023-05-16 (×2): qty 1

## 2023-05-16 MED ORDER — DEXAMETHASONE SODIUM PHOSPHATE 10 MG/ML IJ SOLN
INTRAMUSCULAR | Status: AC
  Filled 2023-05-16: qty 1

## 2023-05-16 MED ORDER — LIDOCAINE-EPINEPHRINE 1 %-1:100000 IJ SOLN
INTRAMUSCULAR | Status: AC
  Filled 2023-05-16: qty 1

## 2023-05-16 MED ORDER — LIDOCAINE-EPINEPHRINE 1 %-1:100000 IJ SOLN
INTRAMUSCULAR | Status: DC | PRN
  Administered 2023-05-16: 7 mL

## 2023-05-16 MED ORDER — HYDROCODONE-ACETAMINOPHEN 5-325 MG PO TABS
2.0000 | ORAL_TABLET | ORAL | Status: DC | PRN
  Administered 2023-05-17: 2 via ORAL
  Filled 2023-05-16: qty 2

## 2023-05-16 MED ORDER — METHYLPREDNISOLONE ACETATE 80 MG/ML IJ SUSP
INTRAMUSCULAR | Status: AC
  Filled 2023-05-16: qty 1

## 2023-05-16 MED ORDER — SUGAMMADEX SODIUM 200 MG/2ML IV SOLN
INTRAVENOUS | Status: DC | PRN
  Administered 2023-05-16: 200 mg via INTRAVENOUS
  Administered 2023-05-16: 100 mg via INTRAVENOUS

## 2023-05-16 MED ORDER — CHLORHEXIDINE GLUCONATE CLOTH 2 % EX PADS: 6.0000 | MEDICATED_PAD | Freq: Once | CUTANEOUS | Status: DC

## 2023-05-16 MED ORDER — LIDOCAINE 2% (20 MG/ML) 5 ML SYRINGE
INTRAMUSCULAR | Status: DC | PRN
  Administered 2023-05-16: 60 mg via INTRAVENOUS

## 2023-05-16 MED ORDER — LIDOCAINE 2% (20 MG/ML) 5 ML SYRINGE
INTRAMUSCULAR | Status: AC
  Filled 2023-05-16: qty 5

## 2023-05-16 MED ORDER — ROCURONIUM BROMIDE 10 MG/ML (PF) SYRINGE
PREFILLED_SYRINGE | INTRAVENOUS | Status: AC
  Filled 2023-05-16: qty 10

## 2023-05-16 MED ORDER — ACETAMINOPHEN 650 MG RE SUPP: 650.0000 mg | RECTAL | Status: DC | PRN

## 2023-05-16 MED ORDER — CHLORHEXIDINE GLUCONATE 0.12 % MT SOLN
15.0000 mL | Freq: Once | OROMUCOSAL | Status: AC
  Administered 2023-05-16: 15 mL via OROMUCOSAL
  Filled 2023-05-16: qty 15

## 2023-05-16 MED ORDER — VANCOMYCIN HCL 1500 MG/300ML IV SOLN
1500.0000 mg | INTRAVENOUS | Status: AC
  Administered 2023-05-16: 1500 mg via INTRAVENOUS
  Filled 2023-05-16: qty 300

## 2023-05-16 MED ORDER — ASPIRIN 81 MG PO TBEC
81.0000 mg | DELAYED_RELEASE_TABLET | Freq: Every day | ORAL | Status: DC
  Administered 2023-05-17: 81 mg via ORAL
  Filled 2023-05-16: qty 1

## 2023-05-16 MED ORDER — DOCUSATE SODIUM 100 MG PO CAPS
100.0000 mg | ORAL_CAPSULE | Freq: Two times a day (BID) | ORAL | Status: DC
  Administered 2023-05-16 – 2023-05-17 (×2): 100 mg via ORAL
  Filled 2023-05-16 (×2): qty 1

## 2023-05-16 MED ORDER — OXYCODONE HCL 5 MG/5ML PO SOLN: 5.0000 mg | Freq: Once | ORAL | Status: DC | PRN

## 2023-05-16 MED ORDER — DULAGLUTIDE 4.5 MG/0.5ML ~~LOC~~ SOAJ: 4.5000 mg | SUBCUTANEOUS | Status: DC

## 2023-05-16 MED ORDER — FENTANYL CITRATE (PF) 250 MCG/5ML IJ SOLN
INTRAMUSCULAR | Status: AC
  Filled 2023-05-16: qty 5

## 2023-05-16 MED ORDER — BUDESONIDE 0.25 MG/2ML IN SUSP
0.2500 mg | Freq: Two times a day (BID) | RESPIRATORY_TRACT | Status: DC
  Administered 2023-05-17: 0.25 mg via RESPIRATORY_TRACT
  Filled 2023-05-16 (×3): qty 2

## 2023-05-16 MED ORDER — FLEET ENEMA 7-19 GM/118ML RE ENEM: 1.0000 | ENEMA | Freq: Once | RECTAL | Status: DC | PRN

## 2023-05-16 MED ORDER — HYDROCODONE-ACETAMINOPHEN 5-325 MG PO TABS
1.0000 | ORAL_TABLET | ORAL | Status: DC | PRN
  Administered 2023-05-16: 1 via ORAL
  Filled 2023-05-16: qty 1

## 2023-05-16 MED ORDER — PROPOFOL 10 MG/ML IV BOLUS
INTRAVENOUS | Status: DC | PRN
  Administered 2023-05-16: 200 mg via INTRAVENOUS

## 2023-05-16 MED ORDER — VALACYCLOVIR HCL 500 MG PO TABS: 500.0000 mg | ORAL_TABLET | Freq: Two times a day (BID) | ORAL | Status: DC | PRN

## 2023-05-16 MED ORDER — MIDAZOLAM HCL 2 MG/2ML IJ SOLN
INTRAMUSCULAR | Status: AC
  Filled 2023-05-16: qty 2

## 2023-05-16 MED ORDER — SODIUM CHLORIDE 0.9 % IV SOLN: 250.0000 mL | INTRAVENOUS | Status: DC

## 2023-05-16 MED ORDER — ACETAMINOPHEN 500 MG PO TABS
1000.0000 mg | ORAL_TABLET | Freq: Once | ORAL | Status: AC
  Administered 2023-05-16: 1000 mg via ORAL
  Filled 2023-05-16: qty 2

## 2023-05-16 MED ORDER — MIDAZOLAM HCL 2 MG/2ML IJ SOLN
INTRAMUSCULAR | Status: DC | PRN
  Administered 2023-05-16: 2 mg via INTRAVENOUS

## 2023-05-16 MED ORDER — ACETAMINOPHEN 325 MG PO TABS: 650.0000 mg | ORAL_TABLET | ORAL | Status: DC | PRN

## 2023-05-16 MED ORDER — PHENOL 1.4 % MT LIQD: 1.0000 | OROMUCOSAL | Status: DC | PRN

## 2023-05-16 MED ORDER — PHENYLEPHRINE 80 MCG/ML (10ML) SYRINGE FOR IV PUSH (FOR BLOOD PRESSURE SUPPORT)
PREFILLED_SYRINGE | INTRAVENOUS | Status: DC | PRN
  Administered 2023-05-16 (×2): 240 ug via INTRAVENOUS
  Administered 2023-05-16: 160 ug via INTRAVENOUS

## 2023-05-16 MED ORDER — OXYCODONE HCL 5 MG PO TABS: 5.0000 mg | ORAL_TABLET | Freq: Once | ORAL | Status: DC | PRN

## 2023-05-16 MED ORDER — ONDANSETRON HCL 4 MG/2ML IJ SOLN: 4.0000 mg | Freq: Four times a day (QID) | INTRAMUSCULAR | Status: DC | PRN

## 2023-05-16 MED ORDER — PHENYLEPHRINE HCL-NACL 20-0.9 MG/250ML-% IV SOLN
INTRAVENOUS | Status: DC | PRN
  Administered 2023-05-16: 35 ug/min via INTRAVENOUS

## 2023-05-16 MED ORDER — ALBUTEROL SULFATE (2.5 MG/3ML) 0.083% IN NEBU: 3.0000 mL | INHALATION_SOLUTION | Freq: Four times a day (QID) | RESPIRATORY_TRACT | Status: DC | PRN

## 2023-05-16 MED ORDER — DEXAMETHASONE SODIUM PHOSPHATE 10 MG/ML IJ SOLN
INTRAMUSCULAR | Status: DC | PRN
  Administered 2023-05-16: 5 mg via INTRAVENOUS

## 2023-05-16 MED ORDER — 0.9 % SODIUM CHLORIDE (POUR BTL) OPTIME
TOPICAL | Status: DC | PRN
  Administered 2023-05-16: 1000 mL

## 2023-05-16 MED ORDER — METOPROLOL SUCCINATE ER 25 MG PO TB24
25.0000 mg | ORAL_TABLET | Freq: Every day | ORAL | Status: DC
  Administered 2023-05-17: 25 mg via ORAL
  Filled 2023-05-16 (×2): qty 1

## 2023-05-16 MED ORDER — ORAL CARE MOUTH RINSE: 15.0000 mL | Freq: Once | OROMUCOSAL | Status: AC

## 2023-05-16 MED ORDER — SODIUM CHLORIDE 0.9% FLUSH
3.0000 mL | Freq: Two times a day (BID) | INTRAVENOUS | Status: DC
  Administered 2023-05-16: 3 mL via INTRAVENOUS

## 2023-05-16 MED ORDER — FENTANYL CITRATE (PF) 100 MCG/2ML IJ SOLN: 25.0000 ug | INTRAMUSCULAR | Status: DC | PRN

## 2023-05-16 MED ORDER — ISOSORBIDE MONONITRATE ER 60 MG PO TB24
30.0000 mg | ORAL_TABLET | Freq: Every day | ORAL | Status: DC
  Administered 2023-05-17: 30 mg via ORAL
  Filled 2023-05-16 (×2): qty 1

## 2023-05-16 MED ORDER — NORTRIPTYLINE HCL 25 MG PO CAPS
100.0000 mg | ORAL_CAPSULE | Freq: Every day | ORAL | Status: DC
  Administered 2023-05-16: 100 mg via ORAL
  Filled 2023-05-16 (×2): qty 4

## 2023-05-16 MED ORDER — FUROSEMIDE 40 MG PO TABS
40.0000 mg | ORAL_TABLET | Freq: Every day | ORAL | Status: DC
  Administered 2023-05-17: 40 mg via ORAL
  Filled 2023-05-16: qty 1

## 2023-05-16 MED ORDER — FENTANYL CITRATE (PF) 100 MCG/2ML IJ SOLN
INTRAMUSCULAR | Status: DC | PRN
  Administered 2023-05-16 (×2): 50 ug via INTRAVENOUS

## 2023-05-16 MED ORDER — CYCLOBENZAPRINE HCL 10 MG PO TABS
10.0000 mg | ORAL_TABLET | Freq: Three times a day (TID) | ORAL | Status: DC | PRN
  Administered 2023-05-16: 10 mg via ORAL
  Filled 2023-05-16: qty 1

## 2023-05-16 MED ORDER — ONDANSETRON HCL 4 MG PO TABS: 4.0000 mg | ORAL_TABLET | Freq: Four times a day (QID) | ORAL | Status: DC | PRN

## 2023-05-16 MED ORDER — ROCURONIUM BROMIDE 10 MG/ML (PF) SYRINGE
PREFILLED_SYRINGE | INTRAVENOUS | Status: DC | PRN
  Administered 2023-05-16: 65 mg via INTRAVENOUS
  Administered 2023-05-16: 15 mg via INTRAVENOUS

## 2023-05-16 MED ORDER — AZELAIC ACID 15 % EX GEL: 1.0000 "application " | Freq: Every day | CUTANEOUS | Status: DC

## 2023-05-16 MED ORDER — THROMBIN 5000 UNITS EX SOLR: OROMUCOSAL | Status: DC | PRN

## 2023-05-16 MED ORDER — GABAPENTIN 400 MG PO CAPS: 400.0000 mg | ORAL_CAPSULE | Freq: Three times a day (TID) | ORAL | Status: DC | PRN

## 2023-05-16 MED ORDER — INSULIN ASPART 100 UNIT/ML IJ SOLN: 0.0000 [IU] | INTRAMUSCULAR | Status: DC | PRN

## 2023-05-16 MED ORDER — ADHERUS DURAL SEALANT
PACK | TOPICAL | Status: DC | PRN
  Administered 2023-05-16: 1

## 2023-05-16 MED ORDER — CELECOXIB 200 MG PO CAPS
200.0000 mg | ORAL_CAPSULE | Freq: Once | ORAL | Status: AC
  Administered 2023-05-16: 200 mg via ORAL
  Filled 2023-05-16: qty 1

## 2023-05-16 MED ORDER — POTASSIUM CHLORIDE IN NACL 20-0.9 MEQ/L-% IV SOLN
INTRAVENOUS | Status: DC
  Filled 2023-05-16: qty 1000

## 2023-05-16 MED ORDER — AMISULPRIDE (ANTIEMETIC) 5 MG/2ML IV SOLN: 10.0000 mg | Freq: Once | INTRAVENOUS | Status: DC | PRN

## 2023-05-16 MED ORDER — PROPOFOL 10 MG/ML IV BOLUS
INTRAVENOUS | Status: AC
  Filled 2023-05-16: qty 20

## 2023-05-16 MED ORDER — THROMBIN 5000 UNITS EX SOLR
CUTANEOUS | Status: AC
  Filled 2023-05-16: qty 5000

## 2023-05-16 MED ORDER — ONDANSETRON HCL 4 MG/2ML IJ SOLN
INTRAMUSCULAR | Status: AC
  Filled 2023-05-16: qty 2

## 2023-05-16 MED ORDER — ENALAPRIL MALEATE 2.5 MG PO TABS
2.5000 mg | ORAL_TABLET | Freq: Every day | ORAL | Status: DC
  Administered 2023-05-16: 2.5 mg via ORAL
  Filled 2023-05-16 (×2): qty 1

## 2023-05-16 MED ORDER — VANCOMYCIN HCL 1250 MG/250ML IV SOLN
1250.0000 mg | Freq: Two times a day (BID) | INTRAVENOUS | Status: AC
  Administered 2023-05-17 (×2): 1250 mg via INTRAVENOUS
  Filled 2023-05-16 (×2): qty 250

## 2023-05-16 MED ORDER — INSULIN GLARGINE-YFGN 100 UNIT/ML ~~LOC~~ SOLN
70.0000 [IU] | Freq: Every day | SUBCUTANEOUS | Status: DC
  Administered 2023-05-16: 70 [IU] via SUBCUTANEOUS
  Filled 2023-05-16 (×2): qty 0.7

## 2023-05-16 MED ORDER — BUPIVACAINE HCL (PF) 0.5 % IJ SOLN
INTRAMUSCULAR | Status: AC
  Filled 2023-05-16: qty 30

## 2023-05-16 SURGICAL SUPPLY — 63 items
ADH SKN CLS APL DERMABOND .7 (GAUZE/BANDAGES/DRESSINGS) ×1
APL SKNCLS NONHYPOALLERGENIC (GAUZE/BANDAGES/DRESSINGS)
APL SKNCLS STERI-STRIP NONHPOA (GAUZE/BANDAGES/DRESSINGS)
BAG COUNTER SPONGE SURGICOUNT (BAG) ×1 IMPLANT
BAG SPNG CNTER NS LX DISP (BAG) ×1
BENZOIN TINCTURE PRP APPL 2/3 (GAUZE/BANDAGES/DRESSINGS) IMPLANT
BLADE CLIPPER SURG (BLADE) IMPLANT
BUR CARBIDE MATCH 3.0 (BURR) IMPLANT
BUR SURGICAL HEAD PROX 3X12.5 (BURR) ×1 IMPLANT
BURR SURGICAL HEAD PROX 3X12.5 (BURR) ×1
CANISTER SUCT 3000ML PPV (MISCELLANEOUS) ×1 IMPLANT
DERMABOND ADVANCED .7 DNX12 (GAUZE/BANDAGES/DRESSINGS) IMPLANT
DRAPE C-ARM 42X72 X-RAY (DRAPES) ×2 IMPLANT
DRAPE LAPAROTOMY 100X72X124 (DRAPES) ×1 IMPLANT
DRAPE MICROSCOPE SLANT 54X150 (MISCELLANEOUS) ×1 IMPLANT
DRAPE SURG 17X23 STRL (DRAPES) ×1 IMPLANT
DRESSING MEPILEX FLEX 4X4 (GAUZE/BANDAGES/DRESSINGS) ×1 IMPLANT
DRSG MEPILEX FLEX 4X4 (GAUZE/BANDAGES/DRESSINGS) ×1
DRSG OPSITE 4X5.5 SM (GAUZE/BANDAGES/DRESSINGS) IMPLANT
DURAPREP 26ML APPLICATOR (WOUND CARE) ×1 IMPLANT
ELECT BLADE INSULATED 6.5IN (ELECTROSURGICAL) ×1
ELECT REM PT RETURN 9FT ADLT (ELECTROSURGICAL) ×1
ELECTRODE BLDE INSULATED 6.5IN (ELECTROSURGICAL) ×1 IMPLANT
ELECTRODE REM PT RTRN 9FT ADLT (ELECTROSURGICAL) ×1 IMPLANT
GAUZE 4X4 16PLY ~~LOC~~+RFID DBL (SPONGE) IMPLANT
GAUZE SPONGE 4X4 12PLY STRL (GAUZE/BANDAGES/DRESSINGS) IMPLANT
GLOVE BIO SURGEON STRL SZ7 (GLOVE) ×2 IMPLANT
GLOVE BIOGEL PI IND STRL 7.5 (GLOVE) ×3 IMPLANT
GLOVE ECLIPSE 7.5 STRL STRAW (GLOVE) ×1 IMPLANT
GOWN STRL REUS W/ TWL LRG LVL3 (GOWN DISPOSABLE) ×2 IMPLANT
GOWN STRL REUS W/ TWL XL LVL3 (GOWN DISPOSABLE) ×1 IMPLANT
GOWN STRL REUS W/TWL 2XL LVL3 (GOWN DISPOSABLE) IMPLANT
GOWN STRL REUS W/TWL LRG LVL3 (GOWN DISPOSABLE) ×2
GOWN STRL REUS W/TWL XL LVL3 (GOWN DISPOSABLE) ×1
GRAFT DURAGEN MATRIX 1WX1L (Tissue) IMPLANT
HEMOSTAT POWDER KIT SURGIFOAM (HEMOSTASIS) ×1 IMPLANT
IV CATH AUTO 14GX1.75 SAFE ORG (IV SOLUTION) ×1 IMPLANT
KIT BASIN OR (CUSTOM PROCEDURE TRAY) ×1 IMPLANT
KIT TURNOVER KIT B (KITS) ×1 IMPLANT
NDL HYPO 18GX1.5 BLUNT FILL (NEEDLE) IMPLANT
NDL HYPO 25X1 1.5 SAFETY (NEEDLE) ×1 IMPLANT
NDL SPNL 18GX3.5 QUINCKE PK (NEEDLE) IMPLANT
NEEDLE HYPO 18GX1.5 BLUNT FILL (NEEDLE) IMPLANT
NEEDLE HYPO 25X1 1.5 SAFETY (NEEDLE) ×1 IMPLANT
NEEDLE SPNL 18GX3.5 QUINCKE PK (NEEDLE) IMPLANT
NS IRRIG 1000ML POUR BTL (IV SOLUTION) ×1 IMPLANT
PACK LAMINECTOMY NEURO (CUSTOM PROCEDURE TRAY) ×1 IMPLANT
PAD ARMBOARD 7.5X6 YLW CONV (MISCELLANEOUS) ×3 IMPLANT
SEALANT ADHERUS EXTEND TIP (MISCELLANEOUS) IMPLANT
SOL ELECTROSURG ANTI STICK (MISCELLANEOUS)
SOLUTION ELECTROSURG ANTI STCK (MISCELLANEOUS) ×1 IMPLANT
SPIKE FLUID TRANSFER (MISCELLANEOUS) ×1 IMPLANT
SPONGE T-LAP 4X18 ~~LOC~~+RFID (SPONGE) IMPLANT
STRIP CLOSURE SKIN 1/2X4 (GAUZE/BANDAGES/DRESSINGS) IMPLANT
SUT MNCRL AB 4-0 PS2 18 (SUTURE) ×1 IMPLANT
SUT VIC AB 0 CT1 18XCR BRD8 (SUTURE) IMPLANT
SUT VIC AB 0 CT1 8-18 (SUTURE)
SUT VIC AB 2-0 CP2 18 (SUTURE) ×1 IMPLANT
SUT VIC AB 3-0 SH 8-18 (SUTURE) ×1 IMPLANT
SYR 3ML LL SCALE MARK (SYRINGE) IMPLANT
TOWEL GREEN STERILE (TOWEL DISPOSABLE) ×1 IMPLANT
TOWEL GREEN STERILE FF (TOWEL DISPOSABLE) ×1 IMPLANT
WATER STERILE IRR 1000ML POUR (IV SOLUTION) ×1 IMPLANT

## 2023-05-16 NOTE — Progress Notes (Signed)
   05/16/23 2124  BiPAP/CPAP/SIPAP  BiPAP/CPAP/SIPAP Pt Type Adult  Reason BIPAP/CPAP not in use Non-compliant

## 2023-05-16 NOTE — Anesthesia Postprocedure Evaluation (Signed)
Anesthesia Post Note  Patient: Lisa Burns  Procedure(s) Performed: LUMBAR THREE-LUMBAR FOUR LAMINOTOMY, MEDICAL FACETECTOMY AND FORAMINOTOMY (Left: Spine Lumbar)     Patient location during evaluation: PACU Anesthesia Type: General Level of consciousness: awake and alert Pain management: pain level controlled Vital Signs Assessment: post-procedure vital signs reviewed and stable Respiratory status: spontaneous breathing, nonlabored ventilation and respiratory function stable Cardiovascular status: stable and blood pressure returned to baseline Anesthetic complications: no   No notable events documented.  Last Vitals:  Vitals:   05/16/23 1745 05/16/23 1806  BP: (!) 147/76 132/62  Pulse: 66 64  Resp: 19 18  Temp: 36.8 C 36.6 C  SpO2: 96% 98%    Last Pain:  Vitals:   05/16/23 1806  TempSrc: Oral  PainSc:                  Beryle Lathe

## 2023-05-16 NOTE — Anesthesia Procedure Notes (Signed)
Procedure Name: Intubation Date/Time: 05/16/2023 3:04 PM  Performed by: De Nurse, CRNAPre-anesthesia Checklist: Patient identified, Emergency Drugs available, Suction available and Patient being monitored Patient Re-evaluated:Patient Re-evaluated prior to induction Oxygen Delivery Method: Circle System Utilized Preoxygenation: Pre-oxygenation with 100% oxygen Induction Type: IV induction Ventilation: Mask ventilation without difficulty Laryngoscope Size: Mac and 3 Grade View: Grade II Tube type: Oral Tube size: 7.0 mm Number of attempts: 1 Airway Equipment and Method: Stylet and Oral airway Placement Confirmation: ETT inserted through vocal cords under direct vision, positive ETCO2 and breath sounds checked- equal and bilateral Secured at: 19 cm Tube secured with: Tape Dental Injury: Teeth and Oropharynx as per pre-operative assessment

## 2023-05-16 NOTE — H&P (Signed)
CC: Left leg pain  HPI:     Patient is a 62 y.o. female presents with left leg sciatica refractory to PT and medical therapy.  She is here for elective surgery.    Patient Active Problem List   Diagnosis Date Noted   Heart murmur 04/26/2022   Precordial chest pain 04/26/2022   Family history of early CAD 04/26/2022   Fatty liver 01/23/2020   Upper abdominal pain 01/23/2020   Constipation 01/23/2020   Elevated lipase 01/23/2020   Personal history of colonic polyps    Morbid obesity (HCC) 06/11/2017   Special screening for malignant neoplasms, colon    Essential hypertension, benign 04/29/2013   Mixed hyperlipidemia 04/29/2013   Uncontrolled type 2 diabetes mellitus with complication, with long-term current use of insulin 04/29/2013   Hereditary and idiopathic peripheral neuropathy 04/29/2013   Asthma with acute exacerbation 02/26/2013   Past Medical History:  Diagnosis Date   Acute pharyngitis    Allergy    Anemia    Arthritis    Asthma    Constipation    Cough    Diabetes mellitus with neuropathy (HCC)    Diabetes mellitus without complication (HCC)    GERD (gastroesophageal reflux disease)    Heart murmur    Heart murmur    History of hiatal hernia    Sx in 2000   Hyperlipidemia    Hypertension    Microproteinuria    Neuropathy    Obesity    OSA (obstructive sleep apnea)    Proteinuria    Reflux    Snoring    Ulcer of mouth    URI (upper respiratory infection)    Venous stasis     Past Surgical History:  Procedure Laterality Date   CESAREAN SECTION  1986   COLONOSCOPY N/A 04/24/2016   Procedure: COLONOSCOPY;  Surgeon: West Bali, MD;  [facility]: [facility];  Service: Endoscopy;  Laterality: N/A;  8:30 Am   COLONOSCOPY N/A 07/18/2019   Dr. Darrick Penna: 6 simple adenomas removed.  External/internal hemorrhoids.  Next colonoscopy in 3 years.   LAPAROSCOPIC NISSEN FUNDOPLICATION  2000   OTHER SURGICAL HISTORY     gerd surgery   POLYPECTOMY  07/18/2019    Procedure: POLYPECTOMY;  Surgeon: West Bali, MD;  [facility]: [facility];  Service: Endoscopy;;   THROAT SURGERY  2000   TONSILLECTOMY     As a child    Medications Prior to Admission  Medication Sig Dispense Refill Last Dose   albuterol (PROAIR HFA) 108 (90 Base) MCG/ACT inhaler INHALE TWO PUFFS INTO THE LUNGS EVERY SIX HOURS AS NEEDED 8.5 g 5 05/16/2023 at 0830   aspirin EC 81 MG tablet Take 1 tablet (81 mg total) by mouth daily. Swallow whole. 90 tablet 3 Past Month   Azelaic Acid 15 % cream Apply 1 application topically daily. Apply to face   Past Week   clobetasol (TEMOVATE) 0.05 % external solution Apply 1 application  topically 2 (two) times daily as needed (psoriasis).   Past Week   cyclobenzaprine (FLEXERIL) 10 MG tablet Take 10 mg by mouth 3 (three) times daily as needed for muscle spasms.   05/15/2023   Dulaglutide (TRULICITY) 4.5 MG/0.5ML SOPN Inject 4.5 mg as directed once a week. 6 mL 3 Past Month   enalapril (VASOTEC) 20 MG tablet TAKE ONE TABLET (20MG  TOTAL) BY MOUTH ATBEDTIME 90 tablet 0 05/15/2023   Evolocumab (REPATHA SURECLICK) 140 MG/ML SOAJ INJECT 140MG  INTO THE SKIN EVERY 14 DAYS  6 mL 3 Past Month   fluticasone (FLONASE) 50 MCG/ACT nasal spray Place 1 spray into both nostrils daily. 16 g 5 Past Month   fluticasone (FLOVENT HFA) 110 MCG/ACT inhaler INHALE TWO PUFFS INTO THE LUNGS TWICE DAILY (Patient taking differently: Inhale 2 puffs into the lungs 2 (two) times daily as needed (shortness of wheezing).) 12 g 5 Past Month   furosemide (LASIX) 40 MG tablet TAKE ONE TABLET (40MG  TOTAL) BY MOUTH EVERY MORNING 15 tablet 0 05/15/2023   gabapentin (NEURONTIN) 400 MG capsule TAKE ONE (1) CAPSULE THREE (3) TIMES EACH DAY (Patient taking differently: Take 400 mg by mouth 3 (three) times daily as needed (pain).) 270 capsule 1 05/16/2023 at 0830   glucose blood test strip Use as instructed 100 each 12    insulin glargine (LANTUS SOLOSTAR) 100 UNIT/ML Solostar Pen Inject 70 Units  into the skin at bedtime. 75 mL 3 05/15/2023   isosorbide mononitrate (IMDUR) 30 MG 24 hr tablet Take 1 tablet (30 mg total) by mouth daily. 90 tablet 3 05/16/2023 at 0830   lansoprazole (PREVACID) 15 MG capsule Take 1 capsule (15 mg total) by mouth daily. 90 capsule 1 05/16/2023 at 0830   loratadine (CLARITIN) 10 MG tablet Take 10 mg by mouth daily.    05/16/2023 at 0830   meloxicam (MOBIC) 15 MG tablet Take 15 mg by mouth daily.   Past Month   metoprolol succinate (TOPROL XL) 25 MG 24 hr tablet Take 1 tablet (25 mg total) by mouth daily. 90 tablet 3 05/16/2023 at 0830   NON FORMULARY Pt uses a cpap nightly      nortriptyline (PAMELOR) 50 MG capsule TAKE TWO CAPSULES BY MOUTH AT BEDTIME 180 capsule 0 05/15/2023   valACYclovir (VALTREX) 500 MG tablet Take 500 mg by mouth 2 (two) times daily as needed (cold sore flare ups).    Past Month   Allergies  Allergen Reactions   Augmentin [Amoxicillin-Pot Clavulanate]     GI upset Did it involve swelling of the face/tongue/throat, SOB, or low BP? No Did it involve sudden or severe rash/hives, skin peeling, or any reaction on the inside of your mouth or nose? No Did you need to seek medical attention at a hospital or doctor's office? No When did it last happen?      15 years If all above answers are "NO", may proceed with cephalosporin use.    Cinnamon Hives and Swelling   Coconut Flavor Hives and Swelling   Januvia [Sitagliptin]     GI upset   Metformin Diarrhea    GI Upset   Pravastatin     Pain in joints   Strawberry (Diagnostic) Hives and Swelling    Social History   Tobacco Use   Smoking status: Former    Packs/day: 1.00    Years: 5.00    Additional pack years: 0.00    Total pack years: 5.00    Types: Cigarettes    Quit date: 2004    Years since quitting: 20.4   Smokeless tobacco: Never  Substance Use Topics   Alcohol use: Yes    Alcohol/week: 0.0 standard drinks of alcohol    Comment: occasionally    Family History  Problem  Relation Age of Onset   Diabetes Mother    Heart disease Father    Cancer Father        lung   Colon cancer Father    Heart disease Sister    Diabetes Sister    Heart disease  Brother    Diabetes Brother    Colon cancer Paternal Grandmother      Review of Systems Pertinent items are noted in HPI.  Objective:   Patient Vitals for the past 8 hrs:  BP Temp Temp src Pulse Resp SpO2 Height Weight  05/16/23 1130 127/74 98.2 F (36.8 C) Oral 85 20 93 % 5' (1.524 m) 100.7 kg   No intake/output data recorded. No intake/output data recorded.      General : Alert, cooperative, no distress, appears stated age   Head:  Normocephalic/atraumatic    Eyes: PERRL, conjunctiva/corneas clear, EOM's intact. Fundi could not be visualized Neck: Supple Chest:  Respirations unlabored Chest wall: no tenderness or deformity Heart: Regular rate and rhythm Abdomen: Soft, nontender and nondistended Extremities: warm and well-perfused Skin: normal turgor, color and texture Neurologic:  Alert, oriented x 3.  Eyes open spontaneously. PERRL, EOMI, VFC, no facial droop. V1-3 intact.  No dysarthria, tongue protrusion symmetric.  CNII-XII intact. Normal strength, sensation and reflexes throughout.  No pronator drift, full strength in legs.  + L SLR        Data ReviewCBC:  Lab Results  Component Value Date   WBC 4.8 05/15/2023   RBC 3.96 05/15/2023   BMP:  Lab Results  Component Value Date   GLUCOSE 99 05/15/2023   CO2 27 05/15/2023   BUN 12 05/15/2023   BUN 13 03/30/2022   CREATININE 0.86 05/15/2023   CREATININE 0.76 07/07/2021   CALCIUM 9.2 05/15/2023   Radiology review:  See clinic note  Assessment:   Active Problems:   * No active hospital problems. *  Left L3-4 stenosis Plan:   - MIS decompression today

## 2023-05-16 NOTE — Transfer of Care (Signed)
Immediate Anesthesia Transfer of Care Note  Patient: Lisa Burns  Procedure(s) Performed: LUMBAR THREE-LUMBAR FOUR LAMINOTOMY, MEDICAL FACETECTOMY AND FORAMINOTOMY (Left: Spine Lumbar)  Patient Location: PACU  Anesthesia Type:General  Level of Consciousness: lethargic  Airway & Oxygen Therapy: Patient Spontanous Breathing and Patient connected to face mask oxygen  Post-op Assessment: Report given to RN  Post vital signs: Reviewed and stable  Last Vitals:  Vitals Value Taken Time  BP 121/59   Temp    Pulse 69 05/16/23 1707  Resp 18 05/16/23 1707  SpO2 97 % 05/16/23 1707  Vitals shown include unvalidated device data.  Last Pain:  Vitals:   05/16/23 1136  TempSrc:   PainSc: 0-No pain         Complications: No notable events documented.

## 2023-05-16 NOTE — Op Note (Signed)
Procedure(s): LUMBAR THREE-LUMBAR FOUR LAMINOTOMY, MEDICAL FACETECTOMY AND FORAMINOTOMY Procedure Note  Lisa Burns female 62 y.o. 05/16/2023  Procedure(s) and Anesthesia Type:    * LUMBAR THREE-LUMBAR FOUR LAMINOTOMY, MEDICAL FACETECTOMY AND FORAMINOTOMY - General  Surgeon(s) and Role:    Maisie Fus, Coy Saunas, MD - Primary    Coletta Memos, MD - Assisting   Indications: This is a 62 yo F who developed persistent left lumbar radiculopathy with left L3-4 subarticular stenosis from hypertrophied facet.  Physical and medical therapy failed to improve her symptoms and she wished to proceed with surgery.  Risks, benefits, alternatives, and expected convalescence were discussed with her.  Risks discussed included, but were not limited to bleeding, pain, infection, scar, spinal fluid leak, neurologic deficit, instability, damage to nearby organs, and death.  Informed consent was obtained.      Surgeon: Bedelia Person   Assistants: Barbaraann Barthel, MD.  Please note there were no qualified trainees available to assist.  Anesthesia: General endotracheal anesthesia  ASA Class: none    Procedure Detail  LUMBAR THREE-LUMBAR FOUR LAMINOTOMY, MEDICAL FACETECTOMY AND FORAMINOTOMY  The patient was brought to the operating room. After induction of general anesthesia, the patient was positioned on the operative table in the prone position on a Wilson frame with all pressure points meticulously padded. The skin of the low back was then prepped and draped in the usual sterile fashion.  Under fluoroscopy, the correct levels were identified and marked out on the skin, and after timeout was conducted, the skin was infiltrated with local anesthetic.  Paramedian skin incision was then made sharply over the affected level and the subcutaneous tissue and fascia were incised.  Initial dilator was then passed to the interspace under fluoroscopic guidance.  The paraspinous muscle attachments were dissected  from the left L3 lamina using the dilators.  Successive dilators were used until a 20 mm tubular retractor was placed under fluoroscopic guidance and locked in place with an attachment arm.  Microscope was then introduced into the field.  Small amount of remaining musculature was dissected from the lamina and the interspace was visualized as well.  The medial facet was also exposed.  High-speed drill was used to perform a laminotomy as well as a modest medial facetectomy of the hypertrophied facet.  The ligamentum flavum was then removed with rongeurs.  The thecal sac and traversing nerve root were identified.  Upon removing ligamentum flavum, two small longitudinal durotomies were noted under the removed medial facet with intact arachnoid.  Foraminotomy was performed.  Good decompression of the nerve root was confirmed with easy passage of nerve hook.   Meticulous hemostasis was obtained.  The wound was irrigated thoroughly.  As arachnoid was intact for the two small tears, a piece of duragen followed by Adherus dural spray was placed.  The tubular retractor was then withdrawn with hemostasis in the muscle obtained with bipolar.  The muscles were injected with half percent Marcaine.  The fascia was closed with 0 Vicryl stitches.  The dermal layer was closed with 2-0 Vicryl stitches in buried interrupted fashion.  The skin was closed with 4-0 Monocryl in subcuticular manner followed by Dermabond.  A sterile dressing was placed.  Patient was then flipped supine and extubated by the anesthesia service.  All counts were correct at the end of surgery.  No complications were noted.   Findings: Small incidental durotomies x 2, successful decompression  Estimated Blood Loss:  25 ml  Drains: none         Total IV Fluids: see anesthesia records  Blood Given: none          Specimens: none         Implants: none        Complications:  * No complications entered in OR log *         Disposition: PACU -  hemodynamically stable.         Condition: stable

## 2023-05-17 ENCOUNTER — Encounter (HOSPITAL_COMMUNITY): Payer: Self-pay | Admitting: Neurosurgery

## 2023-05-17 DIAGNOSIS — Z794 Long term (current) use of insulin: Secondary | ICD-10-CM | POA: Diagnosis not present

## 2023-05-17 DIAGNOSIS — Z7984 Long term (current) use of oral hypoglycemic drugs: Secondary | ICD-10-CM | POA: Diagnosis not present

## 2023-05-17 DIAGNOSIS — E119 Type 2 diabetes mellitus without complications: Secondary | ICD-10-CM | POA: Diagnosis not present

## 2023-05-17 DIAGNOSIS — Z87891 Personal history of nicotine dependence: Secondary | ICD-10-CM | POA: Diagnosis not present

## 2023-05-17 DIAGNOSIS — I10 Essential (primary) hypertension: Secondary | ICD-10-CM | POA: Diagnosis not present

## 2023-05-17 DIAGNOSIS — Z9889 Other specified postprocedural states: Secondary | ICD-10-CM | POA: Diagnosis not present

## 2023-05-17 DIAGNOSIS — M48061 Spinal stenosis, lumbar region without neurogenic claudication: Secondary | ICD-10-CM | POA: Diagnosis not present

## 2023-05-17 DIAGNOSIS — Z6841 Body Mass Index (BMI) 40.0 and over, adult: Secondary | ICD-10-CM | POA: Diagnosis not present

## 2023-05-17 DIAGNOSIS — G4730 Sleep apnea, unspecified: Secondary | ICD-10-CM | POA: Diagnosis not present

## 2023-05-17 DIAGNOSIS — Z7985 Long-term (current) use of injectable non-insulin antidiabetic drugs: Secondary | ICD-10-CM | POA: Diagnosis not present

## 2023-05-17 DIAGNOSIS — M4726 Other spondylosis with radiculopathy, lumbar region: Secondary | ICD-10-CM | POA: Diagnosis not present

## 2023-05-17 DIAGNOSIS — K219 Gastro-esophageal reflux disease without esophagitis: Secondary | ICD-10-CM | POA: Diagnosis not present

## 2023-05-17 LAB — GLUCOSE, CAPILLARY: Glucose-Capillary: 202 mg/dL — ABNORMAL HIGH (ref 70–99)

## 2023-05-17 MED ORDER — HYDROCODONE-ACETAMINOPHEN 5-325 MG PO TABS: 1.0000 | ORAL_TABLET | Freq: Four times a day (QID) | ORAL | 0 refills | Status: DC | PRN

## 2023-05-17 MED ORDER — DOCUSATE SODIUM 100 MG PO CAPS: 100.0000 mg | ORAL_CAPSULE | Freq: Two times a day (BID) | ORAL | 2 refills | Status: DC

## 2023-05-17 NOTE — Plan of Care (Signed)

## 2023-05-17 NOTE — Inpatient Diabetes Management (Signed)
Inpatient Diabetes Program Recommendations  AACE/ADA: New Consensus Statement on Inpatient Glycemic Control (2015)  Target Ranges:  Prepandial:   less than 140 mg/dL      Peak postprandial:   less than 180 mg/dL (1-2 hours)      Critically ill patients:  140 - 180 mg/dL   Lab Results  Component Value Date   GLUCAP 202 (H) 05/17/2023   HGBA1C 7.3 (H) 05/15/2023    Review of Glycemic Control  Latest Reference Range & Units 05/15/23 08:04 05/16/23 11:32 05/16/23 13:47 05/16/23 17:13 05/16/23 21:13 05/17/23 06:47  Glucose-Capillary 70 - 99 mg/dL 161 (H) 096 (H) 045 (H) 116 (H) 269 (H) 202 (H)   Diabetes history: DM 2 Outpatient Diabetes medications: Lantus 70 units, trulicity 4.5 mg Weekly Current orders for Inpatient glycemic control:  Semglee 70 units qhs  Note: Decadron 5 mg given yesterday during surgery. Glucose trends> 200 A1c 7.3% on 6/25  Inpatient Diabetes Program Recommendations:    -  if appropriate consider Novolog 0-9 units tid + hs  Thanks,  Christena Deem RN, MSN, BC-ADM Inpatient Diabetes Coordinator Team Pager [phone] (8a-5p)

## 2023-05-17 NOTE — Progress Notes (Signed)
Patient alert and oriented, mae's well, voiding adequate amount of urine, swallowing without difficulty, no c/o pain at time of discharge. Patient discharged home with family. Script and discharged instructions given to patient. Patient and family stated understanding of instructions given. Patient has an appointment with Dr. Thomas in 2 weeks 

## 2023-05-17 NOTE — Evaluation (Signed)
Physical Therapy Evaluation Patient Details Name: Lisa Burns MRN: [id number] DOB: 1961/08/10 Today's Date: 05/17/2023  History of Present Illness  Patient is a 62 y.o. female s/p L3-4 laminotomy, and medical facetectomy and foraminotomy (05/16/2023). PMH significant for DM, Hiatal Hernia, HLD. HTN, Obesity, URI.   Clinical Impression  At time of evaluation, patient supine with HOB elevated A&Ox4. Prior to admission, pt endorsed independence with functional mobility and ADLs. Pt reports working and driving prior to sx, has family support at home who is available PRN following discharge. Today, she was able to perform bed mobility, transfers and ambulation with no physical assistance. PT provided hand held assist for descending stairway; pt presented with very cautious step. PT provided min guard throughout session for safety. End of session included education on exercise progression, car transfers into a truck and review of spinal precautions. Recommend patient to return home with family support as needed.        Recommendations for follow up therapy are one component of a multi-disciplinary discharge planning process, led by the attending physician.  Recommendations may be updated based on patient status, additional functional criteria and insurance authorization.  Follow Up Recommendations       Assistance Recommended at Discharge Set up Supervision/Assistance  Patient can return home with the following  A little help with bathing/dressing/bathroom;A little help with walking and/or transfers;Assist for transportation;Help with stairs or ramp for entrance    Equipment Recommendations None recommended by PT  Recommendations for Other Services       Functional Status Assessment Patient has had a recent decline in their functional status and demonstrates the ability to make significant improvements in function in a reasonable and predictable amount of time.     Precautions /  Restrictions Precautions Precautions: Back Precaution Booklet Issued: Yes (comment) Precaution Comments: Reviewed and verbalized throughout functional mobility. Restrictions Weight Bearing Restrictions: No      Mobility  Bed Mobility Overal bed mobility: Modified Independent             General bed mobility comments: Increased time; lowered HOB to simulate home environment    Transfers Overall transfer level: Modified independent Equipment used: None               General transfer comment: Patient able to perform sit <> stand transfer without physical assistance. Uses bilateral UE from bed for power up.    Ambulation/Gait Ambulation/Gait assistance: Min guard Gait Distance (Feet): 200 Feet Assistive device: None Gait Pattern/deviations: Step-to pattern, Step-through pattern, Decreased step length - left, Decreased stride length Gait velocity: Decreased Gait velocity interpretation: <1.8 ft/sec, indicate of risk for recurrent falls   General Gait Details: Provided min guard for safety; initial presentation with step to pattern and reduced step legnth on RLE. Improved with prolonged ambulation; presented with step through pattern and no LOB or instability.  Stairs Stairs: Yes Stairs assistance: Min guard Stair Management: No rails, Forwards Number of Stairs: 2 General stair comments: Able to perform without rail support and step to patter; PT provided HHA on descent.  Wheelchair Mobility    Modified Rankin (Stroke Patients Only)       Balance Overall balance assessment: Needs assistance Sitting-balance support: No upper extremity supported, Feet supported Sitting balance-Leahy Scale: Good Sitting balance - Comments: Able to sit EOB   Standing balance support: No upper extremity supported, Single extremity supported, During functional activity Standing balance-Leahy Scale: Fair Standing balance comment: Able to stand and amb without physical assistance;  required single  UE support with step.                             Pertinent Vitals/Pain Pain Assessment Pain Assessment: 0-10 Pain Score: 6  Pain Location: Incision Site Pain Descriptors / Indicators: Tender, Sore Pain Intervention(s): Monitored during session    Home Living Family/patient expects to be discharged to:: Private residence Living Arrangements: Spouse/significant other;Children Available Help at Discharge: Family;Available PRN/intermittently Type of Home: House Home Access: Stairs to enter Entrance Stairs-Rails: None Entrance Stairs-Number of Steps: 1 Alternate Level Stairs-Number of Steps: 10 Home Layout: Multi-level;Laundry or work area in [employer]: None Additional Comments: Pt reported she doesn't need to access basement, family will assist PRN.    Prior Function Prior Level of Function : Independent/Modified Independent;Working/employed;Driving             Mobility Comments: Independent ADLs Comments: Independent     Hand Dominance   Dominant Hand: Right    Extremity/Trunk Assessment   Upper Extremity Assessment Upper Extremity Assessment: Overall WFL for tasks assessed    Lower Extremity Assessment Lower Extremity Assessment: Overall WFL for tasks assessed    Cervical / Trunk Assessment Cervical / Trunk Assessment: Back Surgery  Communication   Communication: No difficulties  Cognition Arousal/Alertness: Awake/alert Behavior During Therapy: WFL for tasks assessed/performed Overall Cognitive Status: Within Functional Limits for tasks assessed                                 General Comments: Pleasant throughout session        General Comments      Exercises     Assessment/Plan    PT Assessment Patient does not need any further PT services  PT Problem List         PT Treatment Interventions      PT Goals (Current goals can be found in the Care Plan section)  Acute Rehab PT  Goals Patient Stated Goal: Patient would liek to return home with improved pain. PT Goal Formulation: All assessment and education complete, DC therapy    Frequency       Co-evaluation               AM-PAC PT "6 Clicks" Mobility  Outcome Measure Help needed turning from your back to your side while in a flat bed without using bedrails?: None Help needed moving from lying on your back to sitting on the side of a flat bed without using bedrails?: None Help needed moving to and from a bed to a chair (including a wheelchair)?: None Help needed standing up from a chair using your arms (e.g., wheelchair or bedside chair)?: None Help needed to walk in hospital room?: A Little Help needed climbing 3-5 steps with a railing? : A Little 6 Click Score: 22    End of Session Equipment Utilized During Treatment: Gait belt Activity Tolerance: Patient tolerated treatment well Patient left: in bed;with call bell/phone within reach Nurse Communication: Mobility status PT Visit Diagnosis: Difficulty in walking, not elsewhere classified (R26.2);Pain Pain - part of body:  (Lower Back)    Time: 9147-8295 PT Time Calculation (min) (ACUTE ONLY): 18 min   Charges:   PT Evaluation $PT Eval Low Complexity: 1 Low          Christene Lye, SPT Acute Rehabilitation Services [phone] Secure chat preferred    Christene Lye 05/17/2023, 1:07 PM

## 2023-05-17 NOTE — Discharge Summary (Signed)
Physician Discharge Summary  Patient ID: Lisa Burns MRN: [id number] DOB/AGE: June 28, 1961 62 y.o.  Admit date: 05/16/2023 Discharge date: 05/17/2023  Admission Diagnoses:  Lumbar radiculopathy  Discharge Diagnoses:  Same Principal Problem:   Lumbar radiculopathy   Discharged Condition: Stable  Hospital Course:  Lisa Burns is a 62 y.o. female who underwent L L3-4 MIS decompression for lumbar radiculopathy.  She was observed overnight in the hospital.  She was deemed ready for d/c on 6/27.  Pain was well-controlled, she was tolerating a diet, preop symptoms were improved.  Treatments: Surgery - L L3-4 MIS decompression  Discharge Exam: Blood pressure (!) 131/54, pulse 70, temperature 98.1 F (36.7 C), temperature source Oral, resp. rate 16, height 5' (1.524 m), weight 100.7 kg, SpO2 99 %. Awake, alert, oriented Speech fluent, appropriate CN grossly intact 5/5 BUE/BLE Wound c/d/i  Disposition: Discharge disposition: 01-Home or Self Care       Discharge Instructions     Incentive spirometry RT   Complete by: As directed       Allergies as of 05/17/2023       Reactions   Augmentin [amoxicillin-pot Clavulanate]    GI upset Did it involve swelling of the face/tongue/throat, SOB, or low BP? No Did it involve sudden or severe rash/hives, skin peeling, or any reaction on the inside of your mouth or nose? No Did you need to seek medical attention at a hospital or doctor's office? No When did it last happen?      15 years If all above answers are "NO", may proceed with cephalosporin use.   Cinnamon Hives, Swelling   Coconut Flavor Hives, Swelling   Januvia [sitagliptin]    GI upset   Metformin Diarrhea   GI Upset   Pravastatin    Pain in joints   Strawberry (diagnostic) Hives, Swelling        Medication List     TAKE these medications    albuterol 108 (90 Base) MCG/ACT inhaler Commonly known as: ProAir HFA INHALE TWO PUFFS INTO THE LUNGS EVERY SIX  HOURS AS NEEDED   aspirin EC 81 MG tablet Take 1 tablet (81 mg total) by mouth daily. Swallow whole.   Azelaic Acid 15 % gel Apply 1 application topically daily. Apply to face   clobetasol 0.05 % external solution Commonly known as: TEMOVATE Apply 1 application  topically 2 (two) times daily as needed (psoriasis).   cyclobenzaprine 10 MG tablet Commonly known as: FLEXERIL Take 10 mg by mouth 3 (three) times daily as needed for muscle spasms.   docusate sodium 100 MG capsule Commonly known as: Colace Take 1 capsule (100 mg total) by mouth 2 (two) times daily.   enalapril 20 MG tablet Commonly known as: VASOTEC TAKE ONE TABLET (20MG  TOTAL) BY MOUTH ATBEDTIME   Flovent HFA 110 MCG/ACT inhaler Generic drug: fluticasone INHALE TWO PUFFS INTO THE LUNGS TWICE DAILY What changed:  how much to take how to take this when to take this reasons to take this additional instructions   fluticasone 50 MCG/ACT nasal spray Commonly known as: FLONASE Place 1 spray into both nostrils daily.   furosemide 40 MG tablet Commonly known as: LASIX TAKE ONE TABLET (40MG  TOTAL) BY MOUTH EVERY MORNING   gabapentin 400 MG capsule Commonly known as: NEURONTIN TAKE ONE (1) CAPSULE THREE (3) TIMES EACH DAY What changed:  how much to take how to take this when to take this reasons to take this additional instructions   glucose blood test strip  Use as instructed   HYDROcodone-acetaminophen 5-325 MG tablet Commonly known as: NORCO/VICODIN Take 1-2 tablets by mouth every 6 (six) hours as needed for moderate pain ((score 4 to 6)).   isosorbide mononitrate 30 MG 24 hr tablet Commonly known as: IMDUR Take 1 tablet (30 mg total) by mouth daily.   lansoprazole 15 MG capsule Commonly known as: PREVACID Take 1 capsule (15 mg total) by mouth daily.   Lantus SoloStar 100 UNIT/ML Solostar Pen Generic drug: insulin glargine Inject 70 Units into the skin at bedtime.   loratadine 10 MG  tablet Commonly known as: CLARITIN Take 10 mg by mouth daily.   meloxicam 15 MG tablet Commonly known as: MOBIC Take 15 mg by mouth daily.   metoprolol succinate 25 MG 24 hr tablet Commonly known as: Toprol XL Take 1 tablet (25 mg total) by mouth daily.   NON FORMULARY Pt uses a cpap nightly   nortriptyline 50 MG capsule Commonly known as: PAMELOR TAKE TWO CAPSULES BY MOUTH AT BEDTIME   Repatha SureClick 140 MG/ML Soaj Generic drug: Evolocumab INJECT 140MG  INTO THE SKIN EVERY 14 DAYS   Trulicity 4.5 MG/0.5ML Sopn Generic drug: Dulaglutide Inject 4.5 mg as directed once a week.   valACYclovir 500 MG tablet Commonly known as: VALTREX Take 500 mg by mouth 2 (two) times daily as needed (cold sore flare ups).         Signed: Bedelia Person 05/17/2023, 1:09 PM

## 2023-05-17 NOTE — Progress Notes (Signed)
Patient HOB was elevated 30 degrees this morning at 7am. Patient tolerated sitting up in bed without any c/o headache or dizziness. Foley catheter removed without any complications. Will continue to monitor

## 2023-05-17 NOTE — Discharge Instructions (Signed)
Wound Care Remove dressing dressing in 3 days Leave incision open to air. You may shower. Do not scrub directly on incision.  Do not put any creams, lotions, or ointments on incision. Activity Walk each and every day, increasing distance each day. No lifting greater than 8 lbs.  Avoid bending, arching, and twisting. No driving for 2 weeks; may ride as a passenger locally.  Diet Resume your normal diet.  Return to Work Will be discussed at you follow up appointment. Call Your Doctor If Any of These Occur Redness, drainage, or swelling at the wound.  Temperature greater than 101 degrees. Severe pain not relieved by pain medication. Incision starts to come apart. Follow Up Appt Call [phone])  for problems.  If you have any hardware placed in your spine, you will need an x-ray before your appointment.

## 2023-05-25 DIAGNOSIS — M79605 Pain in left leg: Secondary | ICD-10-CM | POA: Diagnosis not present

## 2023-05-26 ENCOUNTER — Ambulatory Visit
Admission: EM | Admit: 2023-05-26 | Discharge: 2023-05-26 | Disposition: A | Discharge status: Home / Self Care | Payer: BC Managed Care – PPO

## 2023-05-26 DIAGNOSIS — S20212A Contusion of left front wall of thorax, initial encounter: Secondary | ICD-10-CM

## 2023-05-26 DIAGNOSIS — M545 Low back pain, unspecified: Secondary | ICD-10-CM

## 2023-05-26 DIAGNOSIS — S50311A Abrasion of right elbow, initial encounter: Secondary | ICD-10-CM | POA: Diagnosis not present

## 2023-05-26 DIAGNOSIS — M546 Pain in thoracic spine: Secondary | ICD-10-CM

## 2023-05-26 DIAGNOSIS — S7012XA Contusion of left thigh, initial encounter: Secondary | ICD-10-CM | POA: Diagnosis not present

## 2023-05-26 NOTE — ED Notes (Signed)
Wound cleaned with wound cleanser and and gauze, then sterile gauze was placed at the sight of the wound and wrapped with co band to keep guaze in place. Instructed patient and husband  on how to wrap wound at home. Went over written instructions given  by provider.   Pt and pt's husband verbalized understanding.
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
## 2023-05-26 NOTE — Discharge Instructions (Addendum)
Apply ice to the affected areas to help with bruising, swelling, pain, or discomfort. Warm Epsom salt soaks are recommended to help with soreness. Try to remain as active as possible. Elevate the lower extremities above the level of the heart to help with swelling. Keep the area on the right elbow cleaned with warm soap and water.  Cover the area with a bandage to prevent infection. Follow-up with your back surgeon as scheduled this week. Go to the emergency department immediately if you experience numbness, tingling, or weakness in your lower extremities or other concerns. Follow-up as needed.
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
## 2023-06-06 ENCOUNTER — Other Ambulatory Visit (HOSPITAL_COMMUNITY): Payer: Self-pay | Admitting: Sports Medicine

## 2023-06-06 ENCOUNTER — Ambulatory Visit (HOSPITAL_COMMUNITY)
Admission: RE | Admit: 2023-06-06 | Discharge: 2023-06-06 | Disposition: A | Discharge status: Home / Self Care | Payer: BC Managed Care – PPO | Source: Ambulatory Visit | Attending: Cardiology | Admitting: Cardiology

## 2023-06-06 DIAGNOSIS — M79605 Pain in left leg: Secondary | ICD-10-CM | POA: Diagnosis present

## 2023-06-06 DIAGNOSIS — S7012XA Contusion of left thigh, initial encounter: Secondary | ICD-10-CM | POA: Diagnosis not present

## 2023-06-06 DIAGNOSIS — L03116 Cellulitis of left lower limb: Secondary | ICD-10-CM | POA: Diagnosis not present

## 2023-06-07 DIAGNOSIS — T792XXA Traumatic secondary and recurrent hemorrhage and seroma, initial encounter: Secondary | ICD-10-CM | POA: Diagnosis not present

## 2023-06-12 ENCOUNTER — Other Ambulatory Visit: Payer: Self-pay | Admitting: Nurse Practitioner

## 2023-06-12 DIAGNOSIS — E119 Type 2 diabetes mellitus without complications: Secondary | ICD-10-CM

## 2023-06-14 ENCOUNTER — Encounter: Payer: Self-pay | Admitting: Nurse Practitioner

## 2023-07-03 DIAGNOSIS — L03116 Cellulitis of left lower limb: Secondary | ICD-10-CM | POA: Diagnosis not present

## 2023-07-03 DIAGNOSIS — T792XXA Traumatic secondary and recurrent hemorrhage and seroma, initial encounter: Secondary | ICD-10-CM | POA: Diagnosis not present

## 2023-07-20 ENCOUNTER — Emergency Department (HOSPITAL_COMMUNITY)
Admission: EM | Admit: 2023-07-20 | Discharge: 2023-07-20 | Disposition: A | Discharge status: Home / Self Care | Payer: BC Managed Care – PPO | Attending: Emergency Medicine | Admitting: Emergency Medicine

## 2023-07-20 ENCOUNTER — Other Ambulatory Visit: Payer: Self-pay

## 2023-07-20 ENCOUNTER — Encounter (HOSPITAL_COMMUNITY): Payer: Self-pay | Admitting: *Deleted

## 2023-07-20 DIAGNOSIS — Z7982 Long term (current) use of aspirin: Secondary | ICD-10-CM | POA: Insufficient documentation

## 2023-07-20 DIAGNOSIS — M79605 Pain in left leg: Secondary | ICD-10-CM

## 2023-07-20 DIAGNOSIS — I10 Essential (primary) hypertension: Secondary | ICD-10-CM | POA: Insufficient documentation

## 2023-07-20 DIAGNOSIS — S7012XD Contusion of left thigh, subsequent encounter: Secondary | ICD-10-CM | POA: Diagnosis not present

## 2023-07-20 DIAGNOSIS — Z79899 Other long term (current) drug therapy: Secondary | ICD-10-CM | POA: Diagnosis not present

## 2023-07-20 DIAGNOSIS — S79922D Unspecified injury of left thigh, subsequent encounter: Secondary | ICD-10-CM | POA: Diagnosis present

## 2023-07-20 DIAGNOSIS — M79662 Pain in left lower leg: Secondary | ICD-10-CM | POA: Diagnosis not present

## 2023-07-20 LAB — URINALYSIS, ROUTINE W REFLEX MICROSCOPIC
Bilirubin Urine: NEGATIVE
Glucose, UA: 50 mg/dL — AB
Hgb urine dipstick: NEGATIVE
Ketones, ur: NEGATIVE mg/dL
Nitrite: NEGATIVE
Protein, ur: NEGATIVE mg/dL
Specific Gravity, Urine: 1.016 (ref 1.005–1.030)
pH: 5 (ref 5.0–8.0)

## 2023-07-20 LAB — CBC
HCT: 35.8 % — ABNORMAL LOW (ref 36.0–46.0)
Hemoglobin: 11.7 g/dL — ABNORMAL LOW (ref 12.0–15.0)
MCH: 29.2 pg (ref 26.0–34.0)
MCHC: 32.7 g/dL (ref 30.0–36.0)
MCV: 89.3 fL (ref 80.0–100.0)
Platelets: 219 10*3/uL (ref 150–400)
RBC: 4.01 MIL/uL (ref 3.87–5.11)
RDW: 13.4 % (ref 11.5–15.5)
WBC: 5.7 10*3/uL (ref 4.0–10.5)
nRBC: 0 % (ref 0.0–0.2)

## 2023-07-20 LAB — COMPREHENSIVE METABOLIC PANEL
ALT: 23 U/L (ref 0–44)
AST: 17 U/L (ref 15–41)
Albumin: 3.7 g/dL (ref 3.5–5.0)
Alkaline Phosphatase: 120 U/L (ref 38–126)
Anion gap: 13 (ref 5–15)
BUN: 16 mg/dL (ref 8–23)
CO2: 25 mmol/L (ref 22–32)
Calcium: 9.2 mg/dL (ref 8.9–10.3)
Chloride: 97 mmol/L — ABNORMAL LOW (ref 98–111)
Creatinine, Ser: 1.05 mg/dL — ABNORMAL HIGH (ref 0.44–1.00)
GFR, Estimated: 60 mL/min — ABNORMAL LOW (ref >60)
Glucose, Bld: 250 mg/dL — ABNORMAL HIGH (ref 70–99)
Potassium: 3.2 mmol/L — ABNORMAL LOW (ref 3.5–5.1)
Sodium: 135 mmol/L (ref 135–145)
Total Bilirubin: 0.8 mg/dL (ref 0.3–1.2)
Total Protein: 6.5 g/dL (ref 6.5–8.1)

## 2023-07-20 LAB — LIPASE, BLOOD: Lipase: 30 U/L (ref 11–51)
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
## 2023-07-20 NOTE — Discharge Instructions (Addendum)
Return here in the morning for duplex.  If this is positive or has abnormal findings you will be directed back to the ED. If negative, please follow-up with orthopedics on Tuesday as scheduled.
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
## 2023-07-21 ENCOUNTER — Ambulatory Visit (HOSPITAL_COMMUNITY)
Admission: RE | Admit: 2023-07-21 | Discharge: 2023-07-21 | Disposition: A | Discharge status: Home / Self Care | Payer: BC Managed Care – PPO | Source: Ambulatory Visit | Attending: Emergency Medicine | Admitting: Emergency Medicine

## 2023-07-21 DIAGNOSIS — M7989 Other specified soft tissue disorders: Secondary | ICD-10-CM

## 2023-07-21 DIAGNOSIS — R600 Localized edema: Secondary | ICD-10-CM | POA: Diagnosis present

## 2023-07-21 DIAGNOSIS — M79662 Pain in left lower leg: Secondary | ICD-10-CM | POA: Insufficient documentation

## 2023-07-21 NOTE — Progress Notes (Signed)
VASCULAR LAB    Left lower extremity venous duplex has been performed.  See CV proc for preliminary results.   Ocie Tino, RVT 07/21/2023, 11:26 AM

## 2023-07-24 DIAGNOSIS — L03116 Cellulitis of left lower limb: Secondary | ICD-10-CM | POA: Diagnosis not present

## 2023-07-26 DIAGNOSIS — L03116 Cellulitis of left lower limb: Secondary | ICD-10-CM | POA: Diagnosis not present

## 2023-07-26 DIAGNOSIS — Z1231 Encounter for screening mammogram for malignant neoplasm of breast: Secondary | ICD-10-CM | POA: Diagnosis not present

## 2023-07-26 DIAGNOSIS — Z01419 Encounter for gynecological examination (general) (routine) without abnormal findings: Secondary | ICD-10-CM | POA: Diagnosis not present

## 2023-07-26 DIAGNOSIS — Z6841 Body Mass Index (BMI) 40.0 and over, adult: Secondary | ICD-10-CM | POA: Diagnosis not present

## 2023-08-14 DIAGNOSIS — L03116 Cellulitis of left lower limb: Secondary | ICD-10-CM | POA: Diagnosis not present

## 2023-08-14 DIAGNOSIS — T792XXA Traumatic secondary and recurrent hemorrhage and seroma, initial encounter: Secondary | ICD-10-CM | POA: Diagnosis not present

## 2023-08-30 DIAGNOSIS — S7012XA Contusion of left thigh, initial encounter: Secondary | ICD-10-CM | POA: Diagnosis not present

## 2023-08-30 DIAGNOSIS — S50351A Superficial foreign body of right elbow, initial encounter: Secondary | ICD-10-CM | POA: Diagnosis not present

## 2023-09-05 DIAGNOSIS — S50351A Superficial foreign body of right elbow, initial encounter: Secondary | ICD-10-CM | POA: Diagnosis not present

## 2023-09-06 ENCOUNTER — Telehealth: Payer: Self-pay | Admitting: *Deleted

## 2023-09-06 NOTE — Telephone Encounter (Signed)
Pt has been scheduled for tele pre op appt 09/19/23. Med rec and consent are done.     Patient Consent for Virtual Visit        KENIJAH BENNINGFIELD has provided verbal consent on 09/06/2023 for a virtual visit (video or telephone).   CONSENT FOR VIRTUAL VISIT FOR:  Lisa Burns  By participating in this virtual visit I agree to the following:  I hereby voluntarily request, consent and authorize [facility] and its employed or contracted physicians, physician assistants, nurse practitioners or other licensed health care professionals (the Practitioner), to provide me with telemedicine health care services (the "Services") as deemed necessary by the treating Practitioner. I acknowledge and consent to receive the Services by the Practitioner via telemedicine. I understand that the telemedicine visit will involve communicating with the Practitioner through live audiovisual communication technology and the disclosure of certain medical information by electronic transmission. I acknowledge that I have been given the opportunity to request an in-person assessment or other available alternative prior to the telemedicine visit and am voluntarily participating in the telemedicine visit.  I understand that I have the right to withhold or withdraw my consent to the use of telemedicine in the course of my care at any time, without affecting my right to future care or treatment, and that the Practitioner or I may terminate the telemedicine visit at any time. I understand that I have the right to inspect all information obtained and/or recorded in the course of the telemedicine visit and may receive copies of available information for a reasonable fee.  I understand that some of the potential risks of receiving the Services via telemedicine include:  Delay or interruption in medical evaluation due to technological equipment failure or disruption; Information transmitted may not be sufficient (e.g. poor  resolution of images) to allow for appropriate medical decision making by the Practitioner; and/or  In rare instances, security protocols could fail, causing a breach of personal health information.  Furthermore, I acknowledge that it is my responsibility to provide information about my medical history, conditions and care that is complete and accurate to the best of my ability. I acknowledge that Practitioner's advice, recommendations, and/or decision may be based on factors not within their control, such as incomplete or inaccurate data provided by me or distortions of diagnostic images or specimens that may result from electronic transmissions. I understand that the practice of medicine is not an exact science and that Practitioner makes no warranties or guarantees regarding treatment outcomes. I acknowledge that a copy of this consent can be made available to me via my patient portal [facility] MyChart), or I can request a printed copy by calling the office of [facility].    I understand that my insurance will be billed for this visit.   I have read or had this consent read to me. I understand the contents of this consent, which adequately explains the benefits and risks of the Services being provided via telemedicine.  I have been provided ample opportunity to ask questions regarding this consent and the Services and have had my questions answered to my satisfaction. I give my informed consent for the services to be provided through the use of telemedicine in my medical care

## 2023-09-06 NOTE — Telephone Encounter (Signed)
   Name: NADELYN ENRIQUES  DOB: 02-25-1961  MRN: [id number]  Primary Cardiologist: Donato Schultz, MD  Chart reviewed as part of pre-operative protocol coverage. Because of Vestal Crandall Bello's past medical history and time since last visit, she will require a follow-up telephone visit in order to better assess preoperative cardiovascular risk.  Pre-op covering staff: - Please schedule appointment and call patient to inform them. If patient already had an upcoming appointment within acceptable timeframe, please add "pre-op clearance" to the appointment notes so provider is aware. - Please contact requesting surgeon's office via preferred method (i.e, phone, fax) to inform them of need for appointment prior to surgery.  Patient can hold aspirin x 7 days prior to procedure and restart when medically safe if asymptomatic at the time of phone call.    Sharlene Dory, PA-C  09/06/2023, 2:39 PM

## 2023-09-06 NOTE — Telephone Encounter (Signed)
Pt has been scheduled for tele pre op appt 09/19/23. Med rec and consent are done.

## 2023-09-06 NOTE — Telephone Encounter (Signed)
   Pre-operative Risk Assessment    Patient Name: Lisa Burns  DOB: October 01, 1961 MRN: [id number]    DATE OF LAST VISIT: 05/08/23 Robin Searing, NP DATE OF NEXT VISIT: NONE  Request for Surgical Clearance    Procedure:   RIGHT ELBOW FOREIGN BODY EXCISION, ULNAR NERVE  TRANSPOSITION  Date of Surgery:  Clearance TBD                                 Surgeon:  DR. Margarita Rana Surgeon's Group or Practice Name:  [facility] Phone number:  [phone] EXT [address] Fax number:  [phone]   Type of Clearance Requested:   - Medical  - Pharmacy:  Hold Aspirin     Type of Anesthesia:   CHOICE   Additional requests/questions:    Elpidio Anis   09/06/2023, 2:10 PM

## 2023-09-10 DIAGNOSIS — E785 Hyperlipidemia, unspecified: Secondary | ICD-10-CM | POA: Diagnosis not present

## 2023-09-10 DIAGNOSIS — E119 Type 2 diabetes mellitus without complications: Secondary | ICD-10-CM | POA: Diagnosis not present

## 2023-09-10 LAB — PROTEIN / CREATININE RATIO, URINE
Albumin, U: 156.1
Creatinine, Urine: 93.6

## 2023-09-10 LAB — CBC AND DIFFERENTIAL
HCT: 38 (ref 36–46)
Hemoglobin: 11.6 — AB (ref 12.0–16.0)
Platelets: 207 10*3/uL (ref 150–400)
WBC: 4.9

## 2023-09-10 LAB — COMPREHENSIVE METABOLIC PANEL
Albumin: 4.1 (ref 3.5–5.0)
Calcium: 9.1 (ref 8.7–10.7)
EGFR: 79
Globulin: 1.9

## 2023-09-10 LAB — MICROALBUMIN / CREATININE URINE RATIO: Microalb Creat Ratio: 167

## 2023-09-10 LAB — CBC: RBC: 4.16 (ref 3.87–5.11)

## 2023-09-10 LAB — BASIC METABOLIC PANEL
BUN: 13 (ref 4–21)
CO2: 25 — AB (ref 13–22)
Chloride: 96 — AB (ref 99–108)
Creatinine: 0.8 (ref 0.5–1.1)
Glucose: 321
Potassium: 3.4 meq/L — AB (ref 3.5–5.1)
Sodium: 137 (ref 137–147)

## 2023-09-10 LAB — LIPID PANEL
Cholesterol: 122 (ref 0–200)
HDL: 56 (ref 35–70)
LDL Cholesterol: 41
LDl/HDL Ratio: 2.2
Triglycerides: 147 (ref 40–160)

## 2023-09-10 LAB — HEPATIC FUNCTION PANEL
ALT: 20 U/L (ref 7–35)
AST: 16 (ref 13–35)
Alkaline Phosphatase: 141 — AB (ref 25–125)
Bilirubin, Total: 0.4

## 2023-09-10 LAB — HEMOGLOBIN A1C: Hemoglobin A1C: 10.8

## 2023-09-11 DIAGNOSIS — H04121 Dry eye syndrome of right lacrimal gland: Secondary | ICD-10-CM | POA: Diagnosis not present

## 2023-09-11 DIAGNOSIS — H9209 Otalgia, unspecified ear: Secondary | ICD-10-CM | POA: Diagnosis not present

## 2023-09-11 DIAGNOSIS — H5789 Other specified disorders of eye and adnexa: Secondary | ICD-10-CM | POA: Diagnosis not present

## 2023-09-11 DIAGNOSIS — J302 Other seasonal allergic rhinitis: Secondary | ICD-10-CM | POA: Diagnosis not present

## 2023-09-11 DIAGNOSIS — E119 Type 2 diabetes mellitus without complications: Secondary | ICD-10-CM | POA: Diagnosis not present

## 2023-09-11 DIAGNOSIS — H9203 Otalgia, bilateral: Secondary | ICD-10-CM | POA: Diagnosis not present

## 2023-09-11 DIAGNOSIS — Z01812 Encounter for preprocedural laboratory examination: Secondary | ICD-10-CM | POA: Diagnosis not present

## 2023-09-19 ENCOUNTER — Encounter: Payer: Self-pay | Admitting: Nurse Practitioner

## 2023-09-19 ENCOUNTER — Ambulatory Visit (INDEPENDENT_AMBULATORY_CARE_PROVIDER_SITE_OTHER): Payer: BC Managed Care – PPO | Admitting: Nurse Practitioner

## 2023-09-19 ENCOUNTER — Ambulatory Visit: Payer: BC Managed Care – PPO | Attending: Nurse Practitioner

## 2023-09-19 VITALS — BP 140/80 | HR 68 | Ht 60.0 in | Wt 228.6 lb

## 2023-09-19 DIAGNOSIS — Z794 Long term (current) use of insulin: Secondary | ICD-10-CM | POA: Diagnosis not present

## 2023-09-19 DIAGNOSIS — I10 Essential (primary) hypertension: Secondary | ICD-10-CM

## 2023-09-19 DIAGNOSIS — Z01810 Encounter for preprocedural cardiovascular examination: Secondary | ICD-10-CM

## 2023-09-19 DIAGNOSIS — E782 Mixed hyperlipidemia: Secondary | ICD-10-CM | POA: Diagnosis not present

## 2023-09-19 DIAGNOSIS — E119 Type 2 diabetes mellitus without complications: Secondary | ICD-10-CM

## 2023-09-19 MED ORDER — TIRZEPATIDE 2.5 MG/0.5ML ~~LOC~~ SOAJ: 2.5000 mg | SUBCUTANEOUS | 0 refills | Status: DC

## 2023-09-19 MED ORDER — TIRZEPATIDE 5 MG/0.5ML ~~LOC~~ SOAJ: 5.0000 mg | SUBCUTANEOUS | 1 refills | Status: DC
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
## 2023-09-19 NOTE — Progress Notes (Signed)
Virtual Visit via Telephone Note   Because of Lisa Burns's co-morbid illnesses, she is at least at moderate risk for complications without adequate follow up.  This format is felt to be most appropriate for this patient at this time.  The patient did not have access to video technology/had technical difficulties with video requiring transitioning to audio format only (telephone).  All issues noted in this document were discussed and addressed.  No physical exam could be performed with this format.  Please refer to the patient's chart for her consent to telehealth for [facility].  Evaluation Performed:  Preoperative cardiovascular risk assessment _____________   Date:  09/19/2023   Patient ID:  Lisa Burns, DOB 07/11/1961, MRN [id number] Patient Location:  Home Provider location:   Office  Primary Care Provider:  Benita Stabile, MD Primary Cardiologist:  Donato Schultz, MD  Chief Complaint / Patient Profile   62 y.o. y/o female with a h/o CAD, aortic calcification,family history of CAD, DM type II, HLD, HTN, murmur,  Former tobacco abuser  who is pending excision of foreign body from right elbow with ulnar nerve transposition and presents today for telephonic preoperative cardiovascular risk assessment.  History of Present Illness    Lisa Burns is a 62 y.o. female who presents via audio/video conferencing for a telehealth visit today.  Pt was last seen in cardiology clinic on 05/08/2023 by Robin Searing, NP. At that time Lisa Burns was doing well with no new cardiac complaints.  The patient is now pending procedure as outlined above. Since her last visit, she has been doing well with no new cardiac complaints since her previous follow-up.  She denies chest pain, shortness of breath, lower extremity edema, fatigue, palpitations, melena, hematuria, hemoptysis, diaphoresis, weakness, presyncope, syncope, orthopnea, and PND.    Past Medical History    Past Medical History:   Diagnosis Date   Acute pharyngitis    Allergy    Anemia    Arthritis    Asthma    Constipation    Cough    Diabetes mellitus with neuropathy (HCC)    Diabetes mellitus without complication (HCC)    GERD (gastroesophageal reflux disease)    Heart murmur    Heart murmur    History of hiatal hernia    Sx in 2000   Hyperlipidemia    Hypertension    Microproteinuria    Neuropathy    Obesity    OSA (obstructive sleep apnea)    Proteinuria    Reflux    Snoring    Ulcer of mouth    URI (upper respiratory infection)    Venous stasis    Past Surgical History:  Procedure Laterality Date   CESAREAN SECTION  1986   COLONOSCOPY N/A 04/24/2016   Procedure: COLONOSCOPY;  Surgeon: West Bali, MD;  Location: [facility];  Service: Endoscopy;  Laterality: N/A;  8:30 Am   COLONOSCOPY N/A 07/18/2019   Dr. Darrick Penna: 6 simple adenomas removed.  External/internal hemorrhoids.  Next colonoscopy in 3 years.   LAPAROSCOPIC NISSEN FUNDOPLICATION  2000   LUMBAR LAMINECTOMY/ DECOMPRESSION WITH MET-RX Left 05/16/2023   Procedure: LUMBAR THREE-LUMBAR FOUR LAMINOTOMY, MEDICAL FACETECTOMY AND FORAMINOTOMY;  Surgeon: Bedelia Person, MD;  Location: [facility] OR;  Service: Neurosurgery;  Laterality: Left;  3C   OTHER SURGICAL HISTORY     gerd surgery   POLYPECTOMY  07/18/2019   Procedure: POLYPECTOMY;  Surgeon: West Bali, MD;  Location: [facility];  Service: Endoscopy;;   THROAT SURGERY  2000   TONSILLECTOMY     As a child    Allergies  Allergies  Allergen Reactions   Augmentin [Amoxicillin-Pot Clavulanate]     GI upset Did it involve swelling of the face/tongue/throat, SOB, or low BP? No Did it involve sudden or severe rash/hives, skin peeling, or any reaction on the inside of your mouth or nose? No Did you need to seek medical attention at a hospital or doctor's office? No When did it last happen?      15 years If all above answers are "NO", may proceed with cephalosporin use.     Cinnamon Hives and Swelling   Coconut Flavor Hives and Swelling   Januvia [Sitagliptin]     GI upset   Metformin Diarrhea    GI Upset   Pravastatin     Pain in joints   Strawberry (Diagnostic) Hives and Swelling    Home Medications    Prior to Admission medications   Medication Sig Start Date End Date Taking? Authorizing Provider  albuterol (PROAIR HFA) 108 (90 Base) MCG/ACT inhaler INHALE TWO PUFFS INTO THE LUNGS EVERY SIX HOURS AS NEEDED 10/29/20   Laroy Apple M, DO  aspirin EC 81 MG tablet Take 1 tablet (81 mg total) by mouth daily. Swallow whole. 06/13/22   Jake Bathe, MD  Azelaic Acid 15 % cream Apply 1 application topically daily. Apply to face 02/14/19   [provider]  clobetasol (TEMOVATE) 0.05 % external solution Apply 1 application  topically 2 (two) times daily as needed (psoriasis). 02/12/19   [provider]  cyclobenzaprine (FLEXERIL) 10 MG tablet Take 10 mg by mouth 3 (three) times daily as needed for muscle spasms. 03/14/23   [provider]  docusate sodium (COLACE) 100 MG capsule Take 1 capsule (100 mg total) by mouth 2 (two) times daily. Patient not taking: Reported on 09/06/2023 05/17/23 05/16/24  Bedelia Person, MD  Dulaglutide (TRULICITY) 4.5 MG/0.5ML SOPN Inject 4.5 mg as directed once a week. Patient not taking: Reported on 09/06/2023 09/13/22   Dani Gobble, NP  enalapril (VASOTEC) 20 MG tablet TAKE ONE TABLET (20MG  TOTAL) BY MOUTH ATBEDTIME 05/13/21   Ladona Ridgel, Malena M, DO  Evolocumab (REPATHA SURECLICK) 140 MG/ML SOAJ INJECT 140MG  INTO THE SKIN EVERY 14 DAYS 09/13/22   Dani Gobble, NP  fluticasone (FLONASE) 50 MCG/ACT nasal spray Place 1 spray into both nostrils daily. Patient not taking: Reported on 09/06/2023 12/27/17   Merlyn Albert, MD  fluticasone (FLOVENT HFA) 110 MCG/ACT inhaler INHALE TWO PUFFS INTO THE LUNGS TWICE DAILY Patient taking differently: Inhale 2 puffs into the lungs 2 (two) times daily as needed  (shortness of wheezing). 10/29/20   Ladona Ridgel, Malena M, DO  furosemide (LASIX) 40 MG tablet TAKE ONE TABLET (40MG  TOTAL) BY MOUTH EVERY MORNING 07/04/21   Ladona Ridgel, Malena M, DO  gabapentin (NEURONTIN) 400 MG capsule TAKE ONE (1) CAPSULE THREE (3) TIMES EACH DAY Patient taking differently: Take 400 mg by mouth 3 (three) times daily as needed (pain). 10/29/20   Laroy Apple M, DO  glucose blood test strip Use as instructed Patient not taking: Reported on 09/06/2023 02/26/13   Merlyn Albert, MD  HYDROcodone-acetaminophen (NORCO/VICODIN) 5-325 MG tablet Take 1-2 tablets by mouth every 6 (six) hours as needed for moderate pain ((score 4 to 6)). Patient not taking: Reported on 09/06/2023 05/17/23   Bedelia Person, MD  isosorbide mononitrate (IMDUR) 30 MG 24 hr tablet  Take 1 tablet (30 mg total) by mouth daily. 05/08/23   Gaston Islam., NP  lansoprazole (PREVACID) 15 MG capsule Take 1 capsule (15 mg total) by mouth daily. 10/29/20   Ladona Ridgel, Malena M, DO  LANTUS SOLOSTAR 100 UNIT/ML Solostar Pen INJECT 70 UNITS INTO THE SKIN AT BEDTIME 06/12/23   Dani Gobble, NP  loratadine (CLARITIN) 10 MG tablet Take 10 mg by mouth daily.     [provider]  meloxicam (MOBIC) 15 MG tablet Take 15 mg by mouth daily. Patient not taking: Reported on 09/06/2023    [provider]  metoprolol succinate (TOPROL XL) 25 MG 24 hr tablet Take 1 tablet (25 mg total) by mouth daily. 05/08/23   Gaston Islam., NP  NON FORMULARY Pt uses a cpap nightly    [provider]  nortriptyline (PAMELOR) 50 MG capsule TAKE TWO CAPSULES BY MOUTH AT BEDTIME 05/13/21   Ladona Ridgel, Malena M, DO  valACYclovir (VALTREX) 500 MG tablet Take 500 mg by mouth 2 (two) times daily as needed (cold sore flare ups).  03/05/13   [provider]    Physical Exam    Vital Signs:  Lisa Burns does not have vital signs available for review today.124/74  Given telephonic nature of communication, physical exam is  limited. AAOx3. NAD. Normal affect.  Speech and respirations are unlabored.  Accessory Clinical Findings    None  Assessment & Plan    1.  Preoperative Cardiovascular Risk Assessment: -Patient's RCRI score is 6.6%  The patient affirms she has been doing well without any new cardiac symptoms. They are able to achieve 7 METS without cardiac limitations. Therefore, based on ACC/AHA guidelines, the patient would be at acceptable risk for the planned procedure without further cardiovascular testing. The patient was advised that if she develops new symptoms prior to surgery to contact our office to arrange for a follow-up visit, and she verbalized understanding.   The patient was advised that if she develops new symptoms prior to surgery to contact our office to arrange for a follow-up visit, and she verbalized understanding.  Patient can hold aspirin 7 days prior to procedure and should restart postprocedure when surgically safe.  A copy of this note will be routed to requesting surgeon.  Time:   Today, I have spent 6 minutes with the patient with telehealth technology discussing medical history, symptoms, and management plan.     Napoleon Form, Leodis Rains, NP  09/19/2023, 7:15 AM

## 2023-09-21 ENCOUNTER — Other Ambulatory Visit: Payer: Self-pay | Admitting: Nurse Practitioner

## 2023-09-25 ENCOUNTER — Other Ambulatory Visit: Payer: Self-pay | Admitting: Nurse Practitioner

## 2023-10-02 ENCOUNTER — Telehealth: Payer: Self-pay | Admitting: *Deleted

## 2023-10-02 NOTE — Telephone Encounter (Signed)
Patient left a message that the [facility] she has not received yet. [facility] could not use the Rx Coupon and they sent it to the [facility] in [place]. She shares that they have not heard anything.  Patient was advised that we would send this over to the  Rx PA department to see if they had seen anything, and to ask them to be on the look out for it.

## 2023-10-04 ENCOUNTER — Other Ambulatory Visit (HOSPITAL_COMMUNITY): Payer: Self-pay

## 2023-10-04 ENCOUNTER — Telehealth: Payer: Self-pay | Admitting: *Deleted

## 2023-10-04 NOTE — Telephone Encounter (Signed)
Patient left a message about her insurance. She states that she verified this and was told that her ID # was [id number].. Insurance was the same as she did not change a thing when she signed up on Sunday, November 19,2024.  Will send this to the RX PA team to see if this helps.

## 2023-10-04 NOTE — Telephone Encounter (Signed)
Patient was called and made aware. She states that on Sunday,November 10 she updated her Insurance. Everything stayed the same. Except the company is now doing 70/30 verses 80/20. She is going to reach out to them and see what the difference may be.

## 2023-10-04 NOTE — Telephone Encounter (Signed)
I started a PA in CMM through [employer] PA was cancelled by the insurance with this note:     No other insurance was found. Please advise pt that we need updated insurance info.

## 2023-10-05 ENCOUNTER — Other Ambulatory Visit (HOSPITAL_COMMUNITY): Payer: Self-pay

## 2023-10-06 ENCOUNTER — Other Ambulatory Visit: Payer: Self-pay | Admitting: Nurse Practitioner

## 2023-10-08 NOTE — Telephone Encounter (Signed)
Patient was called and a message was left letting the patient know.

## 2023-10-10 ENCOUNTER — Other Ambulatory Visit: Payer: Self-pay | Admitting: *Deleted

## 2023-10-10 DIAGNOSIS — E119 Type 2 diabetes mellitus without complications: Secondary | ICD-10-CM

## 2023-10-10 MED ORDER — TIRZEPATIDE 2.5 MG/0.5ML ~~LOC~~ SOAJ
2.5000 mg | SUBCUTANEOUS | 0 refills | Status: DC
Start: 2023-10-10 — End: 2024-03-11
  Filled 2023-10-29: qty 2, 28d supply, fill #0

## 2023-10-10 MED ORDER — TIRZEPATIDE 5 MG/0.5ML ~~LOC~~ SOAJ
5.0000 mg | SUBCUTANEOUS | 1 refills | Status: DC
Start: 2023-10-17 — End: 2024-03-11
  Filled 2023-11-26: qty 2, 28d supply, fill #0
  Filled 2023-12-26: qty 2, 28d supply, fill #1
  Filled 2024-01-21: qty 2, 28d supply, fill #2
  Filled 2024-02-22 (×2): qty 2, 28d supply, fill #3

## 2023-10-10 NOTE — Telephone Encounter (Signed)
Patient states that she has checked with her insurance and pharmacy. Her insurance is the same. The problem may be that the prescription needs to be sent to another pharmacy. She has ask that we sent the prescription to [facility]. This will be done.

## 2023-10-10 NOTE — Telephone Encounter (Signed)
A prescription has been sent in for the Mounjaro 0.25 and .05 mg. She will not start the 0.5 until she has been on the 0.25 mg for 1 month.

## 2023-10-22 ENCOUNTER — Telehealth: Payer: Self-pay

## 2023-10-22 NOTE — Telephone Encounter (Signed)
Pharmacy Patient Advocate Encounter   Received notification from CoverMyMeds that prior authorization for [facility] is required/requested.   Insurance verification completed.   The patient is insured through [employer] .   Per test claim: PA required; PA submitted to above mentioned insurance via CoverMyMeds Key/confirmation #/EOC [id number] Status is pending

## 2023-10-23 NOTE — Telephone Encounter (Signed)
FYI Called pt back to let her know what's going on with e PA. Its pending and insurance gave a turn around time of 24hrs to 5 days.

## 2023-10-24 NOTE — Telephone Encounter (Signed)
Noted  

## 2023-10-26 NOTE — Telephone Encounter (Signed)
Pharmacy Patient Advocate Encounter  Received notification from [facility] that Prior Authorization for Lisa Burns has been APPROVED from 10/24/23 to 10/22/24   PA #/Case ID/Reference #: [id number]

## 2023-10-29 ENCOUNTER — Other Ambulatory Visit (HOSPITAL_BASED_OUTPATIENT_CLINIC_OR_DEPARTMENT_OTHER): Payer: Self-pay

## 2023-10-29 NOTE — Telephone Encounter (Signed)
Patient was called and made aware. 

## 2023-10-30 ENCOUNTER — Other Ambulatory Visit (HOSPITAL_BASED_OUTPATIENT_CLINIC_OR_DEPARTMENT_OTHER): Payer: Self-pay

## 2023-11-26 ENCOUNTER — Other Ambulatory Visit (HOSPITAL_BASED_OUTPATIENT_CLINIC_OR_DEPARTMENT_OTHER): Payer: Self-pay

## 2023-12-25 ENCOUNTER — Ambulatory Visit: Payer: BC Managed Care – PPO | Admitting: Nurse Practitioner

## 2023-12-26 ENCOUNTER — Other Ambulatory Visit (HOSPITAL_BASED_OUTPATIENT_CLINIC_OR_DEPARTMENT_OTHER): Payer: Self-pay

## 2024-01-21 DIAGNOSIS — Z794 Long term (current) use of insulin: Secondary | ICD-10-CM | POA: Diagnosis not present

## 2024-01-21 DIAGNOSIS — H04123 Dry eye syndrome of bilateral lacrimal glands: Secondary | ICD-10-CM | POA: Diagnosis not present

## 2024-01-21 DIAGNOSIS — H2513 Age-related nuclear cataract, bilateral: Secondary | ICD-10-CM | POA: Diagnosis not present

## 2024-01-21 DIAGNOSIS — E119 Type 2 diabetes mellitus without complications: Secondary | ICD-10-CM | POA: Diagnosis not present

## 2024-01-21 LAB — HM DIABETES EYE EXAM

## 2024-01-22 DIAGNOSIS — S50351D Superficial foreign body of right elbow, subsequent encounter: Secondary | ICD-10-CM | POA: Diagnosis not present

## 2024-01-24 ENCOUNTER — Other Ambulatory Visit (HOSPITAL_BASED_OUTPATIENT_CLINIC_OR_DEPARTMENT_OTHER): Payer: Self-pay

## 2024-01-29 DIAGNOSIS — E65 Localized adiposity: Secondary | ICD-10-CM | POA: Diagnosis not present

## 2024-02-20 DIAGNOSIS — I872 Venous insufficiency (chronic) (peripheral): Secondary | ICD-10-CM | POA: Diagnosis not present

## 2024-02-20 DIAGNOSIS — L218 Other seborrheic dermatitis: Secondary | ICD-10-CM | POA: Diagnosis not present

## 2024-02-20 DIAGNOSIS — L820 Inflamed seborrheic keratosis: Secondary | ICD-10-CM | POA: Diagnosis not present

## 2024-02-22 ENCOUNTER — Other Ambulatory Visit (HOSPITAL_BASED_OUTPATIENT_CLINIC_OR_DEPARTMENT_OTHER): Payer: Self-pay

## 2024-02-25 ENCOUNTER — Other Ambulatory Visit (HOSPITAL_COMMUNITY): Payer: Self-pay

## 2024-03-06 DIAGNOSIS — E785 Hyperlipidemia, unspecified: Secondary | ICD-10-CM | POA: Diagnosis not present

## 2024-03-06 DIAGNOSIS — E119 Type 2 diabetes mellitus without complications: Secondary | ICD-10-CM | POA: Diagnosis not present

## 2024-03-07 LAB — HEMOGLOBIN A1C
EGFR: 70
Hemoglobin A1C: 7

## 2024-03-11 ENCOUNTER — Other Ambulatory Visit (HOSPITAL_BASED_OUTPATIENT_CLINIC_OR_DEPARTMENT_OTHER): Payer: Self-pay

## 2024-03-11 ENCOUNTER — Ambulatory Visit: Payer: BC Managed Care – PPO | Admitting: Nurse Practitioner

## 2024-03-11 ENCOUNTER — Encounter: Payer: Self-pay | Admitting: Nurse Practitioner

## 2024-03-11 VITALS — BP 130/72 | HR 75 | Ht 60.0 in | Wt 214.0 lb

## 2024-03-11 DIAGNOSIS — E119 Type 2 diabetes mellitus without complications: Secondary | ICD-10-CM | POA: Diagnosis not present

## 2024-03-11 DIAGNOSIS — Z794 Long term (current) use of insulin: Secondary | ICD-10-CM | POA: Diagnosis not present

## 2024-03-11 DIAGNOSIS — M545 Low back pain, unspecified: Secondary | ICD-10-CM | POA: Diagnosis not present

## 2024-03-11 DIAGNOSIS — I10 Essential (primary) hypertension: Secondary | ICD-10-CM | POA: Diagnosis not present

## 2024-03-11 DIAGNOSIS — E782 Mixed hyperlipidemia: Secondary | ICD-10-CM | POA: Diagnosis not present

## 2024-03-11 DIAGNOSIS — S50351D Superficial foreign body of right elbow, subsequent encounter: Secondary | ICD-10-CM | POA: Diagnosis not present

## 2024-03-11 MED ORDER — TIRZEPATIDE 5 MG/0.5ML ~~LOC~~ SOAJ
5.0000 mg | SUBCUTANEOUS | 1 refills | Status: DC
Start: 2024-03-11 — End: 2024-09-30
  Filled 2024-03-11 – 2024-03-24 (×2): qty 6, 84d supply, fill #0
  Filled 2024-06-30: qty 6, 84d supply, fill #1

## 2024-03-11 MED ORDER — LANTUS SOLOSTAR 100 UNIT/ML ~~LOC~~ SOPN
50.0000 [IU] | PEN_INJECTOR | Freq: Every day | SUBCUTANEOUS | Status: DC
Start: 2024-03-11 — End: 2024-06-17

## 2024-03-11 NOTE — Progress Notes (Signed)
 03/11/2024                     Endocrinology follow-up note   Subjective:    Patient ID: Lisa Burns, female    DOB: 03-22-1961, PCP Omie Bickers, MD   Past Medical History:  Diagnosis Date   Acute pharyngitis    Allergy    Anemia    Arthritis    Asthma    Constipation    Cough    Diabetes mellitus with neuropathy (HCC)    Diabetes mellitus without complication (HCC)    GERD (gastroesophageal reflux disease)    Heart murmur    Heart murmur    History of hiatal hernia    Sx in 2000   Hyperlipidemia    Hypertension    Microproteinuria    Neuropathy    Obesity    OSA (obstructive sleep apnea)    Proteinuria    Reflux    Snoring    Ulcer of mouth    URI (upper respiratory infection)    Venous stasis    Past Surgical History:  Procedure Laterality Date   CESAREAN SECTION  1986   COLONOSCOPY N/A 04/24/2016   Procedure: COLONOSCOPY;  Surgeon: Alyce Jubilee, MD;  Location: [facility];  Service: Endoscopy;  Laterality: N/A;  8:30 Am   COLONOSCOPY N/A 07/18/2019   Dr. Nolene Baumgarten: 6 simple adenomas removed.  External/internal hemorrhoids.  Next colonoscopy in 3 years.   LAPAROSCOPIC NISSEN FUNDOPLICATION  2000   LUMBAR LAMINECTOMY/ DECOMPRESSION WITH MET-RX Left 05/16/2023   Procedure: LUMBAR THREE-LUMBAR FOUR LAMINOTOMY, MEDICAL FACETECTOMY AND FORAMINOTOMY;  Surgeon: Van Gelinas, MD;  Location: [facility] OR;  Service: Neurosurgery;  Laterality: Left;  3C   OTHER SURGICAL HISTORY     gerd surgery   POLYPECTOMY  07/18/2019   Procedure: POLYPECTOMY;  Surgeon: Alyce Jubilee, MD;  Location: [facility];  Service: Endoscopy;;   THROAT SURGERY  2000   TONSILLECTOMY     As a child   Social History   Socioeconomic History   Marital status: Married    Spouse name: Not on file   Number of children: 1   Years of education: HS   Highest education level: Not on file  Occupational History   Occupation: [employer]   Tobacco Use   Smoking status: Former     Current packs/day: 0.00    Average packs/day: 1 pack/day for 5.0 years (5.0 ttl pk-yrs)    Types: Cigarettes    Start date: 1999    Quit date: 2004    Years since quitting: 21.3   Smokeless tobacco: Never  Vaping Use   Vaping status: Never Used  Substance and Sexual Activity   Alcohol use: Yes    Alcohol/week: 0.0 standard drinks of alcohol    Comment: occasionally   Drug use: No   Sexual activity: Yes    Birth control/protection: None  Other Topics Concern   Not on file  Social History Narrative   Drinks 2 cans of soda a day    Social Drivers of Corporate investment banker Strain: Not on file  Food Insecurity: Not on file  Transportation Needs: Not on file  Physical Activity: Not on file  Stress: Not on file  Social Connections: Not on file   Outpatient Encounter Medications as of 03/11/2024  Medication Sig   albuterol  (PROAIR  HFA) 108 (90 Base) MCG/ACT inhaler INHALE TWO PUFFS INTO THE LUNGS EVERY SIX HOURS AS  NEEDED   aspirin  EC 81 MG tablet Take 1 tablet (81 mg total) by mouth daily. Swallow whole.   Azelaic Acid  15 % cream Apply 1 application topically daily. Apply to face   clobetasol  (TEMOVATE ) 0.05 % external solution Apply 1 application  topically 2 (two) times daily as needed (psoriasis).   cyclobenzaprine  (FLEXERIL ) 10 MG tablet Take 10 mg by mouth 3 (three) times daily as needed for muscle spasms.   enalapril  (VASOTEC ) 20 MG tablet TAKE ONE TABLET (20MG  TOTAL) BY MOUTH ATBEDTIME   furosemide  (LASIX ) 40 MG tablet TAKE ONE TABLET (40MG  TOTAL) BY MOUTH EVERY MORNING   gabapentin  (NEURONTIN ) 400 MG capsule TAKE ONE (1) CAPSULE THREE (3) TIMES EACH DAY (Patient taking differently: Take 400 mg by mouth 3 (three) times daily as needed (pain).)   glucose blood test strip Use as instructed   isosorbide  mononitrate (IMDUR ) 30 MG 24 hr tablet TAKE ONE TABLET (30MG  TOTAL) BY MOUTH DAILY   lansoprazole  (PREVACID ) 15 MG capsule Take 1 capsule (15 mg total) by mouth daily.    loratadine (CLARITIN) 10 MG tablet Take 10 mg by mouth daily.    metoprolol  succinate (TOPROL  XL) 25 MG 24 hr tablet Take 1 tablet (25 mg total) by mouth daily.   NON FORMULARY Pt uses a cpap nightly   nortriptyline  (PAMELOR ) 50 MG capsule TAKE TWO CAPSULES BY MOUTH AT BEDTIME   REPATHA  SURECLICK 140 MG/ML SOAJ INJECT 140MG  INTO THE SKIN EVERY 14 DAYS   valACYclovir  (VALTREX ) 500 MG tablet Take 500 mg by mouth 2 (two) times daily as needed (cold sore flare ups).    [DISCONTINUED] LANTUS  SOLOSTAR 100 UNIT/ML Solostar Pen INJECT 70 UNITS INTO THE SKIN AT BEDTIME   [DISCONTINUED] tirzepatide  (MOUNJARO ) 5 MG/0.5ML Pen Inject 5 mg into the skin once a week.   fluticasone  (FLONASE ) 50 MCG/ACT nasal spray Place 1 spray into both nostrils daily. (Patient not taking: Reported on 09/06/2023)   fluticasone  (FLOVENT  HFA) 110 MCG/ACT inhaler INHALE TWO PUFFS INTO THE LUNGS TWICE DAILY (Patient not taking: Reported on 09/19/2023)   insulin  glargine (LANTUS  SOLOSTAR) 100 UNIT/ML Solostar Pen Inject 50 Units into the skin at bedtime.   tirzepatide  (MOUNJARO ) 5 MG/0.5ML Pen Inject 5 mg into the skin once a week.   [DISCONTINUED] docusate sodium  (COLACE) 100 MG capsule Take 1 capsule (100 mg total) by mouth 2 (two) times daily. (Patient not taking: Reported on 03/11/2024)   [DISCONTINUED] HYDROcodone -acetaminophen  (NORCO/VICODIN) 5-325 MG tablet Take 1-2 tablets by mouth every 6 (six) hours as needed for moderate pain ((score 4 to 6)). (Patient not taking: Reported on 03/11/2024)   [DISCONTINUED] meloxicam  (MOBIC ) 15 MG tablet Take 15 mg by mouth daily. (Patient not taking: Reported on 09/06/2023)   [DISCONTINUED] tirzepatide  (MOUNJARO ) 2.5 MG/0.5ML Pen Inject 2.5 mg into the skin once a week.   No facility-administered encounter medications on file as of 03/11/2024.   ALLERGIES: Allergies  Allergen Reactions   Augmentin [Amoxicillin-Pot Clavulanate]     GI upset Did it involve swelling of the face/tongue/throat,  SOB, or low BP? No Did it involve sudden or severe rash/hives, skin peeling, or any reaction on the inside of your mouth or nose? No Did you need to seek medical attention at a hospital or doctor's office? No When did it last happen?      15 years If all above answers are "NO", may proceed with cephalosporin use.    Cinnamon Hives and Swelling   Coconut Flavoring Agent (Non-Screening) Hives and Swelling  Januvia  [Sitagliptin ]     GI upset   Metformin Diarrhea    GI Upset   Pravastatin      Pain in joints   Strawberry (Diagnostic) Hives and Swelling   VACCINATION STATUS: Immunization History  Administered Date(s) Administered   Influenza, Seasonal, Injecte, Preservative Fre 08/09/2016   Influenza,inj,Quad PF,6+ Mos 12/27/2017   PFIZER(Purple Top)SARS-COV-2 Vaccination 03/11/2020, 04/11/2020   Pneumococcal Polysaccharide-23 12/30/2013    Diabetes She presents for her follow-up diabetic visit. She has type 2 diabetes mellitus. Onset time: She was diagnosed at approximate age of 48 years. Her disease course has been improving. Hypoglycemia symptoms include nervousness/anxiousness, sweats and tremors. Pertinent negatives for hypoglycemia include no confusion, headaches, pallor or seizures. Associated symptoms include weight loss. Pertinent negatives for diabetes include no chest pain, no polydipsia, no polyphagia and no polyuria. Hypoglycemia complications include nocturnal hypoglycemia. Symptoms are stable. There are no diabetic complications. Risk factors for coronary artery disease include diabetes mellitus, dyslipidemia, family history, hypertension, obesity, sedentary lifestyle and tobacco exposure. Current diabetic treatment includes insulin  injections (and Mounjaro ). She is compliant with treatment most of the time. Her weight is decreasing steadily. She is following a generally healthy diet. When asked about meal planning, she reported none. She has not had a previous visit with a  dietitian. She never participates in exercise. Her home blood glucose trend is decreasing steadily. Her breakfast blood glucose range is generally 110-130 mg/dl. (She presents today with her logs showing improving glycemic profile.  Her most recent A1c on 4/17 was 7%, improving drastically from last visit of 10.8%.  She does report some hypoglycemia overnight.  She is tolerating the Mounjaro  well, has not been on the 5 mg dose for too long.  She has lost 14 lbs since last visit and overall feels much better.  She will be going on an [place] cruise next month.) An ACE inhibitor/angiotensin II receptor blocker is being taken. She sees a podiatrist.Eye exam is current.  Hyperlipidemia This is a chronic problem. The current episode started more than 1 year ago. The problem is controlled. Recent lipid tests were reviewed and are normal. Exacerbating diseases include chronic renal disease, diabetes and obesity. Factors aggravating her hyperlipidemia include fatty foods. Pertinent negatives include no chest pain, myalgias or shortness of breath. Treatments tried: she does not tolerate statins; is on Repatha . The current treatment provides no improvement of lipids. Compliance problems include medication side effects, adherence to diet and adherence to exercise.  Risk factors for coronary artery disease include diabetes mellitus, dyslipidemia, hypertension, obesity and a sedentary lifestyle.  Hypertension This is a chronic problem. The current episode started more than 1 year ago. The problem has been gradually improving since onset. The problem is controlled. Associated symptoms include peripheral edema and sweats. Pertinent negatives include no chest pain, headaches, palpitations or shortness of breath. There are no associated agents to hypertension. Risk factors for coronary artery disease include dyslipidemia, diabetes mellitus, obesity, sedentary lifestyle and post-menopausal state. Past treatments include ACE  inhibitors and diuretics. The current treatment provides mild improvement. Compliance problems include diet and exercise.  Hypertensive end-organ damage includes kidney disease. Identifiable causes of hypertension include chronic renal disease.    Review of systems  Constitutional: + decreasing body weight,  current Body mass index is 41.79 kg/m. , no fatigue, no subjective hyperthermia, no subjective hypothermia Eyes: no blurry vision, no xerophthalmia ENT: no sore throat, no nodules palpated in throat, no dysphagia/odynophagia, no hoarseness Cardiovascular: no chest pain, no shortness of breath,  no palpitations, no leg swelling Respiratory: no cough, no shortness of breath Gastrointestinal: no nausea/vomiting/diarrhea Musculoskeletal: no muscle/joint aches Skin: no rashes, no hyperemia Neurological: no tremors, no numbness, no tingling, no dizziness Psychiatric: no depression, no anxiety    Objective:    BP 130/72 (BP Location: Right Arm, Patient Position: Sitting, Cuff Size: Large)   Pulse 75   Ht 5' (1.524 m)   Wt 214 lb (97.1 kg)   BMI 41.79 kg/m   Wt Readings from Last 3 Encounters:  03/11/24 214 lb (97.1 kg)  09/19/23 228 lb 9.6 oz (103.7 kg)  07/20/23 222 lb 0.1 oz (100.7 kg)    BP Readings from Last 3 Encounters:  03/11/24 130/72  09/19/23 (!) 140/80  07/20/23 (!) 150/66      Physical Exam- Limited  Constitutional:  Body mass index is 41.79 kg/m. , not in acute distress, normal state of mind Eyes:  EOMI, no exophthalmos Musculoskeletal: no gross deformities, strength intact in all four extremities, no gross restriction of joint movements Skin:  no rashes, no hyperemia Neurological: no tremor with outstretched hands  Diabetic Foot Exam - Simple   No data filed     Results for orders placed or performed in visit on 03/11/24  Hemoglobin A1c   Collection Time: 03/06/24 12:00 AM  Result Value Ref Range   Hemoglobin A1C 7.0    Diabetic Labs (most  recent): Lab Results  Component Value Date   HGBA1C 7.0 03/06/2024   HGBA1C 10.8 09/10/2023   HGBA1C 7.3 (H) 05/15/2023   MICROALBUR 10.0 03/30/2022   MICROALBUR 10 07/11/2021   MICROALBUR 10 09/06/2020   Lipid Panel     Component Value Date/Time   CHOL 122 09/10/2023 0000   TRIG 147 09/10/2023 0000   HDL 56 09/10/2023 0000   CHOLHDL 1.9 07/07/2021 0720   VLDL 43 (H) 11/29/2016 0729   LDLCALC 41 09/10/2023 0000   LDLCALC 34 07/07/2021 0720     Assessment & Plan:   1) Type 2 Diabetes Mellitus without complication with long-term current use of insulin   She presents today with her logs showing improving glycemic profile.  Her most recent A1c on 4/17 was 7%, improving drastically from last visit of 10.8%.  She does report some hypoglycemia overnight.  She is tolerating the Mounjaro  well, has not been on the 5 mg dose for too long.  She has lost 14 lbs since last visit and overall feels much better.  She will be going on an [place] cruise next month.  She needed A1c to be at goal to qualify for surgery to remove glass shard from arm.  - She remains at a high risk for more acute and chronic complications of diabetes which include CAD, CVA, CKD, retinopathy, and neuropathy. These are all discussed in detail with the patient.    Recent labs reviewed.  - Nutritional counseling repeated at each appointment due to patients tendency to fall back in to old habits.  - The patient admits there is a room for improvement in their diet and drink choices. -  Suggestion is made for the patient to avoid simple carbohydrates from their diet including Cakes, Sweet Desserts / Pastries, Ice Cream, Soda (diet and regular), Sweet Tea, Candies, Chips, Cookies, Sweet Pastries, Store Bought Juices, Alcohol in Excess of 1-2 drinks a day, Artificial Sweeteners, Coffee Creamer, and "Sugar-free" Products. This will help patient to have stable blood glucose profile and potentially avoid unintended weight gain.    - I encouraged the patient  to switch to unprocessed or minimally processed complex starch and increased protein intake (animal or plant source), fruits, and vegetables.   - Patient is advised to stick to a routine mealtimes to eat 3 meals a day and avoid unnecessary snacks (to snack only to correct hypoglycemia).  - I have approached patient with the following individualized plan to manage diabetes and patient agrees.  -She is advised to lower Lantus  to 50 units SQ nightly and continue Mounjaro  5 mg SQ weekly (may increase this on subsequent visits.   -From an endocrinology standpoint, she is stable enough for surgery.  -She is encouraged to continue monitoring blood glucose at least twice daily, before breakfast and before bed, and call the clinic if she has readings less than 70 or greater than 200 for 3 tests in a row.  - Patient specific target  for A1c; LDL, HDL, Triglycerides, and  Waist Circumference were discussed in detail.  2) BP/HTN: Her blood pressure is controlled to target today.  She is advised to continue her current meds as prescribed by PCP.    3) Lipids/HPL:  Her recent lipid panel from 03/06/24 shows controlled LDL of 28.  She does not tolerate statins.  She is advised to continue Repatha  140 mg SQ every 2 weeks.    4)  Weight/Diet:  Her Body mass index is 41.79 kg/m.--  She is a candidate for modest weight loss.  Exercise, and carbohydrates information provided.  5) Chronic Care/Health Maintenance: -Patient is on ACEI/ARB medications and encouraged to continue to follow up with Ophthalmology, Podiatrist at least yearly or according to recommendations, and advised to  stay away from smoking. I have recommended yearly flu vaccine and pneumonia vaccination at least every 5 years; moderate intensity exercise for up to 150 minutes weekly; and  sleep for at least 7 hours a day.  - I advised patient to maintain close follow up with Omie Bickers, MD for primary care  needs.     I spent  30  minutes in the care of the patient today including review of labs from CMP, Lipids, Thyroid  Function, Hematology (current and previous including abstractions from other facilities); face-to-face time discussing  her blood glucose readings/logs, discussing hypoglycemia and hyperglycemia episodes and symptoms, medications doses, her options of short and long term treatment based on the latest standards of care / guidelines;  discussion about incorporating lifestyle medicine;  and documenting the encounter. Risk reduction counseling performed per USPSTF guidelines to reduce obesity and cardiovascular risk factors.     Please refer to Patient Instructions for Blood Glucose Monitoring and Insulin /Medications Dosing Guide"  in media tab for additional information. Please  also refer to " Patient Self Inventory" in the Media  tab for reviewed elements of pertinent patient history.  Claudio Culver Whack participated in the discussions, expressed understanding, and voiced agreement with the above plans.  All questions were answered to her satisfaction. she is encouraged to contact clinic should she have any questions or concerns prior to her return visit.    Follow up plan: -Return in about 4 months (around 07/11/2024) for Diabetes F/U with A1c in office, No previsit labs, Bring meter and logs.   Hulon Magic, [facility] [address] [place], [place] [address] Phone: [phone] Fax: [phone]  03/11/2024, 11:56 AM

## 2024-03-14 ENCOUNTER — Telehealth: Payer: Self-pay

## 2024-03-14 DIAGNOSIS — S50351A Superficial foreign body of right elbow, initial encounter: Secondary | ICD-10-CM | POA: Diagnosis not present

## 2024-03-14 DIAGNOSIS — Z6841 Body Mass Index (BMI) 40.0 and over, adult: Secondary | ICD-10-CM | POA: Diagnosis not present

## 2024-03-14 DIAGNOSIS — M5416 Radiculopathy, lumbar region: Secondary | ICD-10-CM | POA: Diagnosis not present

## 2024-03-14 DIAGNOSIS — M47816 Spondylosis without myelopathy or radiculopathy, lumbar region: Secondary | ICD-10-CM | POA: Diagnosis not present

## 2024-03-14 NOTE — Telephone Encounter (Signed)
   Name: Lisa Burns  DOB: [birth year]  MRN: [id number]   Primary Cardiologist: Dorothye Gathers, MD  Chart reviewed as part of pre-operative protocol coverage. Patient was contacted 03/14/2024 in reference to pre-operative risk assessment for pending surgery as outlined below.  Lisa Burns was last seen on 09/19/2023 by Charles Connor NP.  Since that day, Lisa Burns has done well without any exertional chest pain or worsening dyspnea.  Therefore, based on ACC/AHA guidelines, the patient would be at acceptable risk for the planned procedure without further cardiovascular testing.   She may hold aspirin  for 7 days prior to the procedure and restart as soon as possible afterward at the surgeon's discretion.  The patient was advised that if she develops new symptoms prior to surgery to contact our office to arrange for a follow-up visit, and she verbalized understanding.  I will route this recommendation to the requesting party via Epic fax function and remove from pre-op pool. Please call with questions.  Deneisha Dade, [place] 03/14/2024, 11:17 AM

## 2024-03-14 NOTE — Telephone Encounter (Signed)
   Pre-operative Risk Assessment    Patient Name: Lisa Burns  DOB: [birth year] MRN: [id number]   Date of last office visit: 05/08/2023 Date of next office visit: Not scheduled   Request for Surgical Clearance    Procedure:   Rt elbow ulnar transposition, foreign nody excision  Date of Surgery:  Clearance TBD                                Surgeon:  Randal Bury Surgeon's Group or Practice Name:  [facility] Phone number:  [phone] Fax number:  [phone]   Type of Clearance Requested:   - Medical    Type of Anesthesia:   Choice   Additional requests/questions:    [name]   03/14/2024, 10:47 AM

## 2024-03-24 ENCOUNTER — Other Ambulatory Visit (HOSPITAL_BASED_OUTPATIENT_CLINIC_OR_DEPARTMENT_OTHER): Payer: Self-pay

## 2024-03-27 ENCOUNTER — Ambulatory Visit: Admitting: Physical Therapy

## 2024-03-27 ENCOUNTER — Other Ambulatory Visit: Payer: Self-pay

## 2024-03-27 DIAGNOSIS — M6281 Muscle weakness (generalized): Secondary | ICD-10-CM

## 2024-03-27 DIAGNOSIS — R2689 Other abnormalities of gait and mobility: Secondary | ICD-10-CM | POA: Diagnosis not present

## 2024-03-27 DIAGNOSIS — R279 Unspecified lack of coordination: Secondary | ICD-10-CM

## 2024-03-27 DIAGNOSIS — R29898 Other symptoms and signs involving the musculoskeletal system: Secondary | ICD-10-CM

## 2024-03-27 DIAGNOSIS — R200 Anesthesia of skin: Secondary | ICD-10-CM | POA: Diagnosis not present
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
## 2024-04-01 DIAGNOSIS — Z794 Long term (current) use of insulin: Secondary | ICD-10-CM | POA: Diagnosis not present

## 2024-04-01 DIAGNOSIS — E119 Type 2 diabetes mellitus without complications: Secondary | ICD-10-CM | POA: Diagnosis not present

## 2024-04-01 DIAGNOSIS — H04123 Dry eye syndrome of bilateral lacrimal glands: Secondary | ICD-10-CM | POA: Diagnosis not present

## 2024-04-01 DIAGNOSIS — H2513 Age-related nuclear cataract, bilateral: Secondary | ICD-10-CM | POA: Diagnosis not present

## 2024-04-02 DIAGNOSIS — Z0001 Encounter for general adult medical examination with abnormal findings: Secondary | ICD-10-CM | POA: Diagnosis not present

## 2024-04-02 DIAGNOSIS — E785 Hyperlipidemia, unspecified: Secondary | ICD-10-CM | POA: Diagnosis not present

## 2024-04-02 DIAGNOSIS — E119 Type 2 diabetes mellitus without complications: Secondary | ICD-10-CM | POA: Diagnosis not present

## 2024-04-02 DIAGNOSIS — H04121 Dry eye syndrome of right lacrimal gland: Secondary | ICD-10-CM | POA: Diagnosis not present

## 2024-04-02 DIAGNOSIS — J4540 Moderate persistent asthma, uncomplicated: Secondary | ICD-10-CM | POA: Diagnosis not present

## 2024-04-02 DIAGNOSIS — J302 Other seasonal allergic rhinitis: Secondary | ICD-10-CM | POA: Diagnosis not present

## 2024-04-22 ENCOUNTER — Encounter: Admitting: Physical Therapy
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
## 2024-04-25 ENCOUNTER — Other Ambulatory Visit: Payer: Self-pay

## 2024-04-25 ENCOUNTER — Encounter (HOSPITAL_BASED_OUTPATIENT_CLINIC_OR_DEPARTMENT_OTHER): Payer: Self-pay | Admitting: Orthopedic Surgery

## 2024-04-25 NOTE — H&P (Signed)
 PREOPERATIVE H&P  Chief Complaint: ULNAR NERVE LESION AT ELBOW, OLECRANON BURSITIS, FOREIGN BODY EXC., RIGHT  HPI: Lisa Burns is a 63 y.o. female who presents with a diagnosis of ULNAR NERVE LESION AT ELBOW, OLECRANON BURSITIS, FOREIGN BODY EXC., RIGHT. Symptoms are rated as moderate to severe, and have been worsening.  This is significantly impairing activities of daily living.  She has elected for surgical management.   Past Medical History:  Diagnosis Date   Acute pharyngitis    Allergy    Anemia    Arthritis    Asthma    Constipation    Coronary artery disease    Cough    Diabetes mellitus with neuropathy (HCC)    Diabetes mellitus without complication (HCC)    GERD (gastroesophageal reflux disease)    Heart murmur    Heart murmur    History of hiatal hernia    Sx in 2000   Hyperlipidemia    Hypertension    Microproteinuria    Neuropathy    Obesity    OSA (obstructive sleep apnea)    Proteinuria    Reflux    Snoring    Ulcer of mouth    URI (upper respiratory infection)    Venous stasis    Past Surgical History:  Procedure Laterality Date   CESAREAN SECTION  1986   COLONOSCOPY N/A 04/24/2016   Procedure: COLONOSCOPY;  Surgeon: Alyce Jubilee, MD;  Location: [facility];  Service: Endoscopy;  Laterality: N/A;  8:30 Am   COLONOSCOPY N/A 07/18/2019   Dr. Nolene Baumgarten: 6 simple adenomas removed.  External/internal hemorrhoids.  Next colonoscopy in 3 years.   LAPAROSCOPIC NISSEN FUNDOPLICATION  2000   LUMBAR LAMINECTOMY/ DECOMPRESSION WITH MET-RX Left 05/16/2023   Procedure: LUMBAR THREE-LUMBAR FOUR LAMINOTOMY, MEDICAL FACETECTOMY AND FORAMINOTOMY;  Surgeon: Van Gelinas, MD;  Location: [facility] OR;  Service: Neurosurgery;  Laterality: Left;  3C   OTHER SURGICAL HISTORY     gerd surgery   POLYPECTOMY  07/18/2019   Procedure: POLYPECTOMY;  Surgeon: Alyce Jubilee, MD;  Location: [facility];  Service: Endoscopy;;   THROAT SURGERY  2000   TONSILLECTOMY     As a  child   Social History   Socioeconomic History   Marital status: Married    Spouse name: Not on file   Number of children: 1   Years of education: HS   Highest education level: Not on file  Occupational History   Occupation: [employer]   Tobacco Use   Smoking status: Former    Current packs/day: 0.00    Average packs/day: 1 pack/day for 5.0 years (5.0 ttl pk-yrs)    Types: Cigarettes    Start date: 1999    Quit date: 2004    Years since quitting: 21.4   Smokeless tobacco: Never  Vaping Use   Vaping status: Never Used  Substance and Sexual Activity   Alcohol use: Yes    Alcohol/week: 0.0 standard drinks of alcohol    Comment: occasionally   Drug use: No   Sexual activity: Yes    Birth control/protection: None  Other Topics Concern   Not on file  Social History Narrative   Drinks 2 cans of soda a day    Social Drivers of Corporate investment banker Strain: Not on file  Food Insecurity: Not on file  Transportation Needs: Not on file  Physical Activity: Not on file  Stress: Not on file  Social Connections: Not on file  Family History  Problem Relation Age of Onset   Diabetes Mother    Heart disease Father    Cancer Father        lung   Colon cancer Father    Heart disease Sister    Diabetes Sister    Heart disease Brother    Diabetes Brother    Colon cancer Paternal Grandmother    Allergies  Allergen Reactions   Augmentin [Amoxicillin-Pot Clavulanate]     GI upset Did it involve swelling of the face/tongue/throat, SOB, or low BP? No Did it involve sudden or severe rash/hives, skin peeling, or any reaction on the inside of your mouth or nose? No Did you need to seek medical attention at a hospital or doctor's office? No When did it last happen?      15 years If all above answers are "NO", may proceed with cephalosporin use.    Cinnamon Hives and Swelling   Coconut Flavoring Agent (Non-Screening) Hives and Swelling   Januvia  [Sitagliptin ]     GI  upset   Metformin Diarrhea    GI Upset   Pravastatin      Pain in joints   Strawberry (Diagnostic) Hives and Swelling   Prior to Admission medications   Medication Sig Start Date End Date Taking? Authorizing Provider  albuterol  (PROAIR  HFA) 108 (90 Base) MCG/ACT inhaler INHALE TWO PUFFS INTO THE LUNGS EVERY SIX HOURS AS NEEDED 10/29/20  Yes Carolynne Citron, Malena M, DO  aspirin  EC 81 MG tablet Take 1 tablet (81 mg total) by mouth daily. Swallow whole. 06/13/22  Yes Hugh Madura, MD  Azelaic Acid  15 % cream Apply 1 application topically daily. Apply to face 02/14/19  Yes [provider]  clobetasol  (TEMOVATE ) 0.05 % external solution Apply 1 application  topically 2 (two) times daily as needed (psoriasis). 02/12/19  Yes [provider]  enalapril  (VASOTEC ) 20 MG tablet TAKE ONE TABLET (20MG  TOTAL) BY MOUTH ATBEDTIME 05/13/21  Yes Carolynne Citron, Malena M, DO  furosemide  (LASIX ) 40 MG tablet TAKE ONE TABLET (40MG  TOTAL) BY MOUTH EVERY MORNING 07/04/21  Yes Carolynne Citron, Malena M, DO  gabapentin  (NEURONTIN ) 400 MG capsule TAKE ONE (1) CAPSULE THREE (3) TIMES EACH DAY Patient taking differently: Take 400 mg by mouth 3 (three) times daily as needed (pain). 10/29/20  Yes Carolynne Citron, Malena M, DO  glucose blood test strip Use as instructed 02/26/13  Yes Areta Kuba, MD  insulin  glargine (LANTUS  SOLOSTAR) 100 UNIT/ML Solostar Pen Inject 50 Units into the skin at bedtime. 03/11/24  Yes Wendel Hals, NP  isosorbide  mononitrate (IMDUR ) 30 MG 24 hr tablet TAKE ONE TABLET (30MG  TOTAL) BY MOUTH DAILY 09/25/23  Yes Gerald Kitty., NP  lansoprazole  (PREVACID ) 15 MG capsule Take 1 capsule (15 mg total) by mouth daily. 10/29/20  Yes Carolynne Citron, Malena M, DO  loratadine (CLARITIN) 10 MG tablet Take 10 mg by mouth daily.    Yes [provider]  metoprolol  succinate (TOPROL  XL) 25 MG 24 hr tablet Take 1 tablet (25 mg total) by mouth daily. 05/08/23  Yes Gerald Kitty., NP  NON FORMULARY Pt uses a cpap  nightly   Yes [provider]  nortriptyline  (PAMELOR ) 50 MG capsule TAKE TWO CAPSULES BY MOUTH AT BEDTIME 05/13/21  Yes Carolynne Citron, Malena M, DO  cyclobenzaprine  (FLEXERIL ) 10 MG tablet Take 10 mg by mouth 3 (three) times daily as needed for muscle spasms. 03/14/23   [provider]  fluticasone  (FLONASE ) 50 MCG/ACT nasal spray Place  1 spray into both nostrils daily. Patient not taking: Reported on 09/06/2023 12/27/17   Areta Kuba, MD  fluticasone  (FLOVENT  HFA) 110 MCG/ACT inhaler INHALE TWO PUFFS INTO THE LUNGS TWICE DAILY Patient not taking: Reported on 09/19/2023 10/29/20   Glena Landau M, DO  REPATHA  SURECLICK 140 MG/ML SOAJ INJECT 140MG  INTO THE SKIN EVERY 14 DAYS 10/08/23   Wendel Hals, NP  tirzepatide  (MOUNJARO ) 5 MG/0.5ML Pen Inject 5 mg into the skin once a week. 03/11/24   Wendel Hals, NP  valACYclovir  (VALTREX ) 500 MG tablet Take 500 mg by mouth 2 (two) times daily as needed (cold sore flare ups).  03/05/13   [provider]     Positive ROS: All other systems have been reviewed and were otherwise negative with the exception of those mentioned in the HPI and as above.  Physical Exam: General: Alert, no acute distress Cardiovascular: No pedal edema Respiratory: No cyanosis, no use of accessory musculature GI: No organomegaly, abdomen is soft and non-tender Skin: No lesions in the area of chief complaint Neurologic: Sensation intact distally Psychiatric: Patient is competent for consent with normal mood and affect Lymphatic: No axillary or cervical lymphadenopathy  MUSCULOSKELETAL: TTP right elbow near cubital tunnel and olecranon bursa, full ROM elbow, no edema or erythema, NVI   Imaging: xrays show what looks like a piece of glass   Assessment: ULNAR NERVE LESION AT ELBOW, OLECRANON BURSITIS, FOREIGN BODY EXC., RIGHT  Plan: Plan for Procedure(s): ULNAR NERVE DECOMPRESSION/TRANSPOSITION BURSECTOMY, ELBOW  The risks benefits and  alternatives were discussed with the patient including but not limited to the risks of nonoperative treatment, versus surgical intervention including infection, bleeding, nerve injury,  blood clots, cardiopulmonary complications, morbidity, mortality, among others, and they were willing to proceed.   Weightbearing: WBAT RUE Orthopedic devices: sling Showering: POD 3 Dressing: reinforce PRN Medicines: ASA, Oxy, Tylenol , Mobic , Zofran   Discharge: home Follow up: 05/09/24 at 10:15am    Lore Rode, PA-C Office [phone] 04/25/2024 1:43 PM

## 2024-04-25 NOTE — Progress Notes (Signed)
   04/25/24 1228  PAT Phone Screen  Is the patient taking a GLP-1 receptor agonist? Yes  Has the patient been informed on holding medication? Yes  Do You Have Diabetes? Yes  Do You Have Hypertension? Yes  Have You Ever Been to the ER for Asthma? No  Have You Taken Oral Steroids in the Past 3 Months? No  Do you Take Phenteramine or any Other Diet Drugs? No  Recent  Lab Work, EKG, CXR? No  Do you have a history of heart problems? Yes  Cardiologist Name Dr. Renna Cary clearance on chart  Have you ever had tests on your heart? Yes  What cardiac tests were performed? EKG  What date/year were cardiac tests completed? in chart  Height 5\' 1"  (1.549 m)  Weight 93 kg  Pat Appointment Scheduled (S)  Yes (labs, ekg)

## 2024-04-28 ENCOUNTER — Encounter (HOSPITAL_BASED_OUTPATIENT_CLINIC_OR_DEPARTMENT_OTHER)
Admission: RE | Admit: 2024-04-28 | Discharge: 2024-04-28 | Disposition: A | Discharge status: Home / Self Care | Source: Ambulatory Visit | Attending: Orthopedic Surgery | Admitting: Orthopedic Surgery

## 2024-04-28 ENCOUNTER — Encounter: Admitting: Rehabilitative and Restorative Service Providers"

## 2024-04-28 DIAGNOSIS — G5621 Lesion of ulnar nerve, right upper limb: Secondary | ICD-10-CM | POA: Diagnosis present

## 2024-04-28 DIAGNOSIS — I10 Essential (primary) hypertension: Secondary | ICD-10-CM | POA: Diagnosis not present

## 2024-04-28 DIAGNOSIS — I2510 Atherosclerotic heart disease of native coronary artery without angina pectoris: Secondary | ICD-10-CM | POA: Diagnosis not present

## 2024-04-28 DIAGNOSIS — M7021 Olecranon bursitis, right elbow: Secondary | ICD-10-CM | POA: Diagnosis not present

## 2024-04-28 DIAGNOSIS — G4733 Obstructive sleep apnea (adult) (pediatric): Secondary | ICD-10-CM | POA: Diagnosis not present

## 2024-04-28 DIAGNOSIS — Z794 Long term (current) use of insulin: Secondary | ICD-10-CM | POA: Diagnosis not present

## 2024-04-28 DIAGNOSIS — J45909 Unspecified asthma, uncomplicated: Secondary | ICD-10-CM | POA: Diagnosis not present

## 2024-04-28 DIAGNOSIS — K219 Gastro-esophageal reflux disease without esophagitis: Secondary | ICD-10-CM | POA: Diagnosis not present

## 2024-04-28 DIAGNOSIS — S50351A Superficial foreign body of right elbow, initial encounter: Secondary | ICD-10-CM | POA: Diagnosis not present

## 2024-04-28 DIAGNOSIS — Z7951 Long term (current) use of inhaled steroids: Secondary | ICD-10-CM | POA: Diagnosis not present

## 2024-04-28 DIAGNOSIS — E1140 Type 2 diabetes mellitus with diabetic neuropathy, unspecified: Secondary | ICD-10-CM | POA: Diagnosis not present

## 2024-04-28 DIAGNOSIS — Z7985 Long-term (current) use of injectable non-insulin antidiabetic drugs: Secondary | ICD-10-CM | POA: Diagnosis not present

## 2024-04-28 DIAGNOSIS — K449 Diaphragmatic hernia without obstruction or gangrene: Secondary | ICD-10-CM | POA: Diagnosis not present

## 2024-04-28 DIAGNOSIS — X58XXXA Exposure to other specified factors, initial encounter: Secondary | ICD-10-CM | POA: Diagnosis not present

## 2024-04-28 LAB — BASIC METABOLIC PANEL WITH GFR
Anion gap: 8 (ref 5–15)
BUN: 21 mg/dL (ref 8–23)
CO2: 27 mmol/L (ref 22–32)
Calcium: 10 mg/dL (ref 8.9–10.3)
Chloride: 101 mmol/L (ref 98–111)
Creatinine, Ser: 0.91 mg/dL (ref 0.44–1.00)
GFR, Estimated: >60 mL/min (ref >60)
Glucose, Bld: 172 mg/dL — ABNORMAL HIGH (ref 70–99)
Potassium: 4.9 mmol/L (ref 3.5–5.1)
Sodium: 136 mmol/L (ref 135–145)
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
## 2024-04-28 NOTE — Progress Notes (Signed)

## 2024-04-29 ENCOUNTER — Ambulatory Visit (HOSPITAL_BASED_OUTPATIENT_CLINIC_OR_DEPARTMENT_OTHER)
Admission: RE | Admit: 2024-04-29 | Discharge: 2024-04-29 | Disposition: A | Discharge status: Home / Self Care | Attending: Orthopedic Surgery | Admitting: Orthopedic Surgery

## 2024-04-29 ENCOUNTER — Other Ambulatory Visit: Payer: Self-pay

## 2024-04-29 ENCOUNTER — Encounter (HOSPITAL_BASED_OUTPATIENT_CLINIC_OR_DEPARTMENT_OTHER): Payer: Self-pay | Admitting: Orthopedic Surgery

## 2024-04-29 ENCOUNTER — Ambulatory Visit (HOSPITAL_BASED_OUTPATIENT_CLINIC_OR_DEPARTMENT_OTHER): Admitting: Anesthesiology

## 2024-04-29 ENCOUNTER — Encounter (HOSPITAL_BASED_OUTPATIENT_CLINIC_OR_DEPARTMENT_OTHER)
Admission: RE | Disposition: A | Discharge status: Home / Self Care | Payer: Self-pay | Source: Home / Self Care | Attending: Orthopedic Surgery

## 2024-04-29 DIAGNOSIS — K449 Diaphragmatic hernia without obstruction or gangrene: Secondary | ICD-10-CM | POA: Diagnosis not present

## 2024-04-29 DIAGNOSIS — I10 Essential (primary) hypertension: Secondary | ICD-10-CM | POA: Diagnosis not present

## 2024-04-29 DIAGNOSIS — X58XXXA Exposure to other specified factors, initial encounter: Secondary | ICD-10-CM | POA: Insufficient documentation

## 2024-04-29 DIAGNOSIS — G4733 Obstructive sleep apnea (adult) (pediatric): Secondary | ICD-10-CM | POA: Diagnosis not present

## 2024-04-29 DIAGNOSIS — S40851A Superficial foreign body of right upper arm, initial encounter: Secondary | ICD-10-CM | POA: Diagnosis not present

## 2024-04-29 DIAGNOSIS — I2510 Atherosclerotic heart disease of native coronary artery without angina pectoris: Secondary | ICD-10-CM | POA: Diagnosis not present

## 2024-04-29 DIAGNOSIS — Z7985 Long-term (current) use of injectable non-insulin antidiabetic drugs: Secondary | ICD-10-CM | POA: Diagnosis not present

## 2024-04-29 DIAGNOSIS — Z794 Long term (current) use of insulin: Secondary | ICD-10-CM | POA: Diagnosis not present

## 2024-04-29 DIAGNOSIS — E1140 Type 2 diabetes mellitus with diabetic neuropathy, unspecified: Secondary | ICD-10-CM | POA: Diagnosis not present

## 2024-04-29 DIAGNOSIS — Z7951 Long term (current) use of inhaled steroids: Secondary | ICD-10-CM | POA: Insufficient documentation

## 2024-04-29 DIAGNOSIS — S50351A Superficial foreign body of right elbow, initial encounter: Secondary | ICD-10-CM | POA: Diagnosis not present

## 2024-04-29 DIAGNOSIS — M7021 Olecranon bursitis, right elbow: Secondary | ICD-10-CM | POA: Insufficient documentation

## 2024-04-29 DIAGNOSIS — G5621 Lesion of ulnar nerve, right upper limb: Secondary | ICD-10-CM | POA: Diagnosis not present

## 2024-04-29 DIAGNOSIS — J45909 Unspecified asthma, uncomplicated: Secondary | ICD-10-CM | POA: Diagnosis not present

## 2024-04-29 DIAGNOSIS — K219 Gastro-esophageal reflux disease without esophagitis: Secondary | ICD-10-CM | POA: Insufficient documentation

## 2024-04-29 HISTORY — DX: Atherosclerotic heart disease of native coronary artery without angina pectoris: I25.10

## 2024-04-29 HISTORY — PX: OLECRANON BURSECTOMY: SHX2097

## 2024-04-29 HISTORY — PX: ULNAR NERVE TRANSPOSITION: SHX2595

## 2024-04-29 HISTORY — PX: FOREIGN BODY REMOVAL: SHX962

## 2024-04-29 LAB — GLUCOSE, CAPILLARY
Glucose-Capillary: 96 mg/dL (ref 70–99)
Glucose-Capillary: 89 mg/dL (ref 70–99)

## 2024-04-29 SURGERY — ULNAR NERVE DECOMPRESSION/TRANSPOSITION
Anesthesia: Monitor Anesthesia Care | Site: Elbow | Laterality: Right

## 2024-04-29 MED ORDER — ACETAMINOPHEN 500 MG PO TABS
1000.0000 mg | ORAL_TABLET | Freq: Once | ORAL | Status: AC
Start: 2024-04-29 — End: 2024-04-29
  Administered 2024-04-29: 1000 mg via ORAL

## 2024-04-29 MED ORDER — FENTANYL CITRATE (PF) 100 MCG/2ML IJ SOLN
INTRAMUSCULAR | Status: AC
Start: 2024-04-29 — End: ?
  Filled 2024-04-29: qty 2

## 2024-04-29 MED ORDER — CEFAZOLIN SODIUM-DEXTROSE 2-4 GM/100ML-% IV SOLN
2.0000 g | INTRAVENOUS | Status: AC
  Administered 2024-04-29: 2 g via INTRAVENOUS

## 2024-04-29 MED ORDER — BUPIVACAINE HCL (PF) 0.5 % IJ SOLN
INTRAMUSCULAR | Status: DC | PRN
  Administered 2024-04-29: 30 mL via PERINEURAL

## 2024-04-29 MED ORDER — CEFAZOLIN SODIUM-DEXTROSE 2-4 GM/100ML-% IV SOLN
INTRAVENOUS | Status: AC
  Filled 2024-04-29: qty 100

## 2024-04-29 MED ORDER — PROPOFOL 500 MG/50ML IV EMUL
INTRAVENOUS | Status: DC | PRN
  Administered 2024-04-29: 50 ug/kg/min via INTRAVENOUS

## 2024-04-29 MED ORDER — MIDAZOLAM HCL 2 MG/2ML IJ SOLN
INTRAMUSCULAR | Status: AC
Start: 2024-04-29 — End: ?
  Filled 2024-04-29: qty 2

## 2024-04-29 MED ORDER — DEXMEDETOMIDINE HCL IN NACL 80 MCG/20ML IV SOLN
INTRAVENOUS | Status: DC | PRN
  Administered 2024-04-29: 4 ug via INTRAVENOUS

## 2024-04-29 MED ORDER — OXYCODONE HCL 5 MG/5ML PO SOLN: 5.0000 mg | Freq: Once | ORAL | Status: DC | PRN

## 2024-04-29 MED ORDER — POVIDONE-IODINE 10 % EX SWAB
2.0000 | Freq: Once | CUTANEOUS | Status: AC
Start: 2024-04-29 — End: 2024-04-29
  Administered 2024-04-29: 2 via TOPICAL

## 2024-04-29 MED ORDER — ONDANSETRON HCL 4 MG/2ML IJ SOLN: 4.0000 mg | Freq: Four times a day (QID) | INTRAMUSCULAR | Status: DC | PRN

## 2024-04-29 MED ORDER — FENTANYL CITRATE (PF) 100 MCG/2ML IJ SOLN: 25.0000 ug | INTRAMUSCULAR | Status: DC | PRN

## 2024-04-29 MED ORDER — ONDANSETRON 4 MG PO TBDP: 4.0000 mg | ORAL_TABLET | Freq: Three times a day (TID) | ORAL | 0 refills | Status: DC | PRN

## 2024-04-29 MED ORDER — HYDROCODONE-ACETAMINOPHEN 10-325 MG PO TABS: 1.0000 | ORAL_TABLET | Freq: Four times a day (QID) | ORAL | 0 refills | Status: DC | PRN

## 2024-04-29 MED ORDER — MIDAZOLAM HCL 2 MG/2ML IJ SOLN
2.0000 mg | Freq: Once | INTRAMUSCULAR | Status: AC
  Administered 2024-04-29: 1 mg via INTRAVENOUS

## 2024-04-29 MED ORDER — MELOXICAM 15 MG PO TABS: 15.0000 mg | ORAL_TABLET | Freq: Every day | ORAL | 0 refills | Status: AC | PRN

## 2024-04-29 MED ORDER — LACTATED RINGERS IV SOLN: INTRAVENOUS | Status: DC

## 2024-04-29 MED ORDER — ACETAMINOPHEN 500 MG PO TABS
ORAL_TABLET | ORAL | Status: AC
  Filled 2024-04-29: qty 2

## 2024-04-29 MED ORDER — ONDANSETRON HCL 4 MG/2ML IJ SOLN
INTRAMUSCULAR | Status: DC | PRN
  Administered 2024-04-29: 4 mg via INTRAVENOUS

## 2024-04-29 MED ORDER — OXYCODONE HCL 5 MG PO TABS: 5.0000 mg | ORAL_TABLET | Freq: Once | ORAL | Status: DC | PRN

## 2024-04-29 MED ORDER — FENTANYL CITRATE (PF) 100 MCG/2ML IJ SOLN
100.0000 ug | Freq: Once | INTRAMUSCULAR | Status: AC
  Administered 2024-04-29: 50 ug via INTRAVENOUS

## 2024-04-29 SURGICAL SUPPLY — 51 items
BLADE SURG 15 STRL LF DISP TIS (BLADE) ×2 IMPLANT
BNDG COHESIVE 4X5 TAN STRL LF (GAUZE/BANDAGES/DRESSINGS) ×1 IMPLANT
BNDG ELASTIC 4INX 5YD STR LF (GAUZE/BANDAGES/DRESSINGS) ×1 IMPLANT
CHLORAPREP W/TINT 26 (MISCELLANEOUS) ×1 IMPLANT
CLSR STERI-STRIP ANTIMIC 1/2X4 (GAUZE/BANDAGES/DRESSINGS) IMPLANT
COVER BACK TABLE 60X90IN (DRAPES) ×1 IMPLANT
CUFF TOURN SGL QUICK 18 NS (TOURNIQUET CUFF) ×1 IMPLANT
DRAIN PENROSE .5X12 LATEX STL (DRAIN) IMPLANT
DRAPE EXTREMITY T 121X128X90 (DISPOSABLE) ×1 IMPLANT
DRAPE IMP U-DRAPE 54X76 (DRAPES) ×1 IMPLANT
DRAPE INCISE IOBAN 66X45 STRL (DRAPES) IMPLANT
DRAPE U-SHAPE 47X51 STRL (DRAPES) ×1 IMPLANT
DRSG EMULSION OIL 3X3 NADH (GAUZE/BANDAGES/DRESSINGS) ×1 IMPLANT
ELECTRODE REM PT RTRN 9FT ADLT (ELECTROSURGICAL) ×1 IMPLANT
GAUZE PAD ABD 8X10 STRL (GAUZE/BANDAGES/DRESSINGS) ×1 IMPLANT
GAUZE SPONGE 4X4 12PLY STRL (GAUZE/BANDAGES/DRESSINGS) ×1 IMPLANT
GLOVE BIO SURGEON STRL SZ7.5 (GLOVE) ×1 IMPLANT
GLOVE BIOGEL PI IND STRL 7.5 (GLOVE) ×1 IMPLANT
GLOVE BIOGEL PI IND STRL 8 (GLOVE) ×1 IMPLANT
GLOVE SURG SYN 7.5 E (GLOVE) ×1 IMPLANT
GLOVE SURG SYN 7.5 PF PI (GLOVE) ×1 IMPLANT
GOWN STRL REUS W/ TWL LRG LVL3 (GOWN DISPOSABLE) ×2 IMPLANT
GOWN STRL REUS W/ TWL XL LVL3 (GOWN DISPOSABLE) ×1 IMPLANT
MANIFOLD NEPTUNE II (INSTRUMENTS) IMPLANT
NDL HYPO 22X1.5 SAFETY MO (MISCELLANEOUS) IMPLANT
NEEDLE HYPO 22X1.5 SAFETY MO (MISCELLANEOUS) IMPLANT
NS IRRIG 1000ML POUR BTL (IV SOLUTION) ×1 IMPLANT
PACK BASIN DAY SURGERY FS (CUSTOM PROCEDURE TRAY) ×1 IMPLANT
PAD CAST 4YDX4 CTTN HI CHSV (CAST SUPPLIES) ×1 IMPLANT
PENCIL SMOKE EVACUATOR (MISCELLANEOUS) ×1 IMPLANT
SET IRRIG Y TYPE TUR BLADDER L (SET/KITS/TRAYS/PACK) IMPLANT
SLEEVE SCD COMPRESS KNEE MED (STOCKING) IMPLANT
SLING ARM FOAM STRAP LRG (SOFTGOODS) IMPLANT
SPIKE FLUID TRANSFER (MISCELLANEOUS) IMPLANT
SPONGE T-LAP 18X18 ~~LOC~~+RFID (SPONGE) ×1 IMPLANT
SPONGE T-LAP 4X18 ~~LOC~~+RFID (SPONGE) IMPLANT
STOCKINETTE IMPERVIOUS LG (DRAPES) ×1 IMPLANT
SUCTION TUBE FRAZIER 10FR DISP (SUCTIONS) IMPLANT
SUT ETHILON 3 0 PS 1 (SUTURE) IMPLANT
SUT MON AB 2-0 CT1 36 (SUTURE) IMPLANT
SUT MON AB 4-0 PC3 18 (SUTURE) IMPLANT
SUT VIC AB 2-0 SH 27XBRD (SUTURE) IMPLANT
SUT VICRYL 0 SH 27 (SUTURE) IMPLANT
SWAB COLLECTION DEVICE MRSA (MISCELLANEOUS) IMPLANT
SWAB CULTURE ESWAB REG 1ML (MISCELLANEOUS) IMPLANT
SYR BULB EAR ULCER 3OZ GRN STR (SYRINGE) ×1 IMPLANT
SYR CONTROL 10ML LL (SYRINGE) IMPLANT
TOWEL GREEN STERILE FF (TOWEL DISPOSABLE) ×2 IMPLANT
TUBE CONNECTING 20X1/4 (TUBING) ×1 IMPLANT
UNDERPAD 30X36 HEAVY ABSORB (UNDERPADS AND DIAPERS) ×1 IMPLANT
YANKAUER SUCT BULB TIP NO VENT (SUCTIONS) ×1 IMPLANT

## 2024-04-29 NOTE — Anesthesia Preprocedure Evaluation (Addendum)
 Anesthesia Evaluation  Patient identified by MRN, date of birth, ID band Patient awake    Reviewed: Allergy & Precautions, H&P , NPO status , Patient's Chart, lab work & pertinent test results  Airway Mallampati: II   Neck ROM: full    Dental   Pulmonary asthma , sleep apnea , former smoker   breath sounds clear to auscultation       Cardiovascular hypertension, + CAD   Rhythm:regular Rate:Normal     Neuro/Psych    GI/Hepatic hiatal hernia,GERD  ,,  Endo/Other  diabetes, Type 2    Renal/GU      Musculoskeletal  (+) Arthritis ,    Abdominal   Peds  Hematology   Anesthesia Other Findings   Reproductive/Obstetrics                             Anesthesia Physical Anesthesia Plan  ASA: 2  Anesthesia Plan: MAC and Regional   Post-op Pain Management:    Induction: Intravenous  PONV Risk Score and Plan: 2 and Ondansetron , Midazolam , Treatment may vary due to age or medical condition and Propofol  infusion  Airway Management Planned: Simple Face Mask  Additional Equipment:   Intra-op Plan:   Post-operative Plan: Extubation in OR  Informed Consent: I have reviewed the patients History and Physical, chart, labs and discussed the procedure including the risks, benefits and alternatives for the proposed anesthesia with the patient or authorized representative who has indicated his/her understanding and acceptance.     Dental advisory given  Plan Discussed with: CRNA, Anesthesiologist and Surgeon  Anesthesia Plan Comments:        Anesthesia Quick Evaluation

## 2024-04-29 NOTE — Transfer of Care (Signed)
 Immediate Anesthesia Transfer of Care Note  Patient: Lisa Burns  Procedure(s) Performed: ULNAR NERVE DECOMPRESSION (Right: Elbow) BURSECTOMY, ELBOW (Right: Elbow) REMOVAL FOREIGN BODY EXTREMITY (Right: Elbow)  Patient Location: PACU  Anesthesia Type:MAC  Level of Consciousness: awake, alert , oriented, and patient cooperative  Airway & Oxygen Therapy: Patient Spontanous Breathing and Patient connected to face mask oxygen  Post-op Assessment: Report given to RN and Post -op Vital signs reviewed and stable  Post vital signs: Reviewed and stable  Last Vitals:  Vitals Value Taken Time  BP    Temp    Pulse 65 04/29/24 0805  Resp    SpO2 100 % 04/29/24 0805  Vitals shown include unfiled device data.  Last Pain:  Vitals:   04/29/24 0640  TempSrc: Tympanic  PainSc: 0-No pain      Patients Stated Pain Goal: 6 (04/29/24 0640)  Complications: No notable events documented.

## 2024-04-29 NOTE — Interval H&P Note (Signed)
 History and Physical Interval Note:  04/29/2024 6:54 AM  Lisa Burns  has presented today for surgery, with the diagnosis of ULNAR NERVE LESION AT ELBOW, OLECRANON BURSITIS, FOREIGN BODY EXC., RIGHT.  The various methods of treatment have been discussed with the patient and family. After consideration of risks, benefits and other options for treatment, the patient has consented to  Procedure(s): ULNAR NERVE DECOMPRESSION/TRANSPOSITION (Right) BURSECTOMY, ELBOW (Right) as a surgical intervention.  The patient's history has been reviewed, patient examined, no change in status, stable for surgery.  I have reviewed the patient's chart and labs.  Questions were answered to the patient's satisfaction.     [name]

## 2024-04-29 NOTE — Progress Notes (Signed)
Assisted Dr. Hodierne with right, supraclavicular, ultrasound guided block. Side rails up, monitors on throughout procedure. See vital signs in flow sheet. Tolerated Procedure well. 

## 2024-04-29 NOTE — Anesthesia Procedure Notes (Signed)
 Anesthesia Regional Block: Supraclavicular block   Pre-Anesthetic Checklist: , timeout performed,  Correct Patient, Correct Site, Correct Laterality,  Correct Procedure, Correct Position, site marked,  Risks and benefits discussed,  Surgical consent,  Pre-op evaluation,  At surgeon's request and post-op pain management  Laterality: Right  Prep: chloraprep       Needles:  Injection technique: Single-shot  Needle Type: Echogenic Stimulator Needle     Needle Length: 5cm  Needle Gauge: 22     Additional Needles:   Procedures:, nerve stimulator,,,,,     Nerve Stimulator or Paresthesia:  Response: biceps flexion, 0.45 mA  Additional Responses:   Narrative:  Start time: 04/29/2024 7:05 AM End time: 04/29/2024 7:15 AM Injection made incrementally with aspirations every 5 mL.  Performed by: Personally  Anesthesiologist: Ellena Gurney, MD  Additional Notes: Functioning IV was confirmed and monitors were applied.  A 50mm 22ga Arrow echogenic stimulator needle was used. Sterile prep and drape,hand hygiene and sterile gloves were used.  Negative aspiration and negative test dose prior to incremental administration of local anesthetic. The patient tolerated the procedure well.  Ultrasound guidance: relevent anatomy identified, needle position confirmed, local anesthetic spread visualized around nerve(s), vascular puncture avoided.  Image printed for medical record.

## 2024-04-29 NOTE — Discharge Instructions (Signed)
  Post Anesthesia Home Care Instructions  Activity: Get plenty of rest for the remainder of the day. A responsible individual must stay with you for 24 hours following the procedure.  For the next 24 hours, DO NOT: -Drive a car -Advertising copywriter -Drink alcoholic beverages -Take any medication unless instructed by your physician -Make any legal decisions or sign important papers.  Meals: Start with liquid foods such as gelatin or soup. Progress to regular foods as tolerated. Avoid greasy, spicy, heavy foods. If nausea and/or vomiting occur, drink only clear liquids until the nausea and/or vomiting subsides. Call your physician if vomiting continues.  Special Instructions/Symptoms: Your throat may feel dry or sore from the anesthesia or the breathing tube placed in your throat during surgery. If this causes discomfort, gargle with warm salt water . The discomfort should disappear within 24 hours.  May have Tylenol  after 12:45pm if needed.     Regional Anesthesia Blocks  1. You may not be able to move or feel the "blocked" extremity after a regional anesthetic block. This may last may last from 3-48 hours after placement, but it will go away. The length of time depends on the medication injected and your individual response to the medication. As the nerves start to wake up, you may experience tingling as the movement and feeling returns to your extremity. If the numbness and inability to move your extremity has not gone away after 48 hours, please call your surgeon.   2. The extremity that is blocked will need to be protected until the numbness is gone and the strength has returned. Because you cannot feel it, you will need to take extra care to avoid injury. Because it may be weak, you may have difficulty moving it or using it. You may not know what position it is in without looking at it while the block is in effect.  3. For blocks in the legs and feet, returning to weight bearing and walking  needs to be done carefully. You will need to wait until the numbness is entirely gone and the strength has returned. You should be able to move your leg and foot normally before you try and bear weight or walk. You will need someone to be with you when you first try to ensure you do not fall and possibly risk injury.  4. Bruising and tenderness at the needle site are common side effects and will resolve in a few days.  5. Persistent numbness or new problems with movement should be communicated to the surgeon or the [facility] [phone] [facility] [phone]).

## 2024-04-29 NOTE — Op Note (Signed)
 04/29/2024  8:01 AM  PATIENT:  Lisa Burns    PRE-OPERATIVE DIAGNOSIS:  ULNAR NERVE LESION AT ELBOW, OLECRANON BURSITIS, FOREIGN BODY EXC., RIGHT  POST-OPERATIVE DIAGNOSIS:  Same  PROCEDURE:  ULNAR NERVE DECOMPRESSION, BURSECTOMY, ELBOW, REMOVAL FOREIGN BODY EXTREMITY  SURGEON:  [name], MD  ASSISTANT: Marzella Solan, PA-C, he was present and scrubbed throughout the case, critical for completion in a timely fashion, and for retraction, instrumentation, and closure.   ANESTHESIA:   Regional MAC  PREOPERATIVE INDICATIONS:  [name] is a  63 y.o. female with a diagnosis of ULNAR NERVE LESION AT ELBOW, OLECRANON BURSITIS, FOREIGN BODY EXC., RIGHT who failed conservative measures and elected for surgical management.    The risks benefits and alternatives were discussed with the patient preoperatively including but not limited to the risks of infection, bleeding, nerve injury, cardiopulmonary complications, the need for revision surgery, among others, and the patient was willing to proceed.  OPERATIVE IMPLANTS: None  OPERATIVE FINDINGS: Foreign body  BLOOD LOSS: Minimal  COMPLICATIONS: None  TOURNIQUET TIME: 15 minutes  OPERATIVE PROCEDURE:  Patient was identified in the preoperative holding area and site was marked by me She was transported to the operating theater and placed on the table in supine position taking care to pad all bony prominences. After a preincinduction time out anesthesia was induced. The right upper extremity was prepped and draped in normal sterile fashion and a pre-incision timeout was performed. She received Ancef for preoperative antibiotics.   I made an incision over her medial elbow and dissected to her loose body made sure this was freed from her ulnar nerve performing a ulnar nerve decompression and neuroplasty.    I remove the loose body intact this was 2 pieces of glass.  Next I removed adjacent inflamed olecranon bursa.  This was done with  a pickup and scissors  Incision was thoroughly irrigated and closed with a nylon stitch  POST OPERATIVE PLAN: Dressings for a week sling for comfort

## 2024-04-30 ENCOUNTER — Encounter (HOSPITAL_BASED_OUTPATIENT_CLINIC_OR_DEPARTMENT_OTHER): Payer: Self-pay | Admitting: Orthopedic Surgery

## 2024-04-30 NOTE — Anesthesia Postprocedure Evaluation (Signed)
 Anesthesia Post Note  Patient: Jameela Michna Roll  Procedure(s) Performed: ULNAR NERVE DECOMPRESSION (Right: Elbow) BURSECTOMY, ELBOW (Right: Elbow) REMOVAL FOREIGN BODY EXTREMITY (Right: Elbow)     Patient location during evaluation: PACU Anesthesia Type: Regional and MAC Level of consciousness: awake and alert Pain management: pain level controlled Vital Signs Assessment: post-procedure vital signs reviewed and stable Respiratory status: spontaneous breathing, nonlabored ventilation, respiratory function stable and patient connected to nasal cannula oxygen Cardiovascular status: stable and blood pressure returned to baseline Postop Assessment: no apparent nausea or vomiting Anesthetic complications: no   No notable events documented.  Last Vitals:  Vitals:   04/29/24 0845 04/29/24 0903  BP:  (!) 147/63  Pulse: 61 66  Resp: 16 17  Temp:  (!) 36.3 C  SpO2: 95% 94%    Last Pain:  Vitals:   04/30/24 0841  TempSrc:   PainSc: 4                  Chessa Barrasso S

## 2024-05-09 DIAGNOSIS — G5621 Lesion of ulnar nerve, right upper limb: Secondary | ICD-10-CM | POA: Diagnosis not present

## 2024-06-04 ENCOUNTER — Other Ambulatory Visit: Payer: Self-pay | Admitting: Nurse Practitioner

## 2024-06-04 DIAGNOSIS — S7012XA Contusion of left thigh, initial encounter: Secondary | ICD-10-CM | POA: Diagnosis not present

## 2024-06-04 DIAGNOSIS — M5106 Intervertebral disc disorders with myelopathy, lumbar region: Secondary | ICD-10-CM | POA: Diagnosis not present

## 2024-06-17 ENCOUNTER — Other Ambulatory Visit: Payer: Self-pay | Admitting: Nurse Practitioner

## 2024-06-17 DIAGNOSIS — Z794 Long term (current) use of insulin: Secondary | ICD-10-CM

## 2024-06-20 DIAGNOSIS — G5621 Lesion of ulnar nerve, right upper limb: Secondary | ICD-10-CM | POA: Diagnosis not present

## 2024-06-30 ENCOUNTER — Encounter (HOSPITAL_BASED_OUTPATIENT_CLINIC_OR_DEPARTMENT_OTHER): Payer: Self-pay

## 2024-06-30 ENCOUNTER — Other Ambulatory Visit: Payer: Self-pay | Admitting: Nurse Practitioner

## 2024-06-30 ENCOUNTER — Other Ambulatory Visit (HOSPITAL_BASED_OUTPATIENT_CLINIC_OR_DEPARTMENT_OTHER): Payer: Self-pay

## 2024-07-11 ENCOUNTER — Ambulatory Visit: Admitting: Nurse Practitioner

## 2024-07-23 ENCOUNTER — Other Ambulatory Visit: Payer: Self-pay | Admitting: Nurse Practitioner

## 2024-08-06 DIAGNOSIS — Z01419 Encounter for gynecological examination (general) (routine) without abnormal findings: Secondary | ICD-10-CM | POA: Diagnosis not present

## 2024-08-06 DIAGNOSIS — Z1231 Encounter for screening mammogram for malignant neoplasm of breast: Secondary | ICD-10-CM | POA: Diagnosis not present

## 2024-08-13 ENCOUNTER — Ambulatory Visit: Attending: Cardiology | Admitting: Cardiology

## 2024-08-13 ENCOUNTER — Encounter: Payer: Self-pay | Admitting: Cardiology

## 2024-08-13 VITALS — BP 144/76 | HR 75 | Ht 60.0 in | Wt 212.6 lb

## 2024-08-13 DIAGNOSIS — I10 Essential (primary) hypertension: Secondary | ICD-10-CM | POA: Diagnosis not present

## 2024-08-13 DIAGNOSIS — R011 Cardiac murmur, unspecified: Secondary | ICD-10-CM | POA: Diagnosis not present

## 2024-08-13 DIAGNOSIS — I2510 Atherosclerotic heart disease of native coronary artery without angina pectoris: Secondary | ICD-10-CM | POA: Diagnosis not present

## 2024-08-13 NOTE — Patient Instructions (Signed)
 Medication Instructions:  The current medical regimen is effective;  continue present plan and medications.  *If you need a refill on your cardiac medications before your next appointment, please call your pharmacy*  Follow-Up: At [facility], you and your health needs are our priority.  As part of our continuing mission to provide you with exceptional heart care, our providers are all part of one team.  This team includes your primary Cardiologist (physician) and Advanced Practice Providers or APPs (Physician Assistants and Nurse Practitioners) who all work together to provide you with the care you need, when you need it.  Your next appointment:   1 year(s)  Provider:   One of our Advanced Practice Providers (APPs): Morse Clause, PA-C  Lamarr Satterfield, NP Miriam Shams, NP  Olivia Pavy, PA-C Josefa Beauvais, NP  Leontine Salen, PA-C Orren Fabry, PA-C  [place], PA-C Ernest Dick, NP  Damien Braver, NP Jon Hails, PA-C  Waddell Donath, PA-C    Dayna Dunn, PA-C  Scott Weaver, PA-C Lum Louis, NP Katlyn West, NP Callie Goodrich, PA-C  Xika Zhao, NP Sheng Haley, PA-C    Kathleen Johnson, PA-C       We recommend signing up for the patient portal called MyChart.  Sign up information is provided on this After Visit Summary.  MyChart is used to connect with patients for Virtual Visits (Telemedicine).  Patients are able to view lab/test results, encounter notes, upcoming appointments, etc.  Non-urgent messages can be sent to your provider as well.   To learn more about what you can do with MyChart, go to [email or web address].

## 2024-08-13 NOTE — Progress Notes (Signed)
 Cardiology Office Note:  .   Date:  08/13/2024  ID:  Lisa Burns, DOB May 26, 1961, MRN [id number] PCP: Shona Norleen PEDLAR, MD  [facility] Providers Cardiologist:  Oneil Parchment, MD     History of Present Illness: .   Lisa Burns is a 63 y.o. female Discussed the use of AI scribe software   History of Present Illness Lisa Burns is a 63 year old female with coronary artery disease who presents for follow-up.  She has a history of coronary artery disease with a prior coronary CT scan on May 17, 2022, showing a calcium  score of 13, moderate proximal RCA stenosis, and severe stenosis of the mid first diagonal branch ranging from 70% to 99%. She is currently managed medically with aspirin  81 mg, Repatha  every two weeks, Toprol  25 mg, and isosorbide  30 mg. Her LDL is 21 with Repatha . She experiences dyspnea on exertion, particularly when climbing a flight of stairs.  She also has diabetes, with an A1c of 7, and is on Lantus  and Mounjaro  for management. She takes enalapril  20 mg for blood pressure and kidney protection, and furosemide  for fluid management.  She has a family history of cardiovascular disease, with her father having a heart attack in his late forties, and both her brother and sister having bypass surgeries in their forties. Her sister died at age 85.       Studies Reviewed: SABRA   EKG Interpretation Date/Time:  Wednesday August 13 2024 09:54:33 EDT Ventricular Rate:  75 PR Interval:  160 QRS Duration:  84 QT Interval:  410 QTC Calculation: 457 R Axis:   9  Text Interpretation: Normal sinus rhythm Left ventricular hypertrophy with repolarization abnormality ( R in aVL ) Cannot rule out Inferior infarct (cited on or before 13-Aug-2024) When compared with ECG of 13-Aug-2024 09:53, Suspect limb reversal is no longer Criteria for Lateral infarct are no longer Present Questionable change in initial forces of Inferior leads Non-specific change in ST segment in Lateral  leads Confirmed by Parchment Oneil ([id number]) on 08/13/2024 10:05:34 AM    Results LABS LDL: 21 A1c: 7  RADIOLOGY Coronary CT: Calcium  score 13, moderate proximal RCA stenosis, severe stenosis of mid first diagonal branch 70-99%, mild aortic valve calcifications score 53 (05/17/2022)  DIAGNOSTIC REPORTS ECG: Normal Risk Assessment/Calculations:           Physical Exam:   VS:  BP (!) 144/76   Pulse 75   Ht 5' (1.524 m)   Wt 212 lb 9.6 oz (96.4 kg)   SpO2 93%   BMI 41.52 kg/m    Wt Readings from Last 3 Encounters:  08/13/24 212 lb 9.6 oz (96.4 kg)  04/29/24 210 lb 1.6 oz (95.3 kg)  03/11/24 214 lb (97.1 kg)    GEN: Well nourished, well developed in no acute distress NECK: No JVD; No carotid bruits CARDIAC: RRR, no murmurs, no rubs, no gallops RESPIRATORY:  Clear to auscultation without rales, wheezing or rhonchi  ABDOMEN: Soft, non-tender, non-distended EXTREMITIES:  No edema; No deformity   ASSESSMENT AND PLAN: .    Assessment and Plan Assessment & Plan Coronary artery disease with prior moderate RCA and severe D1 stenosis Coronary artery disease with prior coronary CT showing a calcium  score of 13, moderate proximal RCA stenosis, and severe stenosis of the mid first diagonal branch (70-99%). FFR positive. Currently managed medically with aspirin , Repatha , Toprol , and isosorbide . Reports getting winded when climbing stairs, which is not uncommon. Medical management is  preferred due to the small size of the branch and lack of significant symptoms. Repatha  has effectively reduced LDL to 28, providing significant protection against heart attack, especially given family history of early cardiac events. - Continue aspirin  81 mg daily - Continue Repatha  every two weeks - Continue Toprol  25 mg daily - Continue isosorbide  30 mg daily  Hyperlipidemia Hyperlipidemia well-managed with Repatha , resulting in LDL reduction to 28. This is highly protective against cardiovascular events. -  Continue Repatha  every two weeks  Type 2 diabetes mellitus Type 2 diabetes mellitus with A1c of 7, indicating good control. Managed with Lantus  and Mounjaro . Discussion about Mounjaro 's role in diabetes management rather than weight loss. - Continue Lantus  as prescribed - Continue Mounjaro  as prescribed  Hypertension Hypertension under good control with current medication regimen. Blood pressure slightly elevated today, possibly due to situational factors such as being in a new building. Enalapril  and furosemide  are part of the regimen for blood pressure control and kidney protection. - Continue enalapril  20 mg daily - Continue furosemide  as prescribed - Monitor blood pressure at home regularly  Aortic valve calcification Mild aortic valve calcifications noted on prior CT with a score of 53.          Signed, Oneil Parchment, MD

## 2024-08-18 ENCOUNTER — Other Ambulatory Visit: Payer: Self-pay | Admitting: Nurse Practitioner

## 2024-09-16 ENCOUNTER — Other Ambulatory Visit: Payer: Self-pay | Admitting: Nurse Practitioner

## 2024-09-16 DIAGNOSIS — E119 Type 2 diabetes mellitus without complications: Secondary | ICD-10-CM

## 2024-09-29 DIAGNOSIS — R0989 Other specified symptoms and signs involving the circulatory and respiratory systems: Secondary | ICD-10-CM | POA: Diagnosis not present

## 2024-09-29 DIAGNOSIS — H9209 Otalgia, unspecified ear: Secondary | ICD-10-CM | POA: Diagnosis not present

## 2024-09-29 DIAGNOSIS — R051 Acute cough: Secondary | ICD-10-CM | POA: Diagnosis not present

## 2024-09-30 ENCOUNTER — Other Ambulatory Visit: Payer: Self-pay | Admitting: Nurse Practitioner

## 2024-09-30 ENCOUNTER — Other Ambulatory Visit (HOSPITAL_BASED_OUTPATIENT_CLINIC_OR_DEPARTMENT_OTHER): Payer: Self-pay

## 2024-09-30 DIAGNOSIS — Z794 Long term (current) use of insulin: Secondary | ICD-10-CM

## 2024-10-01 ENCOUNTER — Other Ambulatory Visit (HOSPITAL_BASED_OUTPATIENT_CLINIC_OR_DEPARTMENT_OTHER): Payer: Self-pay

## 2024-10-01 MED ORDER — MOUNJARO 5 MG/0.5ML ~~LOC~~ SOAJ
5.0000 mg | SUBCUTANEOUS | 1 refills | Status: DC
  Filled 2024-10-01: qty 6, 84d supply, fill #0

## 2024-10-08 ENCOUNTER — Ambulatory Visit: Admitting: Nurse Practitioner

## 2024-10-08 ENCOUNTER — Other Ambulatory Visit (HOSPITAL_BASED_OUTPATIENT_CLINIC_OR_DEPARTMENT_OTHER): Payer: Self-pay

## 2024-10-08 ENCOUNTER — Encounter: Payer: Self-pay | Admitting: Nurse Practitioner

## 2024-10-08 VITALS — BP 120/70 | HR 75 | Ht 60.0 in | Wt 214.8 lb

## 2024-10-08 DIAGNOSIS — I10 Essential (primary) hypertension: Secondary | ICD-10-CM

## 2024-10-08 DIAGNOSIS — E782 Mixed hyperlipidemia: Secondary | ICD-10-CM | POA: Diagnosis not present

## 2024-10-08 DIAGNOSIS — Z794 Long term (current) use of insulin: Secondary | ICD-10-CM

## 2024-10-08 DIAGNOSIS — E119 Type 2 diabetes mellitus without complications: Secondary | ICD-10-CM

## 2024-10-08 LAB — POCT GLYCOSYLATED HEMOGLOBIN (HGB A1C): Hemoglobin A1C: 8.7 % — AB (ref 4.0–5.6)

## 2024-10-08 MED ORDER — MOUNJARO 5 MG/0.5ML ~~LOC~~ SOAJ
5.0000 mg | SUBCUTANEOUS | 1 refills | Status: AC
  Filled 2024-10-15 (×2): qty 6, 84d supply, fill #0

## 2024-10-08 NOTE — Progress Notes (Signed)
 10/08/2024                     Endocrinology follow-up note   Subjective:    Patient ID: Lisa Burns, female    DOB: July 11, 1961, PCP Shona Norleen PEDLAR, MD   Past Medical History:  Diagnosis Date   Acute pharyngitis    Allergy    Anemia    Arthritis    Asthma    Constipation    Coronary artery disease    Cough    Diabetes mellitus with neuropathy (HCC)    Diabetes mellitus without complication (HCC)    GERD (gastroesophageal reflux disease)    Heart murmur    Heart murmur    History of hiatal hernia    Sx in 2000   Hyperlipidemia    Hypertension    Microproteinuria    Neuropathy    Obesity    OSA (obstructive sleep apnea)    Proteinuria    Reflux    Snoring    Ulcer of mouth    URI (upper respiratory infection)    Venous stasis    Past Surgical History:  Procedure Laterality Date   CESAREAN SECTION  1986   COLONOSCOPY N/A 04/24/2016   Procedure: COLONOSCOPY;  Surgeon: Margo LITTIE Haddock, MD;  Location: [facility];  Service: Endoscopy;  Laterality: N/A;  8:30 Am   COLONOSCOPY N/A 07/18/2019   Dr. Haddock: 6 simple adenomas removed.  External/internal hemorrhoids.  Next colonoscopy in 3 years.   FOREIGN BODY REMOVAL Right 04/29/2024   Procedure: REMOVAL FOREIGN BODY EXTREMITY;  Surgeon: Beverley Evalene BIRCH, MD;  Location: [facility];  Service: Orthopedics;  Laterality: Right;   LAPAROSCOPIC NISSEN FUNDOPLICATION  2000   LUMBAR LAMINECTOMY/ DECOMPRESSION WITH MET-RX Left 05/16/2023   Procedure: LUMBAR THREE-LUMBAR FOUR LAMINOTOMY, MEDICAL FACETECTOMY AND FORAMINOTOMY;  Surgeon: Debby Dorn MATSU, MD;  Location: [facility];  Service: Neurosurgery;  Laterality: Left;  3C   OLECRANON BURSECTOMY Right 04/29/2024   Procedure: BURSECTOMY, ELBOW;  Surgeon: Beverley Evalene BIRCH, MD;  Location: [facility];  Service: Orthopedics;  Laterality: Right;   OTHER SURGICAL HISTORY     gerd surgery   POLYPECTOMY  07/18/2019   Procedure: POLYPECTOMY;  Surgeon:  Haddock Margo LITTIE, MD;  Location: [facility];  Service: Endoscopy;;   THROAT SURGERY  2000   TONSILLECTOMY     As a child   ULNAR NERVE TRANSPOSITION Right 04/29/2024   Procedure: ULNAR NERVE DECOMPRESSION;  Surgeon: Beverley Evalene BIRCH, MD;  Location: [facility];  Service: Orthopedics;  Laterality: Right;   Social History   Socioeconomic History   Marital status: Married    Spouse name: Not on file   Number of children: 1   Years of education: HS   Highest education level: Not on file  Occupational History   Occupation: [employer]   Tobacco Use   Smoking status: Former    Current packs/day: 0.00    Average packs/day: 1 pack/day for 5.0 years (5.0 ttl pk-yrs)    Types: Cigarettes    Start date: 34    Quit date: 2004    Years since quitting: 21.8   Smokeless tobacco: Never  Vaping Use   Vaping status: Never Used  Substance and Sexual Activity   Alcohol use: Yes    Alcohol/week: 0.0 standard drinks of alcohol    Comment: occasionally   Drug use: No   Sexual activity: Yes    Birth control/protection:  None  Other Topics Concern   Not on file  Social History Narrative   Drinks 2 cans of soda a day    Social Drivers of Corporate Investment Banker Strain: Not on file  Food Insecurity: Not on file  Transportation Needs: Not on file  Physical Activity: Not on file  Stress: Not on file  Social Connections: Not on file   Outpatient Encounter Medications as of 10/08/2024  Medication Sig   AIRSUPRA 90-80 MCG/ACT AERO    aspirin  EC 81 MG tablet Take 1 tablet (81 mg total) by mouth daily. Swallow whole.   augmented betamethasone dipropionate (DIPROLENE-AF) 0.05 % cream Apply 1 Application topically 2 (two) times daily.   Azelaic Acid  15 % cream Apply 1 application topically daily. Apply to face   clobetasol  (TEMOVATE ) 0.05 % external solution Apply 1 application  topically 2 (two) times daily as needed (psoriasis).   cyclobenzaprine  (FLEXERIL ) 10 MG tablet  Take 10 mg by mouth 3 (three) times daily as needed for muscle spasms.   enalapril  (VASOTEC ) 20 MG tablet TAKE ONE TABLET (20MG  TOTAL) BY MOUTH ATBEDTIME   fluticasone  (FLONASE ) 50 MCG/ACT nasal spray Place 1 spray into both nostrils daily.   furosemide  (LASIX ) 40 MG tablet TAKE ONE TABLET (40MG  TOTAL) BY MOUTH EVERY MORNING   glucose blood test strip Use as instructed   isosorbide  mononitrate (IMDUR ) 30 MG 24 hr tablet Take 1 tablet (30 mg total) by mouth daily.   lansoprazole  (PREVACID ) 15 MG capsule Take 1 capsule (15 mg total) by mouth daily.   LANTUS  SOLOSTAR 100 UNIT/ML Solostar Pen INJECT 50 UNITS INTO THE SKIN AT BEDTIME   loratadine (CLARITIN) 10 MG tablet Take 10 mg by mouth daily.    meloxicam  (MOBIC ) 15 MG tablet Take 1 tablet (15 mg total) by mouth daily as needed for pain (and inflammation).   metoprolol  succinate (TOPROL  XL) 25 MG 24 hr tablet Take 1 tablet (25 mg total) by mouth daily.   NON FORMULARY Pt uses a cpap nightly   nortriptyline  (PAMELOR ) 50 MG capsule TAKE TWO CAPSULES BY MOUTH AT BEDTIME   potassium chloride  SA (KLOR-CON  M) 20 MEQ tablet Take 20 mEq by mouth daily.   REPATHA  SURECLICK 140 MG/ML SOAJ INJECT 140MG  INTO THE SKIN EVERY 14 DAYS   TRELEGY ELLIPTA 100-62.5-25 MCG/ACT AEPB    valACYclovir  (VALTREX ) 500 MG tablet Take 500 mg by mouth 2 (two) times daily as needed (cold sore flare ups).    albuterol  (PROAIR  HFA) 108 (90 Base) MCG/ACT inhaler INHALE TWO PUFFS INTO THE LUNGS EVERY SIX HOURS AS NEEDED   fluticasone  (FLOVENT  HFA) 110 MCG/ACT inhaler INHALE TWO PUFFS INTO THE LUNGS TWICE DAILY (Patient not taking: Reported on 09/19/2023)   gabapentin  (NEURONTIN ) 400 MG capsule TAKE ONE (1) CAPSULE THREE (3) TIMES EACH DAY (Patient taking differently: Take 400 mg by mouth 3 (three) times daily as needed (pain).)   HYDROcodone -acetaminophen  (NORCO) 10-325 MG tablet Take 1 tablet by mouth every 6 (six) hours as needed for severe pain (pain score 7-10).   hydrOXYzine  (ATARAX) 10 MG tablet Take 1 tablet twice a day by oral route as needed.   ondansetron  (ZOFRAN -ODT) 4 MG disintegrating tablet Take 1 tablet (4 mg total) by mouth every 8 (eight) hours as needed for nausea or vomiting.   tirzepatide  (MOUNJARO ) 5 MG/0.5ML Pen Inject 5 mg into the skin once a week.   [DISCONTINUED] tirzepatide  (MOUNJARO ) 5 MG/0.5ML Pen Inject 5 mg into the skin once a week. (  Patient not taking: Reported on 10/08/2024)   No facility-administered encounter medications on file as of 10/08/2024.   ALLERGIES: Allergies  Allergen Reactions   Augmentin [Amoxicillin-Pot Clavulanate]     GI upset Did it involve swelling of the face/tongue/throat, SOB, or low BP? No Did it involve sudden or severe rash/hives, skin peeling, or any reaction on the inside of your mouth or nose? No Did you need to seek medical attention at a hospital or doctor's office? No When did it last happen?      15 years If all above answers are "NO", may proceed with cephalosporin use.    Cinnamon Hives and Swelling   Coconut Flavoring Agent (Non-Screening) Hives and Swelling   Januvia  [Sitagliptin ]     GI upset   Metformin Diarrhea    GI Upset   Pravastatin      Pain in joints   Strawberry (Diagnostic) Hives and Swelling   VACCINATION STATUS: Immunization History  Administered Date(s) Administered   Influenza, Seasonal, Injecte, Preservative Fre 08/09/2016   Influenza,inj,Quad PF,6+ Mos 12/27/2017   PFIZER(Purple Top)SARS-COV-2 Vaccination 03/11/2020, 04/11/2020   Pneumococcal Polysaccharide-23 12/30/2013    Diabetes She presents for her follow-up diabetic visit. She has type 2 diabetes mellitus. Onset time: She was diagnosed at approximate age of 48 years. Her disease course has been improving. Hypoglycemia symptoms include nervousness/anxiousness and tremors. Pertinent negatives for hypoglycemia include no confusion, pallor or seizures. Associated symptoms include weight loss. Pertinent negatives for  diabetes include no polydipsia, no polyphagia and no polyuria. Hypoglycemia complications include nocturnal hypoglycemia. Symptoms are stable. There are no diabetic complications. Risk factors for coronary artery disease include diabetes mellitus, dyslipidemia, family history, hypertension, obesity, sedentary lifestyle and tobacco exposure. Current diabetic treatment includes insulin  injections (and Mounjaro ). She is compliant with treatment most of the time. Her weight is decreasing steadily. She is following a generally healthy diet. When asked about meal planning, she reported none. She has not had a previous visit with a dietitian. She never participates in exercise. Her home blood glucose trend is decreasing steadily. Her breakfast blood glucose range is generally 140-180 mg/dl. (She presents today with her logs showing above target glycemic profile overall.  Her POCT A1c today is 8.7%, increasing from last visit of 7%.  She has been out of Mounjaro  for about 2 weeks now, changed insurances to [facility] and is working on trying to find out how to afford it.  She was also recently on steroids for URI.  Analysis of her meter shows 14 and 30-day averages of 166.) An ACE inhibitor/angiotensin II receptor blocker is being taken. She sees a podiatrist.Eye exam is current.    Review of systems  Constitutional: + increasing body weight,  current Body mass index is 41.95 kg/m. , no fatigue, no subjective hyperthermia, no subjective hypothermia Eyes: no blurry vision, no xerophthalmia ENT: no sore throat, no nodules palpated in throat, no dysphagia/odynophagia, no hoarseness Cardiovascular: no chest pain, no shortness of breath, no palpitations, no leg swelling Respiratory: no cough, no shortness of breath Gastrointestinal: no nausea/vomiting/diarrhea Musculoskeletal: no muscle/joint aches Skin: no rashes, no hyperemia Neurological: no tremors, no numbness, no tingling, no dizziness Psychiatric: no  depression, no anxiety    Objective:    BP 120/70 (BP Location: Left Arm, Patient Position: Sitting, Cuff Size: Large)   Pulse 75   Ht 5' (1.524 m)   Wt 214 lb 12.8 oz (97.4 kg)   BMI 41.95 kg/m   Wt Readings from Last 3 Encounters:  10/08/24  214 lb 12.8 oz (97.4 kg)  08/13/24 212 lb 9.6 oz (96.4 kg)  04/29/24 210 lb 1.6 oz (95.3 kg)    BP Readings from Last 3 Encounters:  10/08/24 120/70  08/13/24 (!) 144/76  04/29/24 (!) 147/63     Physical Exam- Limited  Constitutional:  Body mass index is 41.95 kg/m. , not in acute distress, normal state of mind Eyes:  EOMI, no exophthalmos Neck: Supple Musculoskeletal: no gross deformities, strength intact in all four extremities, no gross restriction of joint movements Skin:  no rashes, no hyperemia Neurological: no tremor with outstretched hands  Diabetic Foot Exam - Simple   No data filed     Results for orders placed or performed in visit on 10/08/24  HgB A1c   Collection Time: 10/08/24  4:22 PM  Result Value Ref Range   Hemoglobin A1C 8.7 (A) 4.0 - 5.6 %   HbA1c POC (<> result, manual entry)     HbA1c, POC (prediabetic range)     HbA1c, POC (controlled diabetic range)     Diabetic Labs (most recent): Lab Results  Component Value Date   HGBA1C 8.7 (A) 10/08/2024   HGBA1C 7.0 03/06/2024   HGBA1C 10.8 09/10/2023   MICROALBUR 10.0 03/30/2022   MICROALBUR 10 07/11/2021   MICROALBUR 10 09/06/2020   Lipid Panel     Component Value Date/Time   CHOL 122 09/10/2023 0000   TRIG 147 09/10/2023 0000   HDL 56 09/10/2023 0000   CHOLHDL 1.9 07/07/2021 0720   VLDL 43 (H) 11/29/2016 0729   LDLCALC 41 09/10/2023 0000   LDLCALC 34 07/07/2021 0720     Assessment & Plan:   1) Type 2 Diabetes Mellitus without complication with long-term current use of insulin   She presents today with her logs showing above target glycemic profile overall.  Her POCT A1c today is 8.7%, increasing from last visit of 7%.  She has been out of  Mounjaro  for about 2 weeks now, changed insurances to [facility] and is working on trying to find out how to afford it.  She was also recently on steroids for URI.  Analysis of her meter shows 14 and 30-day averages of 166.  - She remains at a high risk for more acute and chronic complications of diabetes which include CAD, CVA, CKD, retinopathy, and neuropathy. These are all discussed in detail with the patient.   - Recent labs reviewed.  - Nutritional counseling repeated/built upon at each appointment.  - The patient admits there is a room for improvement in their diet and drink choices. -  Suggestion is made for the patient to avoid simple carbohydrates from their diet including Cakes, Sweet Desserts / Pastries, Ice Cream, Soda (diet and regular), Sweet Tea, Candies, Chips, Cookies, Sweet Pastries, Store Bought Juices, Alcohol in Excess of 1-2 drinks a day, Artificial Sweeteners, Coffee Creamer, and Sugar-free Products. This will help patient to have stable blood glucose profile and potentially avoid unintended weight gain.   - I encouraged the patient to switch to unprocessed or minimally processed complex starch and increased protein intake (animal or plant source), fruits, and vegetables.   - Patient is advised to stick to a routine mealtimes to eat 3 meals a day and avoid unnecessary snacks (to snack only to correct hypoglycemia).  - I have approached patient with the following individualized plan to manage diabetes and patient agrees.  -She is advised to lower Lantus  to 50 units SQ nightly and continue Mounjaro  5 mg SQ weekly (may  increase this on subsequent visits.   -From an endocrinology standpoint, she is stable enough for surgery.  -She is encouraged to continue monitoring blood glucose at least twice daily, before breakfast and before bed, and call the clinic if she has readings less than 70 or greater than 200 for 3 tests in a row.  - Patient specific target  for A1c; LDL, HDL,  Triglycerides, and  Waist Circumference were discussed in detail.  2) BP/HTN: Her blood pressure is controlled to target today.  She is advised to continue her current meds as prescribed by PCP.    3) Lipids/HPL:  Her recent lipid panel from 03/06/24 shows controlled LDL of 28.  She does not tolerate statins.  She is advised to continue Repatha  140 mg SQ every 2 weeks.    4)  Weight/Diet:  Her Body mass index is 41.95 kg/m.--  She is a candidate for modest weight loss.  Exercise, and carbohydrates information provided.  5) Chronic Care/Health Maintenance: -Patient is on ACEI/ARB medications and encouraged to continue to follow up with Ophthalmology, Podiatrist at least yearly or according to recommendations, and advised to  stay away from smoking. I have recommended yearly flu vaccine and pneumonia vaccination at least every 5 years; moderate intensity exercise for up to 150 minutes weekly; and  sleep for at least 7 hours a day.  - I advised patient to maintain close follow up with Shona Norleen PEDLAR, MD for primary care needs.     I spent  32  minutes in the care of the patient today including review of labs from CMP, Lipids, Thyroid  Function, Hematology (current and previous including abstractions from other facilities); face-to-face time discussing  her blood glucose readings/logs, discussing hypoglycemia and hyperglycemia episodes and symptoms, medications doses, her options of short and long term treatment based on the latest standards of care / guidelines;  discussion about incorporating lifestyle medicine;  and documenting the encounter. Risk reduction counseling performed per USPSTF guidelines to reduce obesity and cardiovascular risk factors.     Please refer to Patient Instructions for Blood Glucose Monitoring and Insulin /Medications Dosing Guide  in media tab for additional information. Please  also refer to  Patient Self Inventory in the Media  tab for reviewed elements of pertinent  patient history.  Lisa Burns participated in the discussions, expressed understanding, and voiced agreement with the above plans.  All questions were answered to her satisfaction. she is encouraged to contact clinic should she have any questions or concerns prior to her return visit.    Follow up plan: -Return in about 4 months (around 02/05/2025) for Diabetes F/U with A1c in office, No previsit labs, Bring meter and logs.   Benton Rio, [facility] [address] [place], [place] [address] Phone: [phone] Fax: [phone]  10/08/2024, 4:33 PM

## 2024-10-10 DIAGNOSIS — J302 Other seasonal allergic rhinitis: Secondary | ICD-10-CM | POA: Diagnosis not present

## 2024-10-10 DIAGNOSIS — G4733 Obstructive sleep apnea (adult) (pediatric): Secondary | ICD-10-CM | POA: Diagnosis not present

## 2024-10-10 DIAGNOSIS — I10 Essential (primary) hypertension: Secondary | ICD-10-CM | POA: Diagnosis not present

## 2024-10-10 DIAGNOSIS — E1140 Type 2 diabetes mellitus with diabetic neuropathy, unspecified: Secondary | ICD-10-CM | POA: Diagnosis not present

## 2024-10-10 DIAGNOSIS — E876 Hypokalemia: Secondary | ICD-10-CM | POA: Diagnosis not present

## 2024-10-10 DIAGNOSIS — F439 Reaction to severe stress, unspecified: Secondary | ICD-10-CM | POA: Diagnosis not present

## 2024-10-10 DIAGNOSIS — E119 Type 2 diabetes mellitus without complications: Secondary | ICD-10-CM | POA: Diagnosis not present

## 2024-10-10 DIAGNOSIS — E785 Hyperlipidemia, unspecified: Secondary | ICD-10-CM | POA: Diagnosis not present

## 2024-10-13 ENCOUNTER — Other Ambulatory Visit (HOSPITAL_COMMUNITY): Payer: Self-pay

## 2024-10-13 DIAGNOSIS — Z13820 Encounter for screening for osteoporosis: Secondary | ICD-10-CM

## 2024-10-15 ENCOUNTER — Other Ambulatory Visit (HOSPITAL_COMMUNITY): Payer: Self-pay

## 2024-10-15 ENCOUNTER — Other Ambulatory Visit (HOSPITAL_BASED_OUTPATIENT_CLINIC_OR_DEPARTMENT_OTHER): Payer: Self-pay

## 2024-10-22 ENCOUNTER — Other Ambulatory Visit (HOSPITAL_COMMUNITY): Payer: Self-pay

## 2024-10-22 ENCOUNTER — Telehealth: Payer: Self-pay

## 2024-10-22 NOTE — Telephone Encounter (Signed)
 Patient was called and made aware.

## 2024-10-22 NOTE — Telephone Encounter (Signed)
 Pharmacy Patient Advocate Encounter   Received notification from CoverMyMeds that prior authorization for Mounjaro  5mg /0.84ml is required/requested.   Insurance verification completed.   The patient is insured through [employer].   Per test claim: PA required and submitted KEY/EOC/Request #: [id number] from 10/01/2024 to 11/19/2025

## 2024-10-24 ENCOUNTER — Other Ambulatory Visit (HOSPITAL_COMMUNITY)

## 2024-10-31 ENCOUNTER — Other Ambulatory Visit (HOSPITAL_COMMUNITY)

## 2024-10-31 ENCOUNTER — Encounter (HOSPITAL_COMMUNITY): Payer: Self-pay

## 2024-10-31 ENCOUNTER — Ambulatory Visit (HOSPITAL_COMMUNITY): Admission: RE | Admit: 2024-10-31 | Discharge: 2024-10-31 | Disposition: A | Source: Ambulatory Visit

## 2024-10-31 DIAGNOSIS — M8589 Other specified disorders of bone density and structure, multiple sites: Secondary | ICD-10-CM | POA: Insufficient documentation

## 2024-10-31 DIAGNOSIS — Z13820 Encounter for screening for osteoporosis: Secondary | ICD-10-CM

## 2024-12-01 ENCOUNTER — Other Ambulatory Visit: Payer: Self-pay | Admitting: Nurse Practitioner

## 2024-12-05 ENCOUNTER — Other Ambulatory Visit: Payer: Self-pay | Admitting: Nurse Practitioner

## 2024-12-05 DIAGNOSIS — E119 Type 2 diabetes mellitus without complications: Secondary | ICD-10-CM

## 2025-02-05 ENCOUNTER — Ambulatory Visit: Admitting: Nurse Practitioner
